# Patient Record
Sex: Female | Born: 1935 | Hispanic: No | Marital: Single | Smoking: Former smoker
Health system: Southern US, Community
[De-identification: ages and names within clinical notes are randomized; demographics above are authoritative.]

## PROBLEM LIST (undated history)

## (undated) DIAGNOSIS — I350 Nonrheumatic aortic (valve) stenosis: Secondary | ICD-10-CM

## (undated) DIAGNOSIS — D649 Anemia, unspecified: Secondary | ICD-10-CM

## (undated) DIAGNOSIS — I5030 Unspecified diastolic (congestive) heart failure: Secondary | ICD-10-CM

## (undated) DIAGNOSIS — E118 Type 2 diabetes mellitus with unspecified complications: Secondary | ICD-10-CM

## (undated) DIAGNOSIS — I10 Essential (primary) hypertension: Secondary | ICD-10-CM

## (undated) DIAGNOSIS — M1990 Unspecified osteoarthritis, unspecified site: Secondary | ICD-10-CM

## (undated) DIAGNOSIS — Z8711 Personal history of peptic ulcer disease: Secondary | ICD-10-CM

## (undated) DIAGNOSIS — Z8719 Personal history of other diseases of the digestive system: Secondary | ICD-10-CM

## (undated) DIAGNOSIS — J45909 Unspecified asthma, uncomplicated: Secondary | ICD-10-CM

## (undated) DIAGNOSIS — C679 Malignant neoplasm of bladder, unspecified: Secondary | ICD-10-CM

## (undated) DIAGNOSIS — I2510 Atherosclerotic heart disease of native coronary artery without angina pectoris: Secondary | ICD-10-CM

## (undated) DIAGNOSIS — J8410 Pulmonary fibrosis, unspecified: Secondary | ICD-10-CM

## (undated) HISTORY — PX: CAROTID ENDARTERECTOMY: SUR193

## (undated) HISTORY — PX: CHOLECYSTECTOMY: SHX55

## (undated) HISTORY — PX: EYE SURGERY: SHX253

## (undated) HISTORY — PX: CARDIAC VALVE REPLACEMENT: SHX585

## (undated) HISTORY — PX: CORONARY ARTERY BYPASS GRAFT: SHX141

## (undated) HISTORY — PX: AORTIC VALVE REPLACEMENT (AVR)/CORONARY ARTERY BYPASS GRAFTING (CABG): SHX5725

---

## 2003-03-08 ENCOUNTER — Other Ambulatory Visit: Payer: Self-pay

## 2004-03-08 ENCOUNTER — Ambulatory Visit: Payer: Self-pay | Admitting: Internal Medicine

## 2004-08-10 ENCOUNTER — Emergency Department: Payer: Self-pay | Admitting: Emergency Medicine

## 2005-02-28 ENCOUNTER — Ambulatory Visit: Payer: Self-pay | Admitting: Internal Medicine

## 2006-06-14 ENCOUNTER — Ambulatory Visit: Payer: Self-pay | Admitting: Internal Medicine

## 2006-09-13 ENCOUNTER — Ambulatory Visit: Payer: Self-pay | Admitting: Internal Medicine

## 2006-11-15 ENCOUNTER — Inpatient Hospital Stay: Payer: Self-pay | Admitting: Vascular Surgery

## 2007-03-18 ENCOUNTER — Other Ambulatory Visit: Payer: Self-pay

## 2007-03-18 ENCOUNTER — Emergency Department: Payer: Self-pay | Admitting: Emergency Medicine

## 2008-01-09 ENCOUNTER — Ambulatory Visit: Payer: Self-pay | Admitting: Ophthalmology

## 2008-01-09 ENCOUNTER — Ambulatory Visit: Payer: Self-pay | Admitting: Cardiology

## 2008-01-21 ENCOUNTER — Ambulatory Visit: Payer: Self-pay | Admitting: Ophthalmology

## 2008-03-01 ENCOUNTER — Emergency Department: Payer: Self-pay | Admitting: Emergency Medicine

## 2008-03-04 ENCOUNTER — Ambulatory Visit: Payer: Self-pay | Admitting: Unknown Physician Specialty

## 2008-03-05 ENCOUNTER — Inpatient Hospital Stay: Payer: Self-pay | Admitting: Unknown Physician Specialty

## 2008-04-06 ENCOUNTER — Emergency Department: Payer: Self-pay | Admitting: Emergency Medicine

## 2008-06-08 ENCOUNTER — Emergency Department: Payer: Self-pay | Admitting: Emergency Medicine

## 2008-09-08 ENCOUNTER — Emergency Department: Payer: Self-pay | Admitting: Emergency Medicine

## 2009-04-06 ENCOUNTER — Emergency Department: Payer: Self-pay | Admitting: Emergency Medicine

## 2009-11-04 ENCOUNTER — Emergency Department: Payer: Self-pay | Admitting: Emergency Medicine

## 2011-06-09 ENCOUNTER — Emergency Department: Payer: Self-pay | Admitting: *Deleted

## 2011-06-09 LAB — URINALYSIS, COMPLETE
Bacteria: NONE SEEN
Bilirubin,UR: NEGATIVE
Ketone: NEGATIVE
Nitrite: NEGATIVE
Ph: 6 (ref 4.5–8.0)
Protein: NEGATIVE
Specific Gravity: 1.004 (ref 1.003–1.030)
Squamous Epithelial: <1
WBC UR: <1 /HPF (ref 0–5)

## 2011-06-09 LAB — COMPREHENSIVE METABOLIC PANEL
Alkaline Phosphatase: 145 U/L — ABNORMAL HIGH (ref 50–136)
Anion Gap: 8 (ref 7–16)
BUN: 16 mg/dL (ref 7–18)
Bilirubin,Total: 0.4 mg/dL (ref 0.2–1.0)
Calcium, Total: 8.8 mg/dL (ref 8.5–10.1)
Chloride: 101 mmol/L (ref 98–107)
Co2: 32 mmol/L (ref 21–32)
Creatinine: 0.87 mg/dL (ref 0.60–1.30)
EGFR (African American): >60
Osmolality: 283 (ref 275–301)
Potassium: 3.7 mmol/L (ref 3.5–5.1)
SGPT (ALT): 17 U/L
Total Protein: 8.3 g/dL — ABNORMAL HIGH (ref 6.4–8.2)

## 2011-06-09 LAB — CBC
HCT: 37.2 % (ref 35.0–47.0)
MCH: 29.6 pg (ref 26.0–34.0)
Platelet: 96 10*3/uL — ABNORMAL LOW (ref 150–440)
RBC: 4.1 10*6/uL (ref 3.80–5.20)
WBC: 8.2 10*3/uL (ref 3.6–11.0)

## 2011-11-14 ENCOUNTER — Ambulatory Visit: Payer: Self-pay | Admitting: Ophthalmology

## 2011-11-28 ENCOUNTER — Ambulatory Visit: Payer: Self-pay | Admitting: Ophthalmology

## 2012-01-05 ENCOUNTER — Emergency Department: Payer: Self-pay | Admitting: Emergency Medicine

## 2012-01-05 LAB — COMPREHENSIVE METABOLIC PANEL
Anion Gap: 10 (ref 7–16)
BUN: 20 mg/dL — ABNORMAL HIGH (ref 7–18)
Calcium, Total: 7.8 mg/dL — ABNORMAL LOW (ref 8.5–10.1)
Creatinine: 1.23 mg/dL (ref 0.60–1.30)
EGFR (African American): 46 — ABNORMAL LOW
Glucose: 181 mg/dL — ABNORMAL HIGH (ref 65–99)
Potassium: 3.8 mmol/L (ref 3.5–5.1)
SGPT (ALT): 16 U/L (ref 12–78)
Sodium: 135 mmol/L — ABNORMAL LOW (ref 136–145)

## 2012-01-05 LAB — CBC
HGB: 10.9 g/dL — ABNORMAL LOW (ref 12.0–16.0)
MCH: 29.3 pg (ref 26.0–34.0)
MCHC: 33.2 g/dL (ref 32.0–36.0)
MCV: 88 fL (ref 80–100)
Platelet: 101 10*3/uL — ABNORMAL LOW (ref 150–440)
RDW: 15.7 % — ABNORMAL HIGH (ref 11.5–14.5)

## 2014-04-22 NOTE — Op Note (Signed)
PATIENT NAME:  Ariana Miller, Ariana Miller MR#:  [id number] DATE OF BIRTH:  1924/01/01  DATE OF PROCEDURE:  11/28/2011  PREOPERATIVE DIAGNOSIS:  Cataract, left eye.    POSTOPERATIVE DIAGNOSIS:  Cataract, left eye.  PROCEDURE PERFORMED:  Extracapsular cataract extraction using phacoemulsification with placement of an Alcon [id number], 23.5-diopter posterior chamber lens, serial #[id number].   SURGEON:  Loura Back. Clelia Trabucco, MD  ASSISTANT:  None.  ANESTHESIA:  4% lidocaine and 0.75% Marcaine in a 50/50 mixture with 10 units/mL of Hylenex  added given as a peribulbar.  ANESTHESIOLOGIST:  Elyse Hsu, MD   COMPLICATIONS:  None.  ESTIMATED BLOOD LOSS:  Less than 1 mL.  DESCRIPTION OF PROCEDURE:  The patient was brought to the operating room and given a peribulbar block.  The patient was then prepped and draped in the usual fashion.  The vertical rectus muscles were imbricated using 5-0 silk sutures.  These sutures were then clamped to the sterile drapes as bridle sutures.  A limbal peritomy was performed extending two clock hours and hemostasis was obtained with cautery.  A partial thickness scleral groove was made at the surgical limbus and dissected anteriorly in a lamellar dissection using an Alcon crescent knife.  The anterior chamber was entered supero-temporally with a Superblade and through the lamellar dissection with a 2.6 mm keratome.  DisCoVisc was used to replace the aqueous and a continuous tear capsulorrhexis was carried out.  Hydrodissection and hydrodelineation were carried out with balanced salt and a 27 gauge canula.  The nucleus was rotated to confirm the effectiveness of the hydrodissection.  Phacoemulsification was carried out using a divide-and-conquer technique.  Total ultrasound time was one minute and 30 seconds with an average power of 22.4 percent. CDE of 38.15.   Irrigation/aspiration was used to remove the residual cortex.  DisCoVisc was used to inflate the capsule and the internal  incision was enlarged to 3 mm with the crescent knife.  The intraocular lens was folded and inserted into the capsular bag using the AcrySert Delivery System.   Irrigation/aspiration was used to remove the residual DisCoVisc.  Miostat was injected into the anterior chamber through the paracentesis track to inflate the anterior chamber and induce miosis.  The wound was checked for leaks and none were found. The conjunctiva was closed with cautery and the bridle sutures were removed.  Two drops of 0.3% Vigamox were placed on the eye.   An eye shield was placed on the eye.  The patient was discharged to the recovery room in good condition.   ____________________________ Loura Back Orla Estrin, MD sad:cbb D: 11/28/2011 14:40:51 ET T: 11/28/2011 16:54:55 ET JOB#: [id number]  cc: Remo Lipps A. Seairra Otani, MD, <Dictator> Martie Lee MD ELECTRONICALLY SIGNED 12/05/2011 13:11

## 2015-02-26 DIAGNOSIS — E782 Mixed hyperlipidemia: Secondary | ICD-10-CM | POA: Insufficient documentation

## 2015-03-02 ENCOUNTER — Emergency Department: Payer: Medicare Other

## 2015-03-02 ENCOUNTER — Observation Stay
Admission: EM | Admit: 2015-03-02 | Discharge: 2015-03-05 | Disposition: A | Discharge status: Home / Self Care | Payer: Medicare Other | Attending: Internal Medicine | Admitting: Internal Medicine

## 2015-03-02 DIAGNOSIS — R778 Other specified abnormalities of plasma proteins: Secondary | ICD-10-CM

## 2015-03-02 DIAGNOSIS — J989 Respiratory disorder, unspecified: Secondary | ICD-10-CM | POA: Diagnosis present

## 2015-03-02 DIAGNOSIS — R918 Other nonspecific abnormal finding of lung field: Secondary | ICD-10-CM | POA: Diagnosis not present

## 2015-03-02 DIAGNOSIS — J40 Bronchitis, not specified as acute or chronic: Secondary | ICD-10-CM

## 2015-03-02 DIAGNOSIS — R0989 Other specified symptoms and signs involving the circulatory and respiratory systems: Secondary | ICD-10-CM | POA: Diagnosis present

## 2015-03-02 DIAGNOSIS — J988 Other specified respiratory disorders: Principal | ICD-10-CM | POA: Insufficient documentation

## 2015-03-02 DIAGNOSIS — R7989 Other specified abnormal findings of blood chemistry: Secondary | ICD-10-CM

## 2015-03-02 DIAGNOSIS — J069 Acute upper respiratory infection, unspecified: Secondary | ICD-10-CM | POA: Diagnosis not present

## 2015-03-02 DIAGNOSIS — R05 Cough: Secondary | ICD-10-CM | POA: Insufficient documentation

## 2015-03-02 DIAGNOSIS — E119 Type 2 diabetes mellitus without complications: Secondary | ICD-10-CM | POA: Diagnosis not present

## 2015-03-02 DIAGNOSIS — I2510 Atherosclerotic heart disease of native coronary artery without angina pectoris: Secondary | ICD-10-CM | POA: Diagnosis not present

## 2015-03-02 DIAGNOSIS — Z7984 Long term (current) use of oral hypoglycemic drugs: Secondary | ICD-10-CM | POA: Diagnosis not present

## 2015-03-02 DIAGNOSIS — Z7982 Long term (current) use of aspirin: Secondary | ICD-10-CM | POA: Diagnosis not present

## 2015-03-02 DIAGNOSIS — Z87891 Personal history of nicotine dependence: Secondary | ICD-10-CM | POA: Insufficient documentation

## 2015-03-02 DIAGNOSIS — Z951 Presence of aortocoronary bypass graft: Secondary | ICD-10-CM | POA: Insufficient documentation

## 2015-03-02 DIAGNOSIS — R748 Abnormal levels of other serum enzymes: Secondary | ICD-10-CM | POA: Insufficient documentation

## 2015-03-02 DIAGNOSIS — E871 Hypo-osmolality and hyponatremia: Secondary | ICD-10-CM | POA: Diagnosis present

## 2015-03-02 DIAGNOSIS — I10 Essential (primary) hypertension: Secondary | ICD-10-CM | POA: Diagnosis not present

## 2015-03-02 DIAGNOSIS — Z952 Presence of prosthetic heart valve: Secondary | ICD-10-CM | POA: Insufficient documentation

## 2015-03-02 DIAGNOSIS — J45901 Unspecified asthma with (acute) exacerbation: Secondary | ICD-10-CM

## 2015-03-02 HISTORY — DX: Unspecified asthma, uncomplicated: J45.909

## 2015-03-02 HISTORY — DX: Essential (primary) hypertension: I10

## 2015-03-02 HISTORY — DX: Atherosclerotic heart disease of native coronary artery without angina pectoris: I25.10

## 2015-03-02 LAB — CBC WITH DIFFERENTIAL/PLATELET
Basophils Absolute: 0.1 10*3/uL (ref 0–0.1)
Basophils Relative: 1 %
Eosinophils Absolute: 0.3 10*3/uL (ref 0–0.7)
Eosinophils Relative: 3 %
HEMATOCRIT: 34.5 % — AB (ref 35.0–47.0)
Hemoglobin: 11.6 g/dL — ABNORMAL LOW (ref 12.0–16.0)
Lymphs Abs: 1.3 10*3/uL (ref 1.0–3.6)
MCH: 29.6 pg (ref 26.0–34.0)
MCHC: 33.8 g/dL (ref 32.0–36.0)
MCV: 87.7 fL (ref 80.0–100.0)
MONO ABS: 1.5 10*3/uL — AB (ref 0.2–0.9)
NEUTROS ABS: 7.4 10*3/uL — AB (ref 1.4–6.5)
Neutrophils Relative %: 69 %
PLATELETS: 97 10*3/uL — AB (ref 150–440)
RBC: 3.93 MIL/uL (ref 3.80–5.20)
RDW: 15.3 % — AB (ref 11.5–14.5)
WBC: 10.6 10*3/uL (ref 3.6–11.0)

## 2015-03-02 LAB — COMPREHENSIVE METABOLIC PANEL
ALT: 19 U/L (ref 14–54)
ANION GAP: 10 (ref 5–15)
AST: 30 U/L (ref 15–41)
Albumin: 3.4 g/dL — ABNORMAL LOW (ref 3.5–5.0)
Alkaline Phosphatase: 105 U/L (ref 38–126)
BILIRUBIN TOTAL: 0.9 mg/dL (ref 0.3–1.2)
BUN: 15 mg/dL (ref 6–20)
CHLORIDE: 91 mmol/L — AB (ref 101–111)
CO2: 26 mmol/L (ref 22–32)
Calcium: 8.1 mg/dL — ABNORMAL LOW (ref 8.9–10.3)
Creatinine, Ser: 0.81 mg/dL (ref 0.44–1.00)
Glucose, Bld: 183 mg/dL — ABNORMAL HIGH (ref 65–99)
POTASSIUM: 3.7 mmol/L (ref 3.5–5.1)
Sodium: 127 mmol/L — ABNORMAL LOW (ref 135–145)
TOTAL PROTEIN: 7.9 g/dL (ref 6.5–8.1)

## 2015-03-02 LAB — TROPONIN I: TROPONIN I: 0.04 ng/mL — AB (ref <0.031)

## 2015-03-02 LAB — GLUCOSE, CAPILLARY: GLUCOSE-CAPILLARY: 175 mg/dL — AB (ref 65–99)

## 2015-03-02 MED ORDER — IPRATROPIUM-ALBUTEROL 0.5-2.5 (3) MG/3ML IN SOLN
3.0000 mL | Freq: Once | RESPIRATORY_TRACT | Status: AC
  Administered 2015-03-02: 3 mL via RESPIRATORY_TRACT
  Filled 2015-03-02: qty 3

## 2015-03-02 MED ORDER — METHYLPREDNISOLONE SODIUM SUCC 125 MG IJ SOLR
125.0000 mg | Freq: Once | INTRAMUSCULAR | Status: AC
  Administered 2015-03-02: 125 mg via INTRAVENOUS
  Filled 2015-03-02: qty 2

## 2015-03-02 NOTE — ED Notes (Signed)
Pt arrived to ED with family with reports of shortness of breath with cough. Pt daughter reports seen at PMD today with diagnosis of Bronchitis. Pt daughter reports fever of 99.5 at home, Motrin given at 6pm. Pt c/o shortness of breath with congested cough.

## 2015-03-02 NOTE — ED Provider Notes (Addendum)
[facility] Emergency Department Provider Note     Time seen: ----------------------------------------- 10:26 PM on 03/02/2015 -----------------------------------------    I have reviewed the triage vital signs and the nursing notes.   HISTORY  Chief Complaint Cough    HPI Ariana Miller is a 82 y.o. female who presents to ER for shortness of breath and cough. Daughter reports she was seen by her primary care doctor twice in the last week and diagnosed with bronchitis. There are reports she's had a fever at home, received some Motrin prior to arrival. Patient claims her shortness of breath associated with cough, has finished a Z-Pak without any improvement. Nothing is made her symptoms better. She does have a remote history of asthma but does not use inhalers any more.   Past Medical History  Diagnosis Date  . Diabetes mellitus without complication ([place])   . Asthma     There are no active problems to display for this patient.   Past Surgical History  Procedure Laterality Date  . Cardiac surgery      Allergies Review of patient's allergies indicates no known allergies.  Social History Social History  Substance Use Topics  . Smoking status: Never Smoker   . Smokeless tobacco: Not on file  . Alcohol Use: No    Review of Systems Constitutional: Positive for fever Eyes: Negative for visual changes. ENT: Negative for sore throat. Cardiovascular: Negative for chest pain. Respiratory: Positive for shortness of breath and cough Gastrointestinal: Negative for abdominal pain, vomiting and diarrhea. Genitourinary: Negative for dysuria. Musculoskeletal: Negative for back pain. Skin: Negative for rash. Neurological: Negative for headaches, positive for weakness  10-point ROS otherwise negative.  ____________________________________________   PHYSICAL EXAM:  VITAL SIGNS: ED Triage Vitals  Enc Vitals Group     BP 03/02/15 2101 195/87  mmHg     Pulse Rate 03/02/15 2101 92     Resp 03/02/15 2101 18     Temp 03/02/15 2101 98.7 F (37.1 C)     Temp Source 03/02/15 2101 Oral     SpO2 03/02/15 2101 99 %     Weight 03/02/15 2101 154 lb (69.854 kg)     Height 03/02/15 2101 5' (1.524 m)     Head Cir --      Peak Flow --      Pain Score --      Pain Loc --      Pain Edu? --      Excl. in [place]? --     Constitutional: Alert and oriented. Mild distress Eyes: Conjunctivae are normal. PERRL. Normal extraocular movements. ENT   Head: Normocephalic and atraumatic.   Nose: No congestion/rhinnorhea.   Mouth/Throat: Mucous membranes are moist.   Neck: No stridor. Cardiovascular: Normal rate, regular rhythm. Normal and symmetric distal pulses are present in all extremities. No murmurs, rubs, or gallops. Respiratory: Normal respiratory effort without tachypnea nor retractions, bilateral wheezing with scattered rhonchi Gastrointestinal: Soft and nontender. No distention. No abdominal bruits.  Musculoskeletal: Nontender with normal range of motion in all extremities.  Neurologic:  Normal speech and language. No gross focal neurologic deficits are appreciated.  Skin:  Skin is warm, dry and intact. No rash noted. ____________________________________________  EKG: Interpreted by me. Normal sinus rhythm with a rate of 91 bpm, normal PR interval, wide QRS, right bundle branch block, long QT interval. Normal axis.  ____________________________________________  ED COURSE:  Pertinent labs & imaging results that were available during my care of the patient were  reviewed by me and considered in my medical decision making (see chart for details). Patient appears short of breath and has failed outpatient treatment for this twice. She is receiving a DuoNeb and Solu-Medrol. I will recheck labs and obtain chest x-ray. ____________________________________________    LABS (pertinent positives/negatives)  Labs Reviewed  GLUCOSE,  CAPILLARY - Abnormal; Notable for the following:    Glucose-Capillary 175 (*)    All other components within normal limits  CBC WITH DIFFERENTIAL/PLATELET - Abnormal; Notable for the following:    Hemoglobin 11.6 (*)    HCT 34.5 (*)    RDW 15.3 (*)    Platelets 97 (*)    Neutro Abs 7.4 (*)    Monocytes Absolute 1.5 (*)    All other components within normal limits  COMPREHENSIVE METABOLIC PANEL - Abnormal; Notable for the following:    Sodium 127 (*)    Chloride 91 (*)    Glucose, Bld 183 (*)    Calcium 8.1 (*)    Albumin 3.4 (*)    All other components within normal limits  TROPONIN I - Abnormal; Notable for the following:    Troponin I 0.04 (*)    All other components within normal limits    RADIOLOGY Images were viewed by me  IMPRESSION: 1. No acute cardiopulmonary disease. 2. 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   ____________________________________________  FINAL ASSESSMENT AND PLAN  Bronchitis, asthma exacerbation, elevated troponin I, hyponatremia  Plan: Patient with labs and imaging as dictated above. Patient was some mild laboratory abnormalities including hyponatremia and mildly elevated troponin. She has failed outpatient treatment for this, would benefit from continued breathing treatments and steroids. She is in no acute distress at this time.   Earleen Newport, MD   Earleen Newport, MD 03/02/15 [id number]  Earleen Newport, MD 03/02/15 2230

## 2015-03-03 DIAGNOSIS — J988 Other specified respiratory disorders: Secondary | ICD-10-CM | POA: Diagnosis not present

## 2015-03-03 LAB — BASIC METABOLIC PANEL
Anion gap: 6 (ref 5–15)
BUN: 12 mg/dL (ref 6–20)
CHLORIDE: 98 mmol/L — AB (ref 101–111)
CO2: 27 mmol/L (ref 22–32)
Calcium: 8 mg/dL — ABNORMAL LOW (ref 8.9–10.3)
Creatinine, Ser: 0.72 mg/dL (ref 0.44–1.00)
Glucose, Bld: 284 mg/dL — ABNORMAL HIGH (ref 65–99)
POTASSIUM: 3.6 mmol/L (ref 3.5–5.1)
SODIUM: 131 mmol/L — AB (ref 135–145)

## 2015-03-03 LAB — GLUCOSE, CAPILLARY
GLUCOSE-CAPILLARY: 245 mg/dL — AB (ref 65–99)
GLUCOSE-CAPILLARY: 261 mg/dL — AB (ref 65–99)
GLUCOSE-CAPILLARY: 264 mg/dL — AB (ref 65–99)
GLUCOSE-CAPILLARY: 210 mg/dL — AB (ref 65–99)
Glucose-Capillary: 237 mg/dL — ABNORMAL HIGH (ref 65–99)
Glucose-Capillary: 183 mg/dL — ABNORMAL HIGH (ref 65–99)

## 2015-03-03 LAB — CBC
HEMATOCRIT: 33.9 % — AB (ref 35.0–47.0)
Hemoglobin: 11.3 g/dL — ABNORMAL LOW (ref 12.0–16.0)
MCH: 29 pg (ref 26.0–34.0)
MCHC: 33.2 g/dL (ref 32.0–36.0)
MCV: 87.4 fL (ref 80.0–100.0)
PLATELETS: 99 10*3/uL — AB (ref 150–440)
RBC: 3.88 MIL/uL (ref 3.80–5.20)
RDW: 15.1 % — AB (ref 11.5–14.5)
WBC: 7.5 10*3/uL (ref 3.6–11.0)

## 2015-03-03 LAB — TROPONIN I
Troponin I: 0.06 ng/mL — ABNORMAL HIGH (ref <0.031)
Troponin I: 0.03 ng/mL (ref <0.031)

## 2015-03-03 MED ORDER — METHYLPREDNISOLONE SODIUM SUCC 125 MG IJ SOLR
60.0000 mg | Freq: Four times a day (QID) | INTRAMUSCULAR | Status: DC
  Administered 2015-03-03 (×2): 60 mg via INTRAVENOUS
  Filled 2015-03-03 (×2): qty 2

## 2015-03-03 MED ORDER — ACETAMINOPHEN 325 MG PO TABS
650.0000 mg | ORAL_TABLET | Freq: Four times a day (QID) | ORAL | Status: DC | PRN
Start: 2015-03-03 — End: 2015-03-05

## 2015-03-03 MED ORDER — LISINOPRIL 10 MG PO TABS
10.0000 mg | ORAL_TABLET | Freq: Every day | ORAL | Status: DC
  Administered 2015-03-03 – 2015-03-05 (×3): 10 mg via ORAL
  Filled 2015-03-03 (×3): qty 1

## 2015-03-03 MED ORDER — DONEPEZIL HCL 5 MG PO TABS
10.0000 mg | ORAL_TABLET | Freq: Every day | ORAL | Status: DC
  Administered 2015-03-03 – 2015-03-04 (×3): 10 mg via ORAL
  Filled 2015-03-03 (×3): qty 2

## 2015-03-03 MED ORDER — ONDANSETRON HCL 4 MG PO TABS: 4.0000 mg | ORAL_TABLET | Freq: Four times a day (QID) | ORAL | Status: DC | PRN

## 2015-03-03 MED ORDER — METHYLPREDNISOLONE SODIUM SUCC 125 MG IJ SOLR
60.0000 mg | INTRAMUSCULAR | Status: DC
  Administered 2015-03-04 – 2015-03-05 (×2): 60 mg via INTRAVENOUS
  Filled 2015-03-03 (×2): qty 2

## 2015-03-03 MED ORDER — IPRATROPIUM-ALBUTEROL 0.5-2.5 (3) MG/3ML IN SOLN
3.0000 mL | RESPIRATORY_TRACT | Status: DC
  Administered 2015-03-03: 03:00:00 3 mL via RESPIRATORY_TRACT
  Filled 2015-03-03 (×3): qty 3

## 2015-03-03 MED ORDER — SODIUM CHLORIDE 0.9 % IV SOLN
INTRAVENOUS | Status: DC
  Administered 2015-03-03: 02:00:00 via INTRAVENOUS

## 2015-03-03 MED ORDER — HYDRALAZINE HCL 20 MG/ML IJ SOLN
10.0000 mg | INTRAMUSCULAR | Status: DC | PRN
  Administered 2015-03-03: 10 mg via INTRAVENOUS
  Filled 2015-03-03: qty 1

## 2015-03-03 MED ORDER — ACETAMINOPHEN 650 MG RE SUPP: 650.0000 mg | Freq: Four times a day (QID) | RECTAL | Status: DC | PRN

## 2015-03-03 MED ORDER — ASPIRIN 81 MG PO CHEW
81.0000 mg | CHEWABLE_TABLET | Freq: Every day | ORAL | Status: DC
  Administered 2015-03-03 – 2015-03-05 (×3): 81 mg via ORAL
  Filled 2015-03-03 (×2): qty 1

## 2015-03-03 MED ORDER — INSULIN ASPART 100 UNIT/ML ~~LOC~~ SOLN
0.0000 [IU] | Freq: Every day | SUBCUTANEOUS | Status: DC
  Administered 2015-03-03: 3 [IU] via SUBCUTANEOUS
  Administered 2015-03-03: 2 [IU] via SUBCUTANEOUS
  Filled 2015-03-03: qty 2

## 2015-03-03 MED ORDER — LABETALOL HCL 5 MG/ML IV SOLN
10.0000 mg | INTRAVENOUS | Status: DC | PRN
  Administered 2015-03-03: 10 mg via INTRAVENOUS
  Filled 2015-03-03 (×2): qty 4

## 2015-03-03 MED ORDER — ONDANSETRON HCL 4 MG/2ML IJ SOLN
4.0000 mg | Freq: Four times a day (QID) | INTRAMUSCULAR | Status: DC | PRN
  Administered 2015-03-03 – 2015-03-05 (×6): 4 mg via INTRAVENOUS
  Filled 2015-03-03 (×6): qty 2

## 2015-03-03 MED ORDER — INSULIN ASPART 100 UNIT/ML ~~LOC~~ SOLN
0.0000 [IU] | Freq: Three times a day (TID) | SUBCUTANEOUS | Status: DC
  Administered 2015-03-03: 08:00:00 5 [IU] via SUBCUTANEOUS
  Administered 2015-03-03: 17:00:00 2 [IU] via SUBCUTANEOUS
  Administered 2015-03-03: 13:00:00 5 [IU] via SUBCUTANEOUS
  Administered 2015-03-04 (×2): 3 [IU] via SUBCUTANEOUS
  Filled 2015-03-03: qty 2
  Filled 2015-03-03: qty 5
  Filled 2015-03-03 (×3): qty 3
  Filled 2015-03-03: qty 5

## 2015-03-03 MED ORDER — HYDROCHLOROTHIAZIDE 12.5 MG PO CAPS
12.5000 mg | ORAL_CAPSULE | Freq: Every day | ORAL | Status: DC
  Administered 2015-03-03 – 2015-03-05 (×3): 12.5 mg via ORAL
  Filled 2015-03-03 (×3): qty 1

## 2015-03-03 MED ORDER — LEVALBUTEROL HCL 0.63 MG/3ML IN NEBU
0.6300 mg | INHALATION_SOLUTION | RESPIRATORY_TRACT | Status: DC | PRN
  Administered 2015-03-04 – 2015-03-05 (×6): 0.63 mg via RESPIRATORY_TRACT
  Filled 2015-03-03 (×6): qty 3

## 2015-03-03 MED ORDER — GUAIFENESIN-CODEINE 100-10 MG/5ML PO SOLN
10.0000 mL | ORAL | Status: DC | PRN
  Administered 2015-03-03 – 2015-03-05 (×5): 10 mL via ORAL
  Filled 2015-03-03 (×5): qty 10

## 2015-03-03 MED ORDER — ENOXAPARIN SODIUM 40 MG/0.4ML ~~LOC~~ SOLN
40.0000 mg | Freq: Every day | SUBCUTANEOUS | Status: DC
  Administered 2015-03-03 – 2015-03-04 (×3): 40 mg via SUBCUTANEOUS
  Filled 2015-03-03 (×3): qty 0.4

## 2015-03-03 MED ORDER — SODIUM CHLORIDE 0.9% FLUSH
3.0000 mL | Freq: Two times a day (BID) | INTRAVENOUS | Status: DC
  Administered 2015-03-03 – 2015-03-05 (×5): 3 mL via INTRAVENOUS

## 2015-03-03 MED ORDER — LISINOPRIL-HYDROCHLOROTHIAZIDE 10-12.5 MG PO TABS: 1.0000 | ORAL_TABLET | Freq: Every day | ORAL | Status: DC

## 2015-03-03 NOTE — Progress Notes (Signed)
BP elevated 178/55 - unable to give PRN Labetalol due to HR at 58. Will continue to monitor BP. Madlyn Frankel, RN

## 2015-03-03 NOTE — H&P (Signed)
[place] [facility] [place] NAME: Ariana Miller    MR#:  [id number]  DATE OF BIRTH:  February 25, 1923  DATE OF ADMISSION:  03/02/2015  PRIMARY CARE PHYSICIAN: Rusty Aus., MD   REQUESTING/REFERRING PHYSICIAN: Jimmye Norman, MD  CHIEF COMPLAINT:   Chief Complaint  Patient presents with  . Cough    HISTORY OF PRESENT ILLNESS:  Ariana Miller  is a 82 y.o. female who presents with asthma exacerbation. Patient has a remote history of asthma, has not been on any home inhalers recently. She developed URI symptoms about a week ago, and then again the have some respiratory wheezing. She went to see her outpatient physician who diagnosed her with bronchitis and gave her a Z-Pak. She feels that she may improve slightly, but then tonight developed a significant bout of shortness of breath and severe wheezing. She came to the ED and was felt to be an asthma exacerbation. She was given IV steroids and nebulizers with improvement in her respiratory symptoms. However, despite treatment she had some persistent wheezing on exam and had not returned closer to baseline to feel comfortable being discharged from the ED. Hospitals were called for admission.  PAST MEDICAL HISTORY:   Past Medical History  Diagnosis Date  . Diabetes mellitus without complication ([place])   . Asthma   . HTN (hypertension)   . CAD (coronary artery disease)     PAST SURGICAL HISTORY:   Past Surgical History  Procedure Laterality Date  . Aortic valve replacement (avr)/coronary artery bypass grafting (cabg)    . Cholecystectomy    . Carotid endarterectomy      SOCIAL HISTORY:   Social History  Substance Use Topics  . Smoking status: Never Smoker   . Smokeless tobacco: Not on file  . Alcohol Use: No    FAMILY HISTORY:   Family History  Problem Relation Age of Onset  . Hepatitis Mother   . Liver disease Father   . Asthma    . Osteoporosis    . Hypertension      DRUG  ALLERGIES:  No Known Allergies  MEDICATIONS AT HOME:   Prior to Admission medications   Medication Sig Start Date End Date Taking? Authorizing Provider  aspirin 81 MG chewable tablet Chew 81 mg by mouth daily.   Yes Historical Provider, MD  donepezil (ARICEPT) 10 MG tablet Take 10 mg by mouth at bedtime.   Yes Historical Provider, MD  glimepiride (AMARYL) 1 MG tablet Take 0.5 mg by mouth daily.   Yes Historical Provider, MD  lisinopril-hydrochlorothiazide (PRINZIDE,ZESTORETIC) 10-12.5 MG tablet Take 1 tablet by mouth daily.   Yes Historical Provider, MD    REVIEW OF SYSTEMS:  Review of Systems  Constitutional: Negative for fever, chills, weight loss and malaise/fatigue.  HENT: Negative for ear pain, hearing loss and tinnitus.   Eyes: Negative for blurred vision, double vision, pain and redness.  Respiratory: Positive for cough, shortness of breath and wheezing. Negative for hemoptysis.   Cardiovascular: Negative for chest pain, palpitations, orthopnea and leg swelling.  Gastrointestinal: Negative for nausea, vomiting, abdominal pain, diarrhea and constipation.  Genitourinary: Negative for dysuria, frequency and hematuria.  Musculoskeletal: Negative for back pain, joint pain and neck pain.  Skin:       No acne, rash, or lesions  Neurological: Negative for dizziness, tremors, focal weakness and weakness.  Endo/Heme/Allergies: Negative for polydipsia. Does not bruise/bleed easily.  Psychiatric/Behavioral: Negative for depression. The patient is not nervous/anxious and does not have  insomnia.      VITAL SIGNS:   Filed Vitals:   03/02/15 2101 03/02/15 2230 03/02/15 2330  BP: 195/87 216/57 199/87  Pulse: 92 92 87  Temp: 98.7 F (37.1 C)    TempSrc: Oral    Resp: 18  17  Height: 5' (1.524 m)    Weight: 69.854 kg (154 lb)    SpO2: 99% 98% 98%   Wt Readings from Last 3 Encounters:  03/02/15 69.854 kg (154 lb)    PHYSICAL EXAMINATION:  Physical Exam  Vitals  reviewed. Constitutional: She is oriented to person, place, and time. She appears well-developed and well-nourished. No distress.  HENT:  Head: Normocephalic and atraumatic.  Mouth/Throat: Oropharynx is clear and moist.  Eyes: Conjunctivae and EOM are normal. Pupils are equal, round, and reactive to light. No scleral icterus.  Neck: Normal range of motion. Neck supple. No JVD present. No thyromegaly present.  Cardiovascular: Normal rate, regular rhythm and intact distal pulses.  Exam reveals no gallop and no friction rub.   No murmur heard. Respiratory: Effort normal. No respiratory distress. She has wheezes. She has no rales.  GI: Soft. Bowel sounds are normal. She exhibits no distension. There is no tenderness.  Musculoskeletal: Normal range of motion. She exhibits no edema.  No arthritis, no gout  Lymphadenopathy:    She has no cervical adenopathy.  Neurological: She is alert and oriented to person, place, and time. No cranial nerve deficit.  No dysarthria, no aphasia  Skin: Skin is warm and dry. No rash noted. No erythema.  Psychiatric: She has a normal mood and affect. Her behavior is normal. Judgment and thought content normal.    LABORATORY PANEL:   CBC  Recent Labs Lab 03/02/15 2116  WBC 10.6  HGB 11.6*  HCT 34.5*  PLT 97*   ------------------------------------------------------------------------------------------------------------------  Chemistries   Recent Labs Lab 03/02/15 2116  NA 127*  K 3.7  CL 91*  CO2 26  GLUCOSE 183*  BUN 15  CREATININE 0.81  CALCIUM 8.1*  AST 30  ALT 19  ALKPHOS 105  BILITOT 0.9   ------------------------------------------------------------------------------------------------------------------  Cardiac Enzymes  Recent Labs Lab 03/02/15 2116  TROPONINI 0.04*   ------------------------------------------------------------------------------------------------------------------  RADIOLOGY:  Dg Chest 2 View  03/02/2015   CLINICAL DATA:  Shortness of breath with cough. Recent diagnosis of bronchitis. EXAM: CHEST  2 VIEW COMPARISON:  01/05/2012 FINDINGS: Stable changes from cardiac surgery and valve replacement. No mediastinal or hilar masses or evidence of adenopathy. Cardiac silhouette is normal in size. Lungs show bilateral irregularly thickened interstitial markings that are without substantial change from the prior study. There is no convincing pneumonia and no evidence of pulmonary edema. No pleural effusion or pneumothorax. 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 Bony thorax is demineralized. Right shoulder prosthesis is well-seated and aligned. IMPRESSION: 1. No acute cardiopulmonary disease. 2. 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 Electronically Signed   By: Lajean Manes M.D.   On: 03/02/2015 21:39    EKG:   Orders placed or performed during the hospital encounter of 03/02/15  . ED EKG  . ED EKG    IMPRESSION AND PLAN:  Principal Problem:   Asthma exacerbation - likely due to her URI which may have progressed into bronchitis. good symptomatic improvement with IV steroids and nebulizers here. We will continue these on admission. Admit for observation, with expected improvement.  Active Problems:   Accelerated hypertension -  continue home meds, additional IV when necessary antihypertensives  to bring her down to goal of less than 160/100.    Hyponatremia -  mild, patient has had poor by mouth intake during this illness, suspect that this is the cause of her hyponatremia. We will start treatment with fluid hydration with normal saline.    Type 2 diabetes mellitus (HCC) - carb modified diet with sliding scale insulin and corresponding glucose checks before meals at bedtime   CAD (coronary artery disease) - continue home meds, troponin was very barely elevated at 0.04, we'll trend serially tonight. Suspect this is likely from her accelerated hypertension.   All the  records are reviewed and case discussed with ED provider. Management plans discussed with the patient and/or family.  DVT PROPHYLAXIS: SubQ lovenox  GI PROPHYLAXIS: None  ADMISSION STATUS: Observation  CODE STATUS: Full Code Status History    This patient does not have a recorded code status. Please follow your organizational policy for patients in this situation.      TOTAL TIME TAKING CARE OF THIS PATIENT: 45 minutes.    Ariana Miller [place] 03/03/2015, 12:04 AM  [facility]  Office  [phone]  CC: Primary care physician; Rusty Aus., MD

## 2015-03-03 NOTE — Care Management Note (Signed)
Case Management Note  Patient Details  Name: Ariana Miller MRN: [id number] Date of Birth: May 17, 1923  Subjective/Objective:                  Met with patient and her daughter. Patient slept the whole time and daughter states she does not speak [place]. Her PCP is Dr. Emily Filbert. Patient usually is able to ambulate with a cane. She does not have a nebulizer at home. She is not on O2 at home. She is currently on room air.She currently does not have a benefit payer for SNF.  Action/Plan: List of home health agencies left with daughter. RNCM will continue to follow.   Expected Discharge Date:                  Expected Discharge Plan:     In-House Referral:     Discharge planning Services  CM Consult  Post Acute Care Choice:    Choice offered to:  Patient  DME Arranged:    DME Agency:     HH Arranged:    [place] Agency:     Status of Service:  In process, will continue to follow  Medicare Important Message Given:    Date Medicare IM Given:    Medicare IM give by:    Date Additional Medicare IM Given:    Additional Medicare Important Message give by:     If discussed at [place] of Stay Meetings, dates discussed:    Additional Comments:  Marshell Garfinkel, RN 03/03/2015, 2:59 PM

## 2015-03-03 NOTE — ED Notes (Signed)
Report called to [facility], RN on 1C. Pt to be transported to room 120 via stretcher by ED tech.

## 2015-03-03 NOTE — Care Management Obs Status (Signed)
[place] NOTIFICATION   Patient Details  Name: Ariana Miller MRN: [id number] Date of Birth: [birth year]   Medicare Observation Status Notification Given:  Yes    Marshell Garfinkel, RN 03/03/2015, 2:00 PM

## 2015-03-03 NOTE — Progress Notes (Signed)
[place] at [place] NAME: Ariana Miller    MR#:  [id number]  DATE OF BIRTH:  [birth year]  SUBJECTIVE:  Daughter at bedside Some cough however breathing has improved States that she still gets short of breath walking and progressively weak  REVIEW OF SYSTEMS:  CONSTITUTIONAL: No fever, positive fatigue or weakness.  EYES: No blurred or double vision.  EARS, NOSE, AND THROAT: No tinnitus or ear pain.  RESPIRATORY: Positive cough, shortness of breath, denies wheezing or hemoptysis.  CARDIOVASCULAR: No chest pain, orthopnea, edema.  GASTROINTESTINAL: No nausea, vomiting, diarrhea or abdominal pain.  GENITOURINARY: No dysuria, hematuria.  ENDOCRINE: No polyuria, nocturia,  HEMATOLOGY: No anemia, easy bruising or bleeding SKIN: No rash or lesion. MUSCULOSKELETAL: No joint pain or arthritis.   NEUROLOGIC: No tingling, numbness, weakness.  PSYCHIATRY: No anxiety or depression.   DRUG ALLERGIES:  No Known Allergies  VITALS:  Blood pressure 167/71, pulse 72, temperature 97.6 F (36.4 C), temperature source Oral, resp. rate 18, height 5' (1.524 m), weight 68.856 kg (151 lb 12.8 oz), SpO2 97 %.  PHYSICAL EXAMINATION:  VITAL SIGNS: Filed Vitals:   03/03/15 0648 03/03/15 0813  BP: 170/56 167/71  Pulse: 70 72  Temp:    Resp:     GENERAL:82 y.o.female currently in no acute distress.  HEAD: Normocephalic, atraumatic.  EYES: Pupils equal, round, reactive to light. Extraocular muscles intact. No scleral icterus.  MOUTH: Moist mucosal membrane. Dentition intact. No abscess noted.  EAR, NOSE, THROAT: Clear without exudates. No external lesions.  NECK: Supple. No thyromegaly. No nodules. No JVD.  PULMONARY: Scant expiratory wheezing with fine crackles No use of accessory muscles, Good respiratory effort. good air entry bilaterally CHEST: Nontender to palpation.  CARDIOVASCULAR: S1 and S2. Regular rate and rhythm. No murmurs, rubs, or  gallops. No edema. Pedal pulses 2+ bilaterally.  GASTROINTESTINAL: Soft, nontender, nondistended. No masses. Positive bowel sounds. No hepatosplenomegaly.  MUSCULOSKELETAL: No swelling, clubbing, or edema. Range of motion full in all extremities.  NEUROLOGIC: Cranial nerves II through XII are intact. No gross focal neurological deficits. Sensation intact. Reflexes intact.  SKIN: No ulceration, lesions, rashes, or cyanosis. Skin warm and dry. Turgor intact.  PSYCHIATRIC: Mood, affect within normal limits. The patient is awake, alert and oriented x 3. Insight, judgment intact.      LABORATORY PANEL:   CBC  Recent Labs Lab 03/03/15 0911  WBC 7.5  HGB 11.3*  HCT 33.9*  PLT 99*   ------------------------------------------------------------------------------------------------------------------  Chemistries   Recent Labs Lab 03/02/15 2116 03/03/15 0911  NA 127* 131*  K 3.7 3.6  CL 91* 98*  CO2 26 27  GLUCOSE 183* 284*  BUN 15 12  CREATININE 0.81 0.72  CALCIUM 8.1* 8.0*  AST 30  --   ALT 19  --   ALKPHOS 105  --   BILITOT 0.9  --    ------------------------------------------------------------------------------------------------------------------  Cardiac Enzymes  Recent Labs Lab 03/03/15 0911  TROPONINI 0.03   ------------------------------------------------------------------------------------------------------------------  RADIOLOGY:  Dg Chest 2 View  03/02/2015  CLINICAL DATA:  Shortness of breath with cough. Recent diagnosis of bronchitis. EXAM: CHEST  2 VIEW COMPARISON:  01/05/2012 FINDINGS: Stable changes from cardiac surgery and valve replacement. No mediastinal or hilar masses or evidence of adenopathy. Cardiac silhouette is normal in size. Lungs show bilateral irregularly thickened interstitial markings that are without substantial change from the prior study. There is no convincing pneumonia and no evidence of pulmonary edema. No pleural effusion or  pneumothorax. 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 Bony thorax is demineralized. Right shoulder prosthesis is well-seated and aligned. IMPRESSION: 1. No acute cardiopulmonary disease. 2. 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 Electronically Signed   By: Lajean Manes M.D.   On: 03/02/2015 21:39    EKG:   Orders placed or performed during the hospital encounter of 03/02/15  . ED EKG  . ED EKG    ASSESSMENT AND PLAN:   82 year old female history of type 2 diabetes, essential hypertension presenting with shortness of breath and cough  1. Reactive airway disease not asthma: On x-ray patient appears to have evidence of pulmonary fibrosis she also has a smoking history per daughter at bedside. Given she has a wheezing will continue with steroids changed to Xopenex as she had adverse reaction described as jitteriness shakiness with albuterol 2. Dyspnea on exertion: Question this is simply age-related versus desaturations on ambulation will get physical therapy evaluation to see if she qualifies for supplemental oxygen 3. Type 2 diabetes non-insulin-requiring: Hold oral agents at insulin sliding scale 4. Essential hypertension: Continue lisinopril 5. Venous thromboembolism prophylactic Lovenox     All the records are reviewed and case discussed with Care Management/Social Workerr. Management plans discussed with the patient, family and they are in agreement.  CODE STATUS: Full  TOTAL TIME TAKING CARE OF THIS PATIENT: 33 minutes.   POSSIBLE D/C IN 1-2 DAYS, DEPENDING ON CLINICAL CONDITION.   Hower,  Karenann Cai.D on 03/03/2015 at 1:22 PM  Between 7am to 6pm - Pager - [phone]  After 6pm: House Pager: - [facility]  Office  [phone]  CC: Primary care physician; Rusty Aus., MD

## 2015-03-04 DIAGNOSIS — J988 Other specified respiratory disorders: Secondary | ICD-10-CM | POA: Diagnosis not present

## 2015-03-04 LAB — GLUCOSE, CAPILLARY
GLUCOSE-CAPILLARY: 212 mg/dL — AB (ref 65–99)
GLUCOSE-CAPILLARY: 221 mg/dL — AB (ref 65–99)
GLUCOSE-CAPILLARY: 264 mg/dL — AB (ref 65–99)
GLUCOSE-CAPILLARY: 152 mg/dL — AB (ref 65–99)

## 2015-03-04 MED ORDER — INSULIN ASPART 100 UNIT/ML ~~LOC~~ SOLN
0.0000 [IU] | Freq: Three times a day (TID) | SUBCUTANEOUS | Status: DC
  Administered 2015-03-04: 8 [IU] via SUBCUTANEOUS
  Administered 2015-03-05: 12:00:00 3 [IU] via SUBCUTANEOUS
  Administered 2015-03-05: 2 [IU] via SUBCUTANEOUS
  Filled 2015-03-04: qty 8
  Filled 2015-03-04: qty 2
  Filled 2015-03-04: qty 3

## 2015-03-04 MED ORDER — GLUCERNA SHAKE PO LIQD
237.0000 mL | Freq: Three times a day (TID) | ORAL | Status: DC
  Administered 2015-03-04 – 2015-03-05 (×4): 237 mL via ORAL

## 2015-03-04 MED ORDER — INSULIN ASPART 100 UNIT/ML ~~LOC~~ SOLN: 0.0000 [IU] | Freq: Every day | SUBCUTANEOUS | Status: DC

## 2015-03-04 MED ORDER — LEVOFLOXACIN 500 MG PO TABS
250.0000 mg | ORAL_TABLET | Freq: Every day | ORAL | Status: DC
  Administered 2015-03-04 – 2015-03-05 (×2): 250 mg via ORAL
  Filled 2015-03-04 (×2): qty 1

## 2015-03-04 NOTE — Progress Notes (Signed)
Inpatient Diabetes Program Recommendations  AACE/ADA: New Consensus Statement on Inpatient Glycemic Control (2015)  Target Ranges:  Prepandial:   less than 140 mg/dL      Peak postprandial:   less than 180 mg/dL (1-2 hours)      Critically ill patients:  140 - 180 mg/dL   Review of Glycemic Control: Results for KALAN, RANG (MRN [id number]) as of 03/04/2015 11:39  Ref. Range 03/03/2015 16:41 03/03/2015 21:31 03/04/2015 07:39 03/04/2015 11:21  Glucose-Capillary Latest Ref Range: 65-99 mg/dL 183 (H) 210 (H) 212 (H) 221 (H)    Diabetes history: Type 2 diabetes Outpatient Diabetes medications: Amaryl 0.5 mg daily Current orders for Inpatient glycemic control:  Novolog sensitive tid with meals and HS  Inpatient Diabetes Program Recommendations:    May consider increasing Novolog correction to moderate while patient is taking IV steroids.  Thanks, Adah Perl, RN, BC-ADM Inpatient Diabetes Coordinator Pager [phone] (8a-5p)

## 2015-03-04 NOTE — Progress Notes (Signed)
Initial Nutrition Assessment   INTERVENTION:   Meals and Snacks: Cater to patient preferences Medical Food Supplement Therapy: recommend Glucerna Shake po TID, each supplement provides 220 kcal and 10 grams of protein   NUTRITION DIAGNOSIS:   Inadequate oral intake related to poor appetite as evidenced by per patient/family report.  GOAL:   Patient will meet greater than or equal to 90% of their needs  MONITOR:    (energy Intake, Electrolyte and Renal Profile, Anthropometrics, Digestive System, Glucose Profile)  REASON FOR ASSESSMENT:   Malnutrition Screening Tool    ASSESSMENT:   Pt admitted with difficulty breathing secondary to reactive airyway disease with pulmonary fibrosis.  Past Medical History  Diagnosis Date  . Diabetes mellitus without complication ([place])   . Asthma   . HTN (hypertension)   . CAD (coronary artery disease)     Diet Order:  Diet heart healthy/carb modified Room service appropriate?: Yes; Fluid consistency:: Thin    Current Nutrition: Pt daughter reports pt has been eating very well since admission, much better than at home PTA.   Food/Nutrition-Related History: Pt daughter reports pt was living alone and she was checking on her after work over a year ago and pt was only eating 2 meals per day as she would forget or sleep through lunch meal most days. Pt daughter reports then she started working from home and 'making' her mother eat at meal times. Pt daughter reports pt would get frustrated with her but she tried very hard to keep her mother eating even though her appetite was poor. Pt daughter very interested in starting Glucerna shakes to help.   Scheduled Medications:  . aspirin  81 mg Oral Daily  . donepezil  10 mg Oral QHS  . enoxaparin (LOVENOX) injection  40 mg Subcutaneous QHS  . feeding supplement (GLUCERNA SHAKE)  237 mL Oral TID WC  . lisinopril  10 mg Oral Daily   And  . hydrochlorothiazide  12.5 mg Oral Daily  . insulin aspart   0-15 Units Subcutaneous TID WC  . insulin aspart  0-5 Units Subcutaneous QHS  . levofloxacin  250 mg Oral Daily  . methylPREDNISolone (SOLU-MEDROL) injection  60 mg Intravenous Q24H  . sodium chloride flush  3 mL Intravenous Q12H    Electrolyte/Renal Profile and Glucose Profile:   Recent Labs Lab 03/02/15 2116 03/03/15 0911  NA 127* 131*  K 3.7 3.6  CL 91* 98*  CO2 26 27  BUN 15 12  CREATININE 0.81 0.72  CALCIUM 8.1* 8.0*  GLUCOSE 183* 284*   Protein Profile:  Recent Labs Lab 03/02/15 2116  ALBUMIN 3.4*    Gastrointestinal Profile: Last BM:  03/03/2015   Nutrition-Focused Physical Exam Findings:  Unable to complete Nutrition-Focused physical exam at this time.    Weight Change: Pt daughter reports pt lost weight over a year ago, but since she has been staying home with her she has remained relatively stable that she knows of.   Height:   Ht Readings from Last 1 Encounters:  03/03/15 5' (1.524 m)    Weight:   Wt Readings from Last 1 Encounters:  03/03/15 151 lb 12.8 oz (68.856 kg)    Ideal Body Weight:   45.5kg  BMI:  Body mass index is 29.65 kg/(m^2).  Estimated Nutritional Needs:   Kcal:  BEE: 802kcals, TEE: (IF 1.1-1.3)(AF 1.3) 1146-1534kcals, using IBW of 45.5kg  Protein:  46-55g protein (1.0-1.2g/kg)  Fluid:  1138-1314mL of fluid (25-6mL/kg)  EDUCATION NEEDS:   No education  needs identified at this time   Major, RD, LDN Pager [phone] Weekend/On-Call Pager [phone]

## 2015-03-04 NOTE — Evaluation (Signed)
Physical Therapy Evaluation Patient Details Name: Ariana Miller MRN: [id number] DOB: July 26, 1923 Today's Date: 03/04/2015   History of Present Illness  Patient is an 82 y/o female that presents with increased dyspnea on exertion and progressive weakness. Daughter is at bedside, she is primary caregiver. 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   Clinical Impression  Patient has been living with her daughter as primary caregiver, per daughter patient has been steadily declining in independence with mobility for some time now. Patient reports to feeling quite sick today, and reports of onset of dizziness over the last few weeks. Patient reports increased dizziness with finger following with questionable nystagmus. Upon standing she reports increased dizziness and demonstrates poor use of SPC (which she typically uses at baseline) and minimal step lengths. Upon attempting ambulation with RW, she again complains of dizziness and asks to sit down after several feet due to fatigue, dizziness. Given the stark change in mobility from her baseline, patient would benefit from STR to increase her strength to reduce risk of increased time in sedentary positions and related complications.    Follow Up Recommendations SNF    Equipment Recommendations  Rolling walker with 5" wheels    Recommendations for Other Services       Precautions / Restrictions Precautions Precautions: Fall Restrictions Weight Bearing Restrictions: No      Mobility  Bed Mobility Overal bed mobility: Needs Assistance Bed Mobility: Supine to Sit     Supine to sit: Min guard;Min assist     General bed mobility comments: Patient is able to bring her legs to the edge of the bed, though she requires min A x1 to elevate her trunk secondary to weakness.   Transfers Overall transfer level: Needs assistance Equipment used: Rolling walker (2 wheeled) Transfers: Sit to/from Stand Sit to Stand: Min guard;Min  assist         General transfer comment: Patient is able to complete sit to stand transfers slowly, no loss of balance noted, though kyphotic posture noted throughout.   Ambulation/Gait Ambulation/Gait assistance: Min guard;Min assist Ambulation Distance (Feet): 5 Feet Assistive device: Rolling walker (2 wheeled) Gait Pattern/deviations: Step-to pattern;Decreased step length - right;Decreased step length - left;Shuffle;Trunk flexed;Narrow base of support   Gait velocity interpretation: <1.8 ft/sec, indicative of risk for recurrent falls General Gait Details: Patient reports feeling very dizzy and unable to ambulate further than a few feet due to weakness. No buckling noted, though minimal step distances noted with minimal floor clearance.   Stairs            Wheelchair Mobility    Modified Rankin (Stroke Patients Only)       Balance Overall balance assessment: Needs assistance Sitting-balance support: Bilateral upper extremity supported Sitting balance-Leahy Scale: Fair     Standing balance support: Bilateral upper extremity supported Standing balance-Leahy Scale: Poor Standing balance comment: Mild flexion in knees, and generally poor stride lengths as well as subjective reports of dizziness indicate poor balance.                              Pertinent Vitals/Pain Pain Assessment: No/denies pain (Patient states she feels sick and weak, not as much report of pain)    Home Living Family/patient expects to be discharged to:: Private residence Living Arrangements: Children (Daughter and adult grandchildren. ) Available Help at Discharge: Family Type of Home: House Home Access:  (1 step in from what PT could  gather)       Home Equipment: Kasandra Knudsen - single point      Prior Function Level of Independence: Independent with assistive device(s)         Comments: Patient has progressively declined with ambulation, she has been using a SPC for very limited  home mobility.      Hand Dominance        Extremity/Trunk Assessment   Upper Extremity Assessment: Generalized weakness           Lower Extremity Assessment: Generalized weakness (No focal deficits in strength, though decreased sensation reported in LLE from thigh, distally)      Cervical / Trunk Assessment: Kyphotic  Communication   Communication: Prefers language other than English  Cognition Arousal/Alertness: Lethargic;Awake/alert   Overall Cognitive Status: Within Functional Limits for tasks assessed (Per daughter, there may be some age related decline)                      General Comments General comments (skin integrity, edema, etc.): Unclear if any nystagmus with finger follow testing, though she reports increased dizziness.     Exercises        Assessment/Plan    PT Assessment Patient needs continued PT services  PT Diagnosis Difficulty walking;Generalized weakness   PT Problem List Decreased strength;Decreased knowledge of use of DME;Decreased activity tolerance;Decreased balance;Decreased mobility  PT Treatment Interventions DME instruction;Gait training;Stair training;Therapeutic activities;Therapeutic exercise;Balance training   PT Goals (Current goals can be found in the Care Plan section) Acute Rehab PT Goals Patient Stated Goal: To increase her strength PT Goal Formulation: With patient/family Time For Goal Achievement: 03/18/15 Potential to Achieve Goals: Good    Frequency Min 2X/week   Barriers to discharge        Co-evaluation               End of Session Equipment Utilized During Treatment: Gait belt Activity Tolerance: Patient limited by fatigue Patient left: in chair;with call bell/phone within reach;with chair alarm set;with family/visitor present Nurse Communication: Mobility status    Functional Assessment Tool Used: Clinical judgement  Functional Limitation: Mobility: Walking and moving around Mobility: Walking  and Moving Around Current Status [phone]): At least 40 percent but less than 60 percent impaired, limited or restricted Mobility: Walking and Moving Around Goal Status [phone]): At least 20 percent but less than 40 percent impaired, limited or restricted    Time: 1102-1129 PT Time Calculation (min) (ACUTE ONLY): 27 min   Charges:   PT Evaluation $PT Eval Moderate Complexity: 1 Procedure     PT G Codes:   PT G-Codes **NOT FOR INPATIENT CLASS** Functional Assessment Tool Used: Clinical judgement  Functional Limitation: Mobility: Walking and moving around Mobility: Walking and Moving Around Current Status [id number]): At least 40 percent but less than 60 percent impaired, limited or restricted Mobility: Walking and Moving Around Goal Status [phone]): At least 20 percent but less than 40 percent impaired, limited or restricted    Kerman Passey, PT, DPT    03/04/2015, 5:38 PM

## 2015-03-04 NOTE — Progress Notes (Signed)
[facility] [place] NAME: Ariana Miller    MR#:  [id number]  DATE OF BIRTH:  1923-06-11  SUBJECTIVE:  Daughter at bedside Some cough however breathing has improved States that she still gets short of breath walking and progressively weak Ambulated with physical therapy-patient very weak  REVIEW OF SYSTEMS:  CONSTITUTIONAL: No fever, positive fatigue or weakness.  EYES: No blurred or double vision.  EARS, NOSE, AND THROAT: No tinnitus or ear pain.  RESPIRATORY: Positive cough, shortness of breath, denies wheezing or hemoptysis.  CARDIOVASCULAR: No chest pain, orthopnea, edema.  GASTROINTESTINAL: No nausea, vomiting, diarrhea or abdominal pain.  GENITOURINARY: No dysuria, hematuria.  ENDOCRINE: No polyuria, nocturia,  HEMATOLOGY: No anemia, easy bruising or bleeding SKIN: No rash or lesion. MUSCULOSKELETAL: No joint pain or arthritis.   NEUROLOGIC: No tingling, numbness, weakness.  PSYCHIATRY: No anxiety or depression.   DRUG ALLERGIES:  No Known Allergies  VITALS:  Blood pressure 138/44, pulse 63, temperature 97.8 F (36.6 C), temperature source Oral, resp. rate 18, height 5' (1.524 m), weight 68.856 kg (151 lb 12.8 oz), SpO2 92 %.  PHYSICAL EXAMINATION:  VITAL SIGNS: Filed Vitals:   03/04/15 0424 03/04/15 0757  BP: 130/45 138/44  Pulse: 69 63  Temp: 97.5 F (36.4 C) 97.8 F (36.6 C)  Resp: 18    GENERAL:82 y.o.female currently in no acute distress.  HEAD: Normocephalic, atraumatic.  EYES: Pupils equal, round, reactive to light. Extraocular muscles intact. No scleral icterus.  MOUTH: Moist mucosal membrane. Dentition intact. No abscess noted.  EAR, NOSE, THROAT: Clear without exudates. No external lesions.  NECK: Supple. No thyromegaly. No nodules. No JVD.  PULMONARY: No more wheezing scant rhonchi No use of accessory muscles, Good respiratory effort. good air entry bilaterally CHEST: Nontender to palpation.   CARDIOVASCULAR: S1 and S2. Regular rate and rhythm. No murmurs, rubs, or gallops. No edema. Pedal pulses 2+ bilaterally.  GASTROINTESTINAL: Soft, nontender, nondistended. No masses. Positive bowel sounds. No hepatosplenomegaly.  MUSCULOSKELETAL: No swelling, clubbing, or edema. Range of motion full in all extremities.  NEUROLOGIC: Cranial nerves II through XII are intact. No gross focal neurological deficits. Sensation intact. Reflexes intact.  SKIN: No ulceration, lesions, rashes, or cyanosis. Skin warm and dry. Turgor intact.  PSYCHIATRIC: Mood, affect within normal limits. The patient is awake, alert and oriented x 3. Insight, judgment intact.      LABORATORY PANEL:   CBC  Recent Labs Lab 03/03/15 0911  WBC 7.5  HGB 11.3*  HCT 33.9*  PLT 99*   ------------------------------------------------------------------------------------------------------------------  Chemistries   Recent Labs Lab 03/02/15 2116 03/03/15 0911  NA 127* 131*  K 3.7 3.6  CL 91* 98*  CO2 26 27  GLUCOSE 183* 284*  BUN 15 12  CREATININE 0.81 0.72  CALCIUM 8.1* 8.0*  AST 30  --   ALT 19  --   ALKPHOS 105  --   BILITOT 0.9  --    ------------------------------------------------------------------------------------------------------------------  Cardiac Enzymes  Recent Labs Lab 03/03/15 1520  TROPONINI <0.03   ------------------------------------------------------------------------------------------------------------------  RADIOLOGY:  Dg Chest 2 View  03/02/2015  CLINICAL DATA:  Shortness of breath with cough. Recent diagnosis of bronchitis. EXAM: CHEST  2 VIEW COMPARISON:  01/05/2012 FINDINGS: Stable changes from cardiac surgery and valve replacement. No mediastinal or hilar masses or evidence of adenopathy. Cardiac silhouette is normal in size. Lungs show bilateral irregularly thickened interstitial markings that are without substantial change from the prior study. There is no convincing  pneumonia and no evidence of pulmonary edema. No pleural effusion or pneumothorax. 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 Bony thorax is demineralized. Right shoulder prosthesis is well-seated and aligned. IMPRESSION: 1. No acute cardiopulmonary disease. 2. 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 Electronically Signed   By: Lajean Manes M.D.   On: 03/02/2015 21:39    EKG:   Orders placed or performed during the hospital encounter of 03/02/15  . ED EKG  . ED EKG    ASSESSMENT AND PLAN:   82 year old female history of type 2 diabetes, essential hypertension presenting with shortness of breath and cough  1. Reactive airway disease not asthma: With pulmonary fibrosis, continue Xopenex, steroids, will add Levaquin for bronchitis coverage 2. Type 2 diabetes non-insulin-requiring: Poorly controlled Hold oral agents increase insulin sliding scale 3. Essential hypertension: Continue lisinopril 4. Venous thromboembolism prophylactic Lovenox  Disposition placement   All the records are reviewed and case discussed with Care Management/Social Workerr. Management plans discussed with the patient, family and they are in agreement.  CODE STATUS: Full  TOTAL TIME TAKING CARE OF THIS PATIENT:33 minutes.   POSSIBLE D/C IN 1-2 DAYS, DEPENDING ON CLINICAL CONDITION.   Hower,  Karenann Cai.D on 03/04/2015 at 12:41 PM  Between 7am to 6pm - Pager - [phone]  After 6pm: House Pager: - [phone]  [facility]  Office  [phone]  CC: Primary care physician; Rusty Aus., MD

## 2015-03-05 DIAGNOSIS — J988 Other specified respiratory disorders: Secondary | ICD-10-CM | POA: Diagnosis not present

## 2015-03-05 LAB — GLUCOSE, CAPILLARY
GLUCOSE-CAPILLARY: 150 mg/dL — AB (ref 65–99)
Glucose-Capillary: 184 mg/dL — ABNORMAL HIGH (ref 65–99)

## 2015-03-05 MED ORDER — GUAIFENESIN-CODEINE 100-10 MG/5ML PO SOLN: 10.0000 mL | ORAL | Status: DC | PRN

## 2015-03-05 MED ORDER — GLUCERNA SHAKE PO LIQD: 237.0000 mL | Freq: Three times a day (TID) | ORAL | Status: DC

## 2015-03-05 MED ORDER — LEVOFLOXACIN 250 MG PO TABS: 250.0000 mg | ORAL_TABLET | Freq: Every day | ORAL | Status: DC

## 2015-03-05 MED ORDER — PREDNISONE 10 MG (21) PO TBPK: ORAL_TABLET | ORAL | Status: DC

## 2015-03-05 MED ORDER — LEVALBUTEROL TARTRATE 45 MCG/ACT IN AERO: 1.0000 | INHALATION_SPRAY | Freq: Four times a day (QID) | RESPIRATORY_TRACT | Status: DC | PRN

## 2015-03-05 MED ORDER — LEVOFLOXACIN 250 MG PO TABS: 500.0000 mg | ORAL_TABLET | Freq: Every day | ORAL | Status: DC

## 2015-03-05 NOTE — NC FL2 (Signed)
[place] LEVEL OF CARE SCREENING TOOL     IDENTIFICATION  Patient Name: Ariana Miller Birthdate: 1923-12-08 Sex: female Admission Date (Current Location): 03/02/2015  [place] and [place] Number:  Engineering geologist and Address:  [facility], [address], [place], [place] [address]      Provider Number: [id number]  Attending Physician Name and Address:  Lytle Butte, MD  Relative Name and Phone Number:       Current Level of Care: Hospital Recommended Level of Care: [place] Prior Approval Number:    Date Approved/Denied:   PASRR Number: [id number]  Discharge Plan: Home    Current Diagnoses: Patient Active Problem List   Diagnosis Date Noted  . Reactive airway disease that is not asthma 03/02/2015  . Hyponatremia 03/02/2015  . Type 2 diabetes mellitus ([place]) 03/02/2015  . Accelerated hypertension 03/02/2015  . CAD (coronary artery disease) 03/02/2015    Orientation RESPIRATION BLADDER Height & Weight     Self, Time, Situation, Place  Normal Continent Weight: 151 lb 12.8 oz (68.856 kg) Height:  5' (152.4 cm)  BEHAVIORAL SYMPTOMS/MOOD NEUROLOGICAL BOWEL NUTRITION STATUS      Continent Diet (Heart Healthy/Carb Modified, Thin Liquids)  AMBULATORY STATUS COMMUNICATION OF NEEDS Skin   Limited Assist Verbally Normal                       Personal Care Assistance Level of Assistance  Bathing, Feeding, Dressing Bathing Assistance: Limited assistance Feeding assistance: Independent Dressing Assistance: Limited assistance     Functional Limitations Info  Sight, Hearing, Speech Sight Info: Adequate Hearing Info: Adequate Speech Info: Adequate (Spanish Speaking ONLY)    SPECIAL CARE FACTORS FREQUENCY  PT (By licensed PT)                    Contractures      Additional Factors Info  Code Status, Allergies, Insulin Sliding Scale Code Status Info: Full Code Allergies Info: No known  allergies   Insulin Sliding Scale Info: 4x/day       Current Medications (03/05/2015):  This is the current hospital active medication list Current Facility-Administered Medications  Medication Dose Route Frequency Provider Last Rate Last Dose  . acetaminophen (TYLENOL) tablet 650 mg  650 mg Oral Q6H PRN Lance Coon, MD       Or  . acetaminophen (TYLENOL) suppository 650 mg  650 mg Rectal Q6H PRN Lance Coon, MD      . aspirin chewable tablet 81 mg  81 mg Oral Daily Lance Coon, MD   81 mg at 03/05/15 0827  . donepezil (ARICEPT) tablet 10 mg  10 mg Oral QHS Lance Coon, MD   10 mg at 03/04/15 2125  . enoxaparin (LOVENOX) injection 40 mg  40 mg Subcutaneous QHS Lance Coon, MD   40 mg at 03/04/15 2125  . feeding supplement (GLUCERNA SHAKE) (GLUCERNA SHAKE) liquid 237 mL  237 mL Oral TID WC Lytle Butte, MD   237 mL at 03/05/15 1146  . guaiFENesin-codeine 100-10 MG/5ML solution 10 mL  10 mL Oral Q4H PRN Lytle Butte, MD   10 mL at 03/05/15 0530  . hydrALAZINE (APRESOLINE) injection 10 mg  10 mg Intravenous Q4H PRN Lytle Butte, MD   10 mg at 03/03/15 2323  . lisinopril (PRINIVIL,ZESTRIL) tablet 10 mg  10 mg Oral Daily Lance Coon, MD   10 mg at 03/05/15 0827   And  .  hydrochlorothiazide (MICROZIDE) capsule 12.5 mg  12.5 mg Oral Daily Lance Coon, MD   12.5 mg at 03/05/15 0827  . insulin aspart (novoLOG) injection 0-15 Units  0-15 Units Subcutaneous TID WC Lytle Butte, MD   3 Units at 03/05/15 1146  . insulin aspart (novoLOG) injection 0-5 Units  0-5 Units Subcutaneous QHS Lytle Butte, MD   0 Units at 03/04/15 2216  . labetalol (NORMODYNE,TRANDATE) injection 10 mg  10 mg Intravenous Q2H PRN Lance Coon, MD   10 mg at 03/03/15 [id number]  . levalbuterol (XOPENEX) nebulizer solution 0.63 mg  0.63 mg Nebulization Q4H PRN Lytle Butte, MD   0.63 mg at 03/05/15 0913  . levofloxacin (LEVAQUIN) tablet 250 mg  250 mg Oral Daily Lytle Butte, MD   250 mg at 03/05/15 0827  . methylPREDNISolone  sodium succinate (SOLU-MEDROL) 125 mg/2 mL injection 60 mg  60 mg Intravenous Q24H Lytle Butte, MD   60 mg at 03/05/15 0826  . ondansetron (ZOFRAN) tablet 4 mg  4 mg Oral Q6H PRN Lance Coon, MD       Or  . ondansetron [facility]) injection 4 mg  4 mg Intravenous Q6H PRN Lance Coon, MD   4 mg at 03/05/15 0837  . sodium chloride flush (NS) 0.9 % injection 3 mL  3 mL Intravenous Q12H Lance Coon, MD   3 mL at 03/05/15 [id number]     Discharge Medications: Please see discharge summary for a list of discharge medications.  Relevant Imaging Results:  Relevant Lab Results:   Additional Information SSN:  [id number]  Darden Dates, LCSW

## 2015-03-05 NOTE — Clinical Social Work Note (Signed)
Clinical Social Work Assessment  Patient Details  Name: Ariana Miller MRN: [id number] Date of Birth: 03/19/1923  Date of referral:  03/05/15               Reason for consult:  Facility Placement                Permission sought to share information with:  Family Supports Permission granted to share information::  Yes, Verbal Permission Granted  Name::     Ermelinda Das, daughter   Housing/Transportation Living arrangements for the past 2 months:  [place] of Information:  Patient, Adult Children Patient Interpreter Needed:  Spanish Criminal Activity/Legal Involvement Pertinent to Current Situation/Hospitalization:  No - Comment as needed Significant Relationships:  Adult Children Lives with:  Adult Children Do you feel safe going back to the place where you live?  Yes Need for family participation in patient care:  Yes (Comment)  Care giving concerns:  No care giving concerns identified.    Social Worker assessment / plan:  CSW met with pt and daughter, along with Spanish Interpreter to address consult. Pt's daughter is Bilingual ([place] and [place]) and pt is Spanish speaking only. CSW introduced herself and explained role of social work. CSW also explained the process of discharging to SNF for STR using her Medicaid benefit. Pt was admitted under Medicare OBS, therefor Medicare will not cover room and board at SNF. Pt's daughter was agreeable to discharge plan and pt reported that she was as well. CSW updated MD as pt has been discharged and was initiatively returning home. CSW intiaited SNF search. However pt changed her mind after assessment per daughter as pt was very upset. CSW updated RN and RNCM of change of discharge plan. CSW also spoke with MD. Pt will discharge home with home health services. CSW is signing off as no further needs identified.   Employment status:  Retired Forensic scientist:  Information systems manager, Medicaid In [place] PT Recommendations:  [place] / Referral to community resources:  [place]  Patient/Family's Response to care:  Pt's daughter was Patent attorney of CSW support.   Patient/Family's Understanding of and Emotional Response to Diagnosis, Current Treatment, and Prognosis:  Pt's daughter feels that pt would be best cared for at home due to language barrier.   Emotional Assessment Appearance:  Appears stated age Attitude/Demeanor/Rapport:  Other (Appropriate) Affect (typically observed):  Pleasant Orientation:  Oriented to Self, Oriented to Place, Oriented to  Time, Oriented to Situation Alcohol / Substance use:  Never Used Psych involvement (Current and /or in the community):  No (Comment)  Discharge Needs  Concerns to be addressed:  Patient refuses services Readmission within the last 30 days:  No Current discharge risk:  None Barriers to Discharge:  No Barriers Identified   Darden Dates, LCSW 03/05/2015, 1:40 PM

## 2015-03-05 NOTE — Care Management (Signed)
Spoke with Ms. Wetherington's daughter at the bedside. Discussed Home Health services/agencies. [facility] Will update Floydene Flock, Advanced home Care representative. Daughter will transport. Shelbie Ammons RN MSN CCM Care Management [phone]

## 2015-03-05 NOTE — Discharge Summary (Signed)
[facility] [place] NAME: Ariana Miller    MR#:  [id number]  DATE OF BIRTH:  [birth year]  DATE OF ADMISSION:  03/02/2015 ADMITTING PHYSICIAN: Lance Coon, MD  DATE OF DISCHARGE: No discharge date for patient encounter.  PRIMARY CARE PHYSICIAN: Rusty Aus, MD    ADMISSION DIAGNOSIS:  Hyponatremia [E87.1] Bronchitis [J40] Elevated troponin I level [R79.89] Asthma, unspecified asthma severity, with acute exacerbation [J45.901]  DISCHARGE DIAGNOSIS:  Principal Problem:   Reactive airway disease that is not asthma Active Problems:   Hyponatremia   Type 2 diabetes mellitus (HCC)   Accelerated hypertension   CAD (coronary artery disease)   SECONDARY DIAGNOSIS:   Past Medical History  Diagnosis Date  . Diabetes mellitus without complication ([place])   . Asthma   . HTN (hypertension)   . CAD (coronary artery disease)     HOSPITAL COURSE:  Ariana Miller  is a 82 y.o. female admitted 03/02/2015 with chief complaint cough and shortness of breath. Please see H&P performed by Dr. Jannifer Franklin for further information. With the above complaints she was started on breathing treatments steroids and antibiotics. Her respiratory status improved to the point where she is able to come off of any oxygen therapy. The family is concerned about generalized mobility issues. We'll arrange for home physical therapy.  DISCHARGE CONDITIONS:   Respiratory status improved  CONSULTS OBTAINED:     DRUG ALLERGIES:  No Known Allergies  DISCHARGE MEDICATIONS:   Current Discharge Medication List    START taking these medications   Details  feeding supplement, GLUCERNA SHAKE, (GLUCERNA SHAKE) LIQD Take 237 mLs by mouth 3 (three) times daily with meals. Qty: 21 Can, Refills: 0    guaiFENesin-codeine 100-10 MG/5ML syrup Take 10 mLs by mouth every 4 (four) hours as needed for cough. Qty: 120 mL, Refills: 0    levofloxacin (LEVAQUIN) 250 MG tablet Take  1 tablet (250 mg total) by mouth daily. Qty: 4 tablet, Refills: 0    predniSONE (STERAPRED UNI-PAK 21 TAB) 10 MG (21) TBPK tablet 40mg  oral 1 day, then 20mg  oral for 2 days, then 10mg  oral 2 days, then stop Qty: 10 tablet, Refills: 0      CONTINUE these medications which have NOT CHANGED   Details  aspirin 81 MG chewable tablet Chew 81 mg by mouth daily.    donepezil (ARICEPT) 10 MG tablet Take 10 mg by mouth at bedtime.    glimepiride (AMARYL) 1 MG tablet Take 0.5 mg by mouth daily.    lisinopril-hydrochlorothiazide (PRINZIDE,ZESTORETIC) 10-12.5 MG tablet Take 1 tablet by mouth daily.         DISCHARGE INSTRUCTIONS:    DIET:  Diabetic diet  DISCHARGE CONDITION:  Stable  ACTIVITY:  Activity as tolerated  OXYGEN:  Home Oxygen: No.   Oxygen Delivery: room air  DISCHARGE LOCATION:  home   If you experience worsening of your admission symptoms, develop shortness of breath, life threatening emergency, suicidal or homicidal thoughts you must seek medical attention immediately by calling 911 or calling your MD immediately  if symptoms less severe.  You Must read complete instructions/literature along with all the possible adverse reactions/side effects for all the Medicines you take and that have been prescribed to you. Take any new Medicines after you have completely understood and accpet all the possible adverse reactions/side effects.   Please note  You were cared for by a hospitalist during your hospital stay. If you have any questions about your  discharge medications or the care you received while you were in the hospital after you are discharged, you can call the unit and asked to speak with the hospitalist on call if the hospitalist that took care of you is not available. Once you are discharged, your primary care physician will handle any further medical issues. Please note that NO REFILLS for any discharge medications will be authorized once you are discharged, as it  is imperative that you return to your primary care physician (or establish a relationship with a primary care physician if you do not have one) for your aftercare needs so that they can reassess your need for medications and monitor your lab values.    On the day of Discharge:   VITAL SIGNS:  Blood pressure 170/58, pulse 67, temperature 97.4 F (36.3 C), temperature source Oral, resp. rate 18, height 5' (1.524 m), weight 151 lb 12.8 oz (68.856 kg), SpO2 97 %.  I/O:   Intake/Output Summary (Last 24 hours) at 03/05/15 1017 Last data filed at 03/05/15 0900  Gross per 24 hour  Intake    240 ml  Output      0 ml  Net    240 ml    PHYSICAL EXAMINATION:  GENERAL:  82 y.o.-year-old patient lying in the bed with no acute distress.  EYES: Pupils equal, round, reactive to light and accommodation. No scleral icterus. Extraocular muscles intact.  HEENT: Head atraumatic, normocephalic. Oropharynx and nasopharynx clear.  NECK:  Supple, no jugular venous distention. No thyroid enlargement, no tenderness.  LUNGS: Normal breath sounds bilaterally, no wheezing, rales,rhonchi or crepitation. No use of accessory muscles of respiration.  CARDIOVASCULAR: S1, S2 normal. No murmurs, rubs, or gallops.  ABDOMEN: Soft, non-tender, non-distended. Bowel sounds present. No organomegaly or mass.  EXTREMITIES: No pedal edema, cyanosis, or clubbing.  NEUROLOGIC: Cranial nerves II through XII are intact. Muscle strength 5/5 in all extremities. Sensation intact. Gait not checked.  PSYCHIATRIC: The patient is alert and oriented x 3.  SKIN: No obvious rash, lesion, or ulcer.   DATA REVIEW:   CBC  Recent Labs Lab 03/03/15 0911  WBC 7.5  HGB 11.3*  HCT 33.9*  PLT 99*    Chemistries   Recent Labs Lab 03/02/15 2116 03/03/15 0911  NA 127* 131*  K 3.7 3.6  CL 91* 98*  CO2 26 27  GLUCOSE 183* 284*  BUN 15 12  CREATININE 0.81 0.72  CALCIUM 8.1* 8.0*  AST 30  --   ALT 19  --   ALKPHOS 105  --    BILITOT 0.9  --     Cardiac Enzymes  Recent Labs Lab 03/03/15 1520  TROPONINI <0.03    Microbiology Results  No results found for this or any previous visit.  RADIOLOGY:  No results found.   Management plans discussed with the patient, family and they are in agreement.  CODE STATUS:     Code Status Orders        Start     Ordered   03/03/15 0128  Full code   Continuous     03/03/15 0127    Code Status History    Date Active Date Inactive Code Status Order ID Comments User Context   This patient has a current code status but no historical code status.      TOTAL TIME TAKING CARE OF THIS PATIENT: 28 minutes.    Hower,  Karenann Cai.D on 03/05/2015 at 10:17 AM  Between 7am to 6pm -  Pager - [phone]  After 6pm go to [email or web address] - password [email or web address] [facility]  Office  [phone]  CC: Primary care physician; Rusty Aus, MD

## 2015-03-05 NOTE — Progress Notes (Signed)
Pt being discharged today, IV removed, pt belongings given back to patient. Discharge instructions reviewed with pt's daughter/caregiver. Prescriptions given to the daughter. Pt rolled out in wheelchair by staff.

## 2015-03-12 DIAGNOSIS — J8410 Pulmonary fibrosis, unspecified: Secondary | ICD-10-CM | POA: Insufficient documentation

## 2015-09-20 ENCOUNTER — Encounter: Payer: Self-pay | Admitting: Emergency Medicine

## 2015-09-20 ENCOUNTER — Emergency Department: Payer: Medicare Other

## 2015-09-20 ENCOUNTER — Emergency Department
Admission: EM | Admit: 2015-09-20 | Discharge: 2015-09-21 | Disposition: A | Discharge status: Home / Self Care | Payer: Medicare Other | Attending: Emergency Medicine | Admitting: Emergency Medicine

## 2015-09-20 DIAGNOSIS — I2510 Atherosclerotic heart disease of native coronary artery without angina pectoris: Secondary | ICD-10-CM | POA: Insufficient documentation

## 2015-09-20 DIAGNOSIS — R05 Cough: Secondary | ICD-10-CM

## 2015-09-20 DIAGNOSIS — Z79899 Other long term (current) drug therapy: Secondary | ICD-10-CM | POA: Diagnosis not present

## 2015-09-20 DIAGNOSIS — E119 Type 2 diabetes mellitus without complications: Secondary | ICD-10-CM | POA: Insufficient documentation

## 2015-09-20 DIAGNOSIS — I10 Essential (primary) hypertension: Secondary | ICD-10-CM | POA: Insufficient documentation

## 2015-09-20 DIAGNOSIS — J40 Bronchitis, not specified as acute or chronic: Secondary | ICD-10-CM | POA: Diagnosis not present

## 2015-09-20 DIAGNOSIS — R0981 Nasal congestion: Secondary | ICD-10-CM | POA: Diagnosis present

## 2015-09-20 DIAGNOSIS — Z7982 Long term (current) use of aspirin: Secondary | ICD-10-CM | POA: Diagnosis not present

## 2015-09-20 DIAGNOSIS — R059 Cough, unspecified: Secondary | ICD-10-CM

## 2015-09-20 LAB — COMPREHENSIVE METABOLIC PANEL
ALBUMIN: 3.6 g/dL (ref 3.5–5.0)
ALK PHOS: 83 U/L (ref 38–126)
ALT: 15 U/L (ref 14–54)
ANION GAP: 9 (ref 5–15)
AST: 37 U/L (ref 15–41)
BILIRUBIN TOTAL: 0.7 mg/dL (ref 0.3–1.2)
BUN: 29 mg/dL — ABNORMAL HIGH (ref 6–20)
CALCIUM: 8.8 mg/dL — AB (ref 8.9–10.3)
CO2: 25 mmol/L (ref 22–32)
Chloride: 99 mmol/L — ABNORMAL LOW (ref 101–111)
Creatinine, Ser: 1.16 mg/dL — ABNORMAL HIGH (ref 0.44–1.00)
GFR calc Af Amer: 47 mL/min — ABNORMAL LOW (ref >60)
GFR, EST NON AFRICAN AMERICAN: 40 mL/min — AB (ref >60)
GLUCOSE: 184 mg/dL — AB (ref 65–99)
POTASSIUM: 4.3 mmol/L (ref 3.5–5.1)
Sodium: 133 mmol/L — ABNORMAL LOW (ref 135–145)
TOTAL PROTEIN: 7.7 g/dL (ref 6.5–8.1)

## 2015-09-20 LAB — CBC WITH DIFFERENTIAL/PLATELET
Basophils Absolute: 0.1 10*3/uL (ref 0–0.1)
Basophils Relative: 1 %
Eosinophils Absolute: 0.2 10*3/uL (ref 0–0.7)
Eosinophils Relative: 2 %
HEMATOCRIT: 36.6 % (ref 35.0–47.0)
HEMOGLOBIN: 12.3 g/dL (ref 12.0–16.0)
LYMPHS ABS: 1.6 10*3/uL (ref 1.0–3.6)
LYMPHS PCT: 14 %
MCH: 30.1 pg (ref 26.0–34.0)
MCHC: 33.6 g/dL (ref 32.0–36.0)
MCV: 89.6 fL (ref 80.0–100.0)
MONO ABS: 1.3 10*3/uL — AB (ref 0.2–0.9)
MONOS PCT: 12 %
NEUTROS ABS: 7.7 10*3/uL — AB (ref 1.4–6.5)
NEUTROS PCT: 71 %
Platelets: 101 10*3/uL — ABNORMAL LOW (ref 150–440)
RBC: 4.08 MIL/uL (ref 3.80–5.20)
RDW: 16.6 % — AB (ref 11.5–14.5)
WBC: 10.8 10*3/uL (ref 3.6–11.0)

## 2015-09-20 LAB — BRAIN NATRIURETIC PEPTIDE: B NATRIURETIC PEPTIDE 5: 327 pg/mL — AB (ref 0.0–100.0)

## 2015-09-20 NOTE — ED Triage Notes (Addendum)
Pt presents to ED with frequent non-productive cough and congestion for two days. Hx of bronchitis with admission to this hosptial. Denies fever. Cough present in triage. No increased work of breathing noted.

## 2015-09-20 NOTE — ED Notes (Signed)
Kolten Ryback RN helped the Pt to the bedside commode. Pt returned to her bed without difficulty.  

## 2015-09-21 MED ORDER — FUROSEMIDE 20 MG PO TABS: 20.0000 mg | ORAL_TABLET | Freq: Every day | ORAL | 0 refills | Status: DC

## 2015-09-21 MED ORDER — ALBUTEROL SULFATE HFA 108 (90 BASE) MCG/ACT IN AERS: 2.0000 | INHALATION_SPRAY | Freq: Four times a day (QID) | RESPIRATORY_TRACT | 2 refills | Status: DC | PRN

## 2015-09-21 NOTE — ED Provider Notes (Signed)
Time Seen: Approximately 1059  I have reviewed the triage notes  Chief Complaint: Cough   History of Present Illness: Ariana Miller is a 82 y.o. female who is had a frequent dry nonproductive cough with some nasal congestion over the last 2 days. Patient's had a previous history of bronchitis. She's also had a previous cardiac history. No obvious fever at home. History and review of systems along with discharge instructions were done through the patient's daughter who is translating for [place] here in emergency department.   Past Medical History:  Diagnosis Date  . Asthma   . CAD (coronary artery disease)   . Diabetes mellitus without complication ([place])   . HTN (hypertension)     Patient Active Problem List   Diagnosis Date Noted  . Reactive airway disease that is not asthma 03/02/2015  . Hyponatremia 03/02/2015  . Type 2 diabetes mellitus ([place]) 03/02/2015  . Accelerated hypertension 03/02/2015  . CAD (coronary artery disease) 03/02/2015    Past Surgical History:  Procedure Laterality Date  . AORTIC VALVE REPLACEMENT (AVR)/CORONARY ARTERY BYPASS GRAFTING (CABG)    . CAROTID ENDARTERECTOMY    . CHOLECYSTECTOMY      Past Surgical History:  Procedure Laterality Date  . AORTIC VALVE REPLACEMENT (AVR)/CORONARY ARTERY BYPASS GRAFTING (CABG)    . CAROTID ENDARTERECTOMY    . CHOLECYSTECTOMY      Current Outpatient Rx  . Order #: [id number] Class: Print  . Order #: [id number] Class: Historical Med  . Order #: [id number] Class: Historical Med  . Order #: [id number] Class: Print  . Order #: [id number] Class: Print  . Order #: [id number] Class: Historical Med  . Order #: [id number] Class: Print  . Order #: [id number] Class: Print  . Order #: [id number] Class: Print  . Order #: [id number] Class: Historical Med  . Order #: [id number] Class: Print    Allergies:  Review of patient's allergies indicates no known allergies.  Family History: Family History  Problem Relation Age of Onset  .  Hepatitis Mother   . Liver disease Father   . Asthma    . Osteoporosis    . Hypertension      Social History: Social History  Substance Use Topics  . Smoking status: Never Smoker  . Smokeless tobacco: Never Used  . Alcohol use No     Review of Systems:   10 point review of systems was performed and was otherwise negative:  Constitutional: No fever Eyes: No visual disturbances ENT: No sore throat, ear pain Cardiac: No chest pain Respiratory: No shortness of breath, wheezing, or stridor Abdomen: No abdominal pain, no vomiting, No diarrhea Endocrine: No weight loss, No night sweats Extremities: No peripheral edema, cyanosis Skin: No rashes, easy bruising Neurologic: No focal weakness, trouble with speech or swollowing Urologic: No dysuria, Hematuria, or urinary frequency   Physical Exam:  ED Triage Vitals [09/20/15 2211]  Enc Vitals Group     BP (!) 138/53     Pulse Rate 88     Resp 20     Temp 98.8 F (37.1 C)     Temp Source Oral     SpO2 96 %     Weight 152 lb 6.4 oz (69.1 kg)     Height 5' (1.524 m)     Head Circumference      Peak Flow      Pain Score 0     Pain Loc      Pain Edu?      Excl. in [place]?     General: Awake ,  Alert , and Oriented times 3; GCS 15 Head: Normal cephalic , atraumatic Eyes: Pupils equal , round, reactive to light Nose/Throat: No nasal drainage, patent upper airway without erythema or exudate.  Neck: Supple, Full range of motion, No anterior adenopathy or palpable thyroid masses Lungs: Clear to ascultation without wheezes , rhonchi, or rales Heart: Regular rate, regular rhythm without murmurs , gallops , or rubs Abdomen: Soft, non tender without rebound, guarding , or rigidity; bowel sounds positive and symmetric in all 4 quadrants. No organomegaly .        Extremities: 2 plus symmetric pulses. No edema, clubbing or cyanosis Neurologic: normal ambulation, Motor symmetric without deficits, sensory intact Skin: warm, dry, no  rashes   Labs:   All laboratory work was reviewed including any pertinent negatives or positives listed below:  Labs Reviewed  CBC WITH DIFFERENTIAL/PLATELET - Abnormal; Notable for the following:       Result Value   RDW 16.6 (*)    Platelets 101 (*)    Neutro Abs 7.7 (*)    Monocytes Absolute 1.3 (*)    All other components within normal limits  COMPREHENSIVE METABOLIC PANEL - Abnormal; Notable for the following:    Sodium 133 (*)    Chloride 99 (*)    Glucose, Bld 184 (*)    BUN 29 (*)    Creatinine, Ser 1.16 (*)    Calcium 8.8 (*)    GFR calc non Af Amer 40 (*)    GFR calc Af Amer 47 (*)    All other components within normal limits  BRAIN NATRIURETIC PEPTIDE - Abnormal; Notable for the following:    B Natriuretic Peptide 327.0 (*)    All other components within normal limits  BNP is slightly elevated  EKG:  ED ECG REPORT I, Daymon Larsen, the attending physician, personally viewed and interpreted this ECG.  Date: 09/21/2015 EKG Time: *2323 Rate: 84 Rhythm: normal sinus rhythm QRS Axis: normal Intervals: Right bundle-branch block ST/T Wave abnormalities: normal Conduction Disturbances: none Narrative Interpretation: unremarkable    Radiology:  "Dg Chest 2 View  Result Date: 09/20/2015 CLINICAL DATA:  Acute onset of nonproductive cough and congestion. Initial encounter. EXAM: CHEST  2 VIEW COMPARISON:  Chest radiograph performed 03/02/2015 FINDINGS: The lungs are hyperexpanded, with flattening of the hemidiaphragms, compatible with COPD. Chronically increased interstitial markings are noted. There is no evidence of pleural effusion or pneumothorax. The heart is borderline enlarged. The patient is status post median sternotomy. An aortic valve replacement is noted. No acute osseous abnormalities are seen. A right shoulder arthroplasty is noted. IMPRESSION: Findings of COPD. No acute cardiopulmonary process seen. Borderline cardiomegaly. Electronically Signed   By:  Garald Balding M.D.   On: 09/20/2015 23:19  "   I personally reviewed the radiologic studies     ED Course:  Patient's stay here was uneventful and it appears that her BNP is elevated though she doesn't have any findings other than some COPD on her chest x-ray. Because of the persistent nature of her cough I felt that she would benefit with oral antibiotics and has been appears to be some findings of urinary tract infection. Patient was also given a prescription for an inhaler which he's used before in the past and 3 days of Lasix. She was on lisinopril/hydrochlorothiazide but is no longer taking that medication and I prescribed her Lasix in case this is a mild pulmonary edema based on the BNP Clinical Course     Assessment:  Acute bronchitis   Final Clinical Impression:   Final diagnoses:  Cough  Bronchitis     Plan Outpatient " Discharge Medication List as of 09/21/2015 12:37 AM    START taking these medications   Details  albuterol (PROVENTIL HFA;VENTOLIN HFA) 108 (90 Base) MCG/ACT inhaler Inhale 2 puffs into the lungs every 6 (six) hours as needed for wheezing or shortness of breath., Starting Mon 09/21/2015, Print    furosemide (LASIX) 20 MG tablet Take 1 tablet (20 mg total) by mouth daily., Starting Mon 09/21/2015, Until Fri 09/23/2016, Print      " Patient was advised to return immediately if condition worsens. Patient was advised to follow up with their primary care physician or other specialized physicians involved in their outpatient care. The patient and/or family member/power of attorney had laboratory results reviewed at the bedside. All questions and concerns were addressed and appropriate discharge instructions were distributed by the nursing staff.             Daymon Larsen, MD 09/21/15 [id number]

## 2015-09-21 NOTE — Discharge Instructions (Signed)
Please return immediately if condition worsens. Please contact her primary physician or the physician you were given for referral. If you have any specialist physicians involved in her treatment and plan please also contact them. Thank you for using [facility]. ° °

## 2015-10-06 ENCOUNTER — Ambulatory Visit: Payer: Self-pay | Admitting: Urology

## 2015-10-15 ENCOUNTER — Ambulatory Visit: Payer: Self-pay | Admitting: Urology

## 2015-10-18 ENCOUNTER — Encounter: Payer: Self-pay | Admitting: Emergency Medicine

## 2015-10-18 ENCOUNTER — Observation Stay
Admission: EM | Admit: 2015-10-18 | Discharge: 2015-10-20 | Disposition: A | Discharge status: Home / Self Care | Payer: Medicare Other | Attending: Internal Medicine | Admitting: Internal Medicine

## 2015-10-18 DIAGNOSIS — Z87891 Personal history of nicotine dependence: Secondary | ICD-10-CM | POA: Insufficient documentation

## 2015-10-18 DIAGNOSIS — Z7982 Long term (current) use of aspirin: Secondary | ICD-10-CM | POA: Diagnosis not present

## 2015-10-18 DIAGNOSIS — I080 Rheumatic disorders of both mitral and aortic valves: Secondary | ICD-10-CM | POA: Diagnosis not present

## 2015-10-18 DIAGNOSIS — Z66 Do not resuscitate: Secondary | ICD-10-CM | POA: Insufficient documentation

## 2015-10-18 DIAGNOSIS — R319 Hematuria, unspecified: Secondary | ICD-10-CM | POA: Diagnosis not present

## 2015-10-18 DIAGNOSIS — I4510 Unspecified right bundle-branch block: Secondary | ICD-10-CM | POA: Insufficient documentation

## 2015-10-18 DIAGNOSIS — I2510 Atherosclerotic heart disease of native coronary artery without angina pectoris: Secondary | ICD-10-CM | POA: Insufficient documentation

## 2015-10-18 DIAGNOSIS — Z951 Presence of aortocoronary bypass graft: Secondary | ICD-10-CM | POA: Insufficient documentation

## 2015-10-18 DIAGNOSIS — J45909 Unspecified asthma, uncomplicated: Secondary | ICD-10-CM | POA: Diagnosis not present

## 2015-10-18 DIAGNOSIS — R2681 Unsteadiness on feet: Secondary | ICD-10-CM

## 2015-10-18 DIAGNOSIS — E119 Type 2 diabetes mellitus without complications: Secondary | ICD-10-CM | POA: Diagnosis not present

## 2015-10-18 DIAGNOSIS — I619 Nontraumatic intracerebral hemorrhage, unspecified: Secondary | ICD-10-CM

## 2015-10-18 DIAGNOSIS — R55 Syncope and collapse: Secondary | ICD-10-CM | POA: Diagnosis not present

## 2015-10-18 DIAGNOSIS — I10 Essential (primary) hypertension: Secondary | ICD-10-CM | POA: Diagnosis not present

## 2015-10-18 DIAGNOSIS — Z9049 Acquired absence of other specified parts of digestive tract: Secondary | ICD-10-CM | POA: Diagnosis not present

## 2015-10-18 DIAGNOSIS — R531 Weakness: Secondary | ICD-10-CM

## 2015-10-18 DIAGNOSIS — G319 Degenerative disease of nervous system, unspecified: Secondary | ICD-10-CM | POA: Diagnosis not present

## 2015-10-18 DIAGNOSIS — D759 Disease of blood and blood-forming organs, unspecified: Secondary | ICD-10-CM | POA: Diagnosis not present

## 2015-10-18 DIAGNOSIS — E871 Hypo-osmolality and hyponatremia: Secondary | ICD-10-CM | POA: Diagnosis not present

## 2015-10-18 DIAGNOSIS — D649 Anemia, unspecified: Secondary | ICD-10-CM | POA: Insufficient documentation

## 2015-10-18 DIAGNOSIS — K5900 Constipation, unspecified: Secondary | ICD-10-CM | POA: Diagnosis not present

## 2015-10-18 DIAGNOSIS — M8580 Other specified disorders of bone density and structure, unspecified site: Secondary | ICD-10-CM | POA: Insufficient documentation

## 2015-10-18 DIAGNOSIS — F0390 Unspecified dementia without behavioral disturbance: Secondary | ICD-10-CM | POA: Insufficient documentation

## 2015-10-18 DIAGNOSIS — I6523 Occlusion and stenosis of bilateral carotid arteries: Secondary | ICD-10-CM | POA: Diagnosis not present

## 2015-10-18 DIAGNOSIS — R262 Difficulty in walking, not elsewhere classified: Secondary | ICD-10-CM

## 2015-10-18 DIAGNOSIS — E876 Hypokalemia: Secondary | ICD-10-CM | POA: Diagnosis not present

## 2015-10-18 DIAGNOSIS — M6281 Muscle weakness (generalized): Secondary | ICD-10-CM

## 2015-10-18 DIAGNOSIS — L8990 Pressure ulcer of unspecified site, unspecified stage: Secondary | ICD-10-CM | POA: Insufficient documentation

## 2015-10-18 DIAGNOSIS — R42 Dizziness and giddiness: Secondary | ICD-10-CM | POA: Diagnosis not present

## 2015-10-18 DIAGNOSIS — Z952 Presence of prosthetic heart valve: Secondary | ICD-10-CM | POA: Diagnosis not present

## 2015-10-18 DIAGNOSIS — I639 Cerebral infarction, unspecified: Secondary | ICD-10-CM

## 2015-10-18 LAB — URINALYSIS COMPLETE WITH MICROSCOPIC (ARMC ONLY)
BILIRUBIN URINE: NEGATIVE
GLUCOSE, UA: NEGATIVE mg/dL
KETONES UR: NEGATIVE mg/dL
LEUKOCYTES UA: NEGATIVE
Nitrite: NEGATIVE
Protein, ur: 30 mg/dL — AB
Specific Gravity, Urine: 1.003 — ABNORMAL LOW (ref 1.005–1.030)
pH: 6 (ref 5.0–8.0)

## 2015-10-18 LAB — CBC
HEMATOCRIT: 34.1 % — AB (ref 35.0–47.0)
Hemoglobin: 11.2 g/dL — ABNORMAL LOW (ref 12.0–16.0)
MCH: 29.8 pg (ref 26.0–34.0)
MCHC: 33 g/dL (ref 32.0–36.0)
MCV: 90.4 fL (ref 80.0–100.0)
PLATELETS: 94 10*3/uL — AB (ref 150–440)
RBC: 3.77 MIL/uL — ABNORMAL LOW (ref 3.80–5.20)
RDW: 16.5 % — AB (ref 11.5–14.5)
WBC: 8.2 10*3/uL (ref 3.6–11.0)

## 2015-10-18 LAB — BASIC METABOLIC PANEL
Anion gap: 5 (ref 5–15)
BUN: 22 mg/dL — AB (ref 6–20)
CHLORIDE: 96 mmol/L — AB (ref 101–111)
CO2: 29 mmol/L (ref 22–32)
CREATININE: 0.89 mg/dL (ref 0.44–1.00)
Calcium: 8.4 mg/dL — ABNORMAL LOW (ref 8.9–10.3)
GFR calc Af Amer: >60 mL/min (ref >60)
GFR calc non Af Amer: 55 mL/min — ABNORMAL LOW (ref >60)
GLUCOSE: 99 mg/dL (ref 65–99)
Potassium: 3.5 mmol/L (ref 3.5–5.1)
SODIUM: 130 mmol/L — AB (ref 135–145)

## 2015-10-18 NOTE — ED Triage Notes (Signed)
Patient with complaint of dizziness and weakness that started today. Patient with some dementia and unsure what time the dizziness started.

## 2015-10-19 ENCOUNTER — Observation Stay (HOSPITAL_BASED_OUTPATIENT_CLINIC_OR_DEPARTMENT_OTHER)
Admit: 2015-10-19 | Discharge: 2015-10-19 | Disposition: A | Discharge status: Home / Self Care | Payer: Medicare Other | Attending: Neurology | Admitting: Neurology

## 2015-10-19 ENCOUNTER — Observation Stay: Payer: Medicare Other

## 2015-10-19 ENCOUNTER — Emergency Department: Payer: Medicare Other

## 2015-10-19 ENCOUNTER — Encounter: Payer: Self-pay | Admitting: *Deleted

## 2015-10-19 DIAGNOSIS — I6350 Cerebral infarction due to unspecified occlusion or stenosis of unspecified cerebral artery: Secondary | ICD-10-CM

## 2015-10-19 DIAGNOSIS — R55 Syncope and collapse: Secondary | ICD-10-CM | POA: Diagnosis not present

## 2015-10-19 DIAGNOSIS — I610 Nontraumatic intracerebral hemorrhage in hemisphere, subcortical: Secondary | ICD-10-CM | POA: Diagnosis not present

## 2015-10-19 DIAGNOSIS — R531 Weakness: Secondary | ICD-10-CM

## 2015-10-19 DIAGNOSIS — L8990 Pressure ulcer of unspecified site, unspecified stage: Secondary | ICD-10-CM | POA: Insufficient documentation

## 2015-10-19 LAB — CBC
HCT: 34.1 % — ABNORMAL LOW (ref 35.0–47.0)
HEMOGLOBIN: 11.6 g/dL — AB (ref 12.0–16.0)
MCH: 29.9 pg (ref 26.0–34.0)
MCHC: 33.9 g/dL (ref 32.0–36.0)
MCV: 88.2 fL (ref 80.0–100.0)
Platelets: 94 10*3/uL — ABNORMAL LOW (ref 150–440)
RBC: 3.87 MIL/uL (ref 3.80–5.20)
RDW: 16.7 % — ABNORMAL HIGH (ref 11.5–14.5)
WBC: 7.5 10*3/uL (ref 3.6–11.0)

## 2015-10-19 LAB — BASIC METABOLIC PANEL
ANION GAP: 6 (ref 5–15)
BUN: 18 mg/dL (ref 6–20)
CALCIUM: 8.7 mg/dL — AB (ref 8.9–10.3)
CO2: 27 mmol/L (ref 22–32)
CREATININE: 0.77 mg/dL (ref 0.44–1.00)
Chloride: 105 mmol/L (ref 101–111)
GFR calc Af Amer: >60 mL/min (ref >60)
GFR calc non Af Amer: >60 mL/min (ref >60)
GLUCOSE: 86 mg/dL (ref 65–99)
Potassium: 3.2 mmol/L — ABNORMAL LOW (ref 3.5–5.1)
Sodium: 138 mmol/L (ref 135–145)

## 2015-10-19 LAB — GLUCOSE, CAPILLARY
GLUCOSE-CAPILLARY: 75 mg/dL (ref 65–99)
GLUCOSE-CAPILLARY: 76 mg/dL (ref 65–99)
GLUCOSE-CAPILLARY: 85 mg/dL (ref 65–99)
Glucose-Capillary: 87 mg/dL (ref 65–99)
Glucose-Capillary: 106 mg/dL — ABNORMAL HIGH (ref 65–99)

## 2015-10-19 LAB — MAGNESIUM: MAGNESIUM: 1.9 mg/dL (ref 1.7–2.4)

## 2015-10-19 LAB — PHOSPHORUS: Phosphorus: 3.3 mg/dL (ref 2.5–4.6)

## 2015-10-19 LAB — LIPID PANEL
CHOLESTEROL: 139 mg/dL (ref 0–200)
HDL: 48 mg/dL (ref >40)
LDL Cholesterol: 79 mg/dL (ref 0–99)
Total CHOL/HDL Ratio: 2.9 RATIO
Triglycerides: 62 mg/dL (ref <150)
VLDL: 12 mg/dL (ref 0–40)

## 2015-10-19 LAB — TROPONIN I: Troponin I: <0.03 ng/mL (ref <0.03)

## 2015-10-19 MED ORDER — ZOLPIDEM TARTRATE 5 MG PO TABS: 5.0000 mg | ORAL_TABLET | Freq: Every evening | ORAL | Status: DC | PRN

## 2015-10-19 MED ORDER — ONDANSETRON HCL 4 MG PO TABS: 4.0000 mg | ORAL_TABLET | Freq: Four times a day (QID) | ORAL | Status: DC | PRN

## 2015-10-19 MED ORDER — ASPIRIN EC 81 MG PO TBEC: 81.0000 mg | DELAYED_RELEASE_TABLET | Freq: Every day | ORAL | Status: DC

## 2015-10-19 MED ORDER — HYDROCHLOROTHIAZIDE 12.5 MG PO CAPS
12.5000 mg | ORAL_CAPSULE | Freq: Every day | ORAL | Status: DC
  Administered 2015-10-19: 11:00:00 12.5 mg via ORAL
  Filled 2015-10-19 (×2): qty 1

## 2015-10-19 MED ORDER — SODIUM CHLORIDE 0.9 % IV SOLN
INTRAVENOUS | Status: DC
  Administered 2015-10-19 (×2): via INTRAVENOUS

## 2015-10-19 MED ORDER — LISINOPRIL 10 MG PO TABS
10.0000 mg | ORAL_TABLET | Freq: Every day | ORAL | Status: DC
  Administered 2015-10-19: 11:00:00 10 mg via ORAL
  Filled 2015-10-19 (×2): qty 1

## 2015-10-19 MED ORDER — DOCUSATE SODIUM 100 MG PO CAPS
100.0000 mg | ORAL_CAPSULE | Freq: Two times a day (BID) | ORAL | Status: DC
  Administered 2015-10-19 – 2015-10-20 (×2): 100 mg via ORAL
  Filled 2015-10-19 (×3): qty 1

## 2015-10-19 MED ORDER — INSULIN ASPART 100 UNIT/ML ~~LOC~~ SOLN: 0.0000 [IU] | Freq: Every day | SUBCUTANEOUS | Status: DC

## 2015-10-19 MED ORDER — ACETAMINOPHEN 650 MG RE SUPP: 650.0000 mg | Freq: Four times a day (QID) | RECTAL | Status: DC | PRN

## 2015-10-19 MED ORDER — SENNOSIDES-DOCUSATE SODIUM 8.6-50 MG PO TABS: 1.0000 | ORAL_TABLET | Freq: Every evening | ORAL | Status: DC | PRN

## 2015-10-19 MED ORDER — SODIUM CHLORIDE 0.9 % IV BOLUS (SEPSIS)
500.0000 mL | Freq: Once | INTRAVENOUS | Status: AC
  Administered 2015-10-19: 500 mL via INTRAVENOUS

## 2015-10-19 MED ORDER — HYDROCODONE-ACETAMINOPHEN 5-325 MG PO TABS: 1.0000 | ORAL_TABLET | ORAL | Status: DC | PRN

## 2015-10-19 MED ORDER — BISACODYL 5 MG PO TBEC: 5.0000 mg | DELAYED_RELEASE_TABLET | Freq: Every day | ORAL | Status: DC | PRN

## 2015-10-19 MED ORDER — LISINOPRIL-HYDROCHLOROTHIAZIDE 10-12.5 MG PO TABS: 1.0000 | ORAL_TABLET | Freq: Every day | ORAL | Status: DC

## 2015-10-19 MED ORDER — MAGNESIUM CITRATE PO SOLN
1.0000 | Freq: Once | ORAL | Status: DC | PRN
  Filled 2015-10-19: qty 296

## 2015-10-19 MED ORDER — ALBUTEROL SULFATE (2.5 MG/3ML) 0.083% IN NEBU: 2.5000 mg | INHALATION_SOLUTION | Freq: Four times a day (QID) | RESPIRATORY_TRACT | Status: DC | PRN

## 2015-10-19 MED ORDER — SODIUM CHLORIDE 0.9% FLUSH
3.0000 mL | Freq: Two times a day (BID) | INTRAVENOUS | Status: DC
  Administered 2015-10-19 (×2): 3 mL via INTRAVENOUS

## 2015-10-19 MED ORDER — DONEPEZIL HCL 5 MG PO TABS
10.0000 mg | ORAL_TABLET | Freq: Every day | ORAL | Status: DC
  Administered 2015-10-19: 21:00:00 10 mg via ORAL
  Filled 2015-10-19: qty 2

## 2015-10-19 MED ORDER — FUROSEMIDE 20 MG PO TABS: 20.0000 mg | ORAL_TABLET | Freq: Every day | ORAL | Status: DC

## 2015-10-19 MED ORDER — ACETAMINOPHEN 325 MG PO TABS: 650.0000 mg | ORAL_TABLET | Freq: Four times a day (QID) | ORAL | Status: DC | PRN

## 2015-10-19 MED ORDER — ONDANSETRON HCL 4 MG/2ML IJ SOLN
4.0000 mg | Freq: Four times a day (QID) | INTRAMUSCULAR | Status: DC | PRN
  Administered 2015-10-19: 4 mg via INTRAVENOUS
  Filled 2015-10-19: qty 2

## 2015-10-19 MED ORDER — LEVALBUTEROL TARTRATE 45 MCG/ACT IN AERO: 1.0000 | INHALATION_SPRAY | Freq: Four times a day (QID) | RESPIRATORY_TRACT | Status: DC | PRN

## 2015-10-19 MED ORDER — ALBUTEROL SULFATE HFA 108 (90 BASE) MCG/ACT IN AERS: 2.0000 | INHALATION_SPRAY | Freq: Four times a day (QID) | RESPIRATORY_TRACT | Status: DC | PRN

## 2015-10-19 MED ORDER — INSULIN ASPART 100 UNIT/ML ~~LOC~~ SOLN: 0.0000 [IU] | Freq: Three times a day (TID) | SUBCUTANEOUS | Status: DC

## 2015-10-19 MED ORDER — GLUCERNA SHAKE PO LIQD
237.0000 mL | Freq: Three times a day (TID) | ORAL | Status: DC
  Administered 2015-10-19 – 2015-10-20 (×4): 237 mL via ORAL

## 2015-10-19 NOTE — Progress Notes (Signed)
Chaplain was making his rounds and visited with pt in room 116.  Provided the ministry of prayer and emotional support.    10/19/15 1045  Clinical Encounter Type  Visited With Patient and family together  Visit Type Initial;Spiritual support  Referral From Nurse  Spiritual Encounters  Spiritual Needs Prayer;Emotional

## 2015-10-19 NOTE — ED Notes (Signed)
Report to Gerald Stabs, to room 116.

## 2015-10-19 NOTE — ED Provider Notes (Signed)
[facility] Emergency Department Provider Note   ____________________________________________   First MD Initiated Contact with Patient 10/19/15 [phone]     (approximate)  I have reviewed the triage vital signs and the nursing notes.   HISTORY  Chief Complaint Dizziness and Weakness    HPI Ariana Miller is a 82 y.o. female who comes into the hospital today with dizziness and weakness. According to the patient's family she had been doing well except for some blood in her urine lately. The patient has an appointment to see urology but her family is concerned that she looks a little pale. Today the patient complained about being dizzy. The patient's family members that she went to church and when she came home she asked if her son had fed the patient. The patient had oatmeal and salad. She decided to check her blood pressure and blood sugar and they were both found to be high. The patient's blood pressure was [id number] systolic and her blood sugars were 220s. She reports that she had been only giving her a half a pill of lisinopril for her blood pressure but she decided to give her a whole pill. The family also thinks the patient may have back pain but the patient is denying that at this time. When the patient arrived to the hospital she reports that her dizziness is improved but the family member think she is lying so that she doesn't have to stay in the hospital. The patient's family states that she feels hot and is weak all over. She is concerned about her urine as it looked very pink today. She hasn't had any distinct chest pain or shortness of breath. She hasn't had any headache or blurry vision. She's had no vomiting. She has had ulcers in the past and has had some blood in her stools intermittently.   Past Medical History:  Diagnosis Date  . Asthma   . CAD (coronary artery disease)   . Diabetes mellitus without complication ([place])   . HTN (hypertension)      Patient Active Problem List   Diagnosis Date Noted  . Weakness 10/19/2015  . Reactive airway disease that is not asthma 03/02/2015  . Hyponatremia 03/02/2015  . Type 2 diabetes mellitus ([place]) 03/02/2015  . Accelerated hypertension 03/02/2015  . CAD (coronary artery disease) 03/02/2015    Past Surgical History:  Procedure Laterality Date  . AORTIC VALVE REPLACEMENT (AVR)/CORONARY ARTERY BYPASS GRAFTING (CABG)    . CAROTID ENDARTERECTOMY    . CHOLECYSTECTOMY      Prior to Admission medications   Medication Sig Start Date End Date Taking? Authorizing Provider  albuterol (PROVENTIL HFA;VENTOLIN HFA) 108 (90 Base) MCG/ACT inhaler Inhale 2 puffs into the lungs every 6 (six) hours as needed for wheezing or shortness of breath. 09/21/15   Daymon Larsen, MD  aspirin 81 MG chewable tablet Chew 81 mg by mouth daily.    Historical Provider, MD  donepezil (ARICEPT) 10 MG tablet Take 10 mg by mouth at bedtime.    Historical Provider, MD  feeding supplement, GLUCERNA SHAKE, (GLUCERNA SHAKE) LIQD Take 237 mLs by mouth 3 (three) times daily with meals. 03/05/15   Lytle Butte, MD  furosemide (LASIX) 20 MG tablet Take 1 tablet (20 mg total) by mouth daily. 09/21/15 09/23/16  Daymon Larsen, MD  glimepiride (AMARYL) 1 MG tablet Take 0.5 mg by mouth daily.    Historical Provider, MD  guaiFENesin-codeine 100-10 MG/5ML syrup Take 10 mLs by mouth every  4 (four) hours as needed for cough. 03/05/15   Lytle Butte, MD  levalbuterol [facility] HFA) 45 MCG/ACT inhaler Inhale 1 puff into the lungs every 6 (six) hours as needed for wheezing or shortness of breath. 03/05/15   Lytle Butte, MD  levofloxacin (LEVAQUIN) 250 MG tablet Take 1 tablet (250 mg total) by mouth daily. 03/05/15   Lytle Butte, MD  lisinopril-hydrochlorothiazide (PRINZIDE,ZESTORETIC) 10-12.5 MG tablet Take 1 tablet by mouth daily.    Historical Provider, MD  predniSONE (STERAPRED UNI-PAK 21 TAB) 10 MG (21) TBPK tablet 40mg  oral 1 day, then 20mg   oral for 2 days, then 10mg  oral 2 days, then stop 03/05/15   Lytle Butte, MD    Allergies Review of patient's allergies indicates no known allergies.  Family History  Problem Relation Age of Onset  . Hepatitis Mother   . Liver disease Father   . Asthma    . Osteoporosis    . Hypertension      Social History Social History  Substance Use Topics  . Smoking status: Former Research scientist (life sciences)  . Smokeless tobacco: Never Used  . Alcohol use No    Review of Systems Constitutional: No fever/chills Eyes: No visual changes. ENT: No sore throat. Cardiovascular: Denies chest pain. Respiratory: Denies shortness of breath. Gastrointestinal: No abdominal pain.  No nausea, no vomiting.  No diarrhea.  No constipation. Genitourinary: Hematuria Musculoskeletal: Negative for back pain. Skin: Negative for rash. Neurological: Dizziness  10-point ROS otherwise negative.  ____________________________________________   PHYSICAL EXAM:  VITAL SIGNS: ED Triage Vitals  Enc Vitals Group     BP 10/18/15 2148 (!) 175/51     Pulse Rate 10/18/15 2146 63     Resp 10/18/15 2146 18     Temp 10/18/15 2146 97.6 F (36.4 C)     Temp Source 10/18/15 2146 Oral     SpO2 10/18/15 2146 99 %     Weight 10/18/15 2147 152 lb (68.9 kg)     Height 10/18/15 2147 5' (1.524 m)     Head Circumference --      Peak Flow --      Pain Score --      Pain Loc --      Pain Edu? --      Excl. in [place]? --     Constitutional: Alert and oriented. Well appearing and in no acute distress. Eyes: Conjunctivae are normal. PERRL. EOMI. Head: Atraumatic. Nose: No congestion/rhinnorhea. Mouth/Throat: Mucous membranes are moist.  Oropharynx non-erythematous. Cardiovascular: Normal rate, regular rhythm. Grossly normal heart sounds.  Good peripheral circulation. Respiratory: Normal respiratory effort.  No retractions. Lungs CTAB. Gastrointestinal: Soft and nontender. No distention. Positive bowel sounds Musculoskeletal: No lower  extremity tenderness nor edema.   Neurologic:  Normal speech and language. Cranial nerves II through XII grossly intact with some left upper extremity weakness. Skin:  Skin is warm, dry and intact.  Psychiatric: Mood and affect are normal.   ____________________________________________   LABS (all labs ordered are listed, but only abnormal results are displayed)  Labs Reviewed  BASIC METABOLIC PANEL - Abnormal; Notable for the following:       Result Value   Sodium 130 (*)    Chloride 96 (*)    BUN 22 (*)    Calcium 8.4 (*)    GFR calc non Af Amer 55 (*)    All other components within normal limits  CBC - Abnormal; Notable for the following:    RBC 3.77 (*)  Hemoglobin 11.2 (*)    HCT 34.1 (*)    RDW 16.5 (*)    Platelets 94 (*)    All other components within normal limits  URINALYSIS COMPLETEWITH MICROSCOPIC ([facility] ONLY) - Abnormal; Notable for the following:    Color, Urine YELLOW (*)    APPearance CLEAR (*)    Specific Gravity, Urine 1.003 (*)    Hgb urine dipstick 3+ (*)    Protein, ur 30 (*)    Bacteria, UA RARE (*)    Squamous Epithelial / LPF 0-5 (*)    All other components within normal limits  TROPONIN I  GLUCOSE, CAPILLARY  CBG MONITORING, ED   ____________________________________________  EKG  ED ECG REPORT I, Loney Hering, the attending physician, personally viewed and interpreted this ECG.   Date: 10/18/2015  EKG Time: 2141  Rate: 70  Rhythm: normal sinus rhythm  Axis: normal  Intervals:right bundle branch block  ST&T Change: none  ____________________________________________  RADIOLOGY  CT head CT renal stone study ____________________________________________   PROCEDURES  Procedure(s) performed: None  Procedures  Critical Care performed: No  ____________________________________________   INITIAL IMPRESSION / ASSESSMENT AND PLAN / ED COURSE  Pertinent labs & imaging results that were available during my care of the  patient were reviewed by me and considered in my medical decision making (see chart for details).  This is a 82 year old female who comes into the hospital today with some dizziness. She does have some lower sodium which could be consistent of some dehydration. I'll send the patient for a CT scan of her head as well as a CT scan of her kidneys looking for possible stone or cause of the bleeding. The patient will be reassessed. I did give the patient a dose of normal saline 500 mls IV 1 dose.  Clinical Course  Value Comment By Time  CT Renal Stone Study Nodular soft tissue lesion arising from the dome of the bladder adjacent to the right UVJ. Further evaluation with cystoscopy recommended. No hydronephrosis or nephrolithiasis.  Constipation. No evidence of bowel obstruction or active inflammation.   Loney Hering, MD 10/16 [phone]  CT Head Wo Contrast Small hyperdense focus in left posterior frontal white matter may represent a acute parenchymal hemorrhage. Follow-up short interval head CT is recommended to evaluate for stability.   Loney Hering, MD 10/16 [phone]    It appears that the patient has a hyperdense focus in her left frontal white matter which is a concern for hemorrhagic stroke. The patient has no symptoms at this time and the left upper extremity weakness the patient's family contributes to the fact that she has a broken shoulder and has needed surgery for some time. The patient also appears to have a lesion near the dome of her bladder close to the right UVJ. Given the hyperdense focus I will admit the patient to the hospitalist. Radiology recommends repeating the study in 6 hours to determine if it is changing in size. As the patient has no acute symptoms I will not transfer her at this time. I will also recommend having the patient evaluated by neurology. She will be admitted to the hospitalist service. ____________________________________________   FINAL CLINICAL  IMPRESSION(S) / ED DIAGNOSES  Final diagnoses:  Dizziness  Hemorrhagic stroke (HCC)  Hematuria, unspecified type      NEW MEDICATIONS STARTED DURING THIS VISIT:  New Prescriptions   No medications on file     Note:  This document was prepared using Dragon  voice recognition software and may include unintentional dictation errors.    Loney Hering, MD 10/19/15 [phone]

## 2015-10-19 NOTE — H&P (Addendum)
[place] @ [facility] Admission History and Physical [employer], D.O.  ---------------------------------------------------------------------------------------------------------------------   PATIENT NAME: Ariana Miller MR#: [id number] DATE OF BIRTH: [birth year] DATE OF ADMISSION: 10/18/2015 PRIMARY CARE PHYSICIAN: Rusty Aus, MD  REQUESTING/REFERRING PHYSICIAN: ED Dr. Dahlia Client  CHIEF COMPLAINT: Chief Complaint  Patient presents with  . Dizziness  . Weakness    HISTORY OF PRESENT ILLNESS: Ariana Miller is a 82 y.o. female with a known history of Hypertension, diabetes, coronary artery disease and asthma, recent hematuria presents to the emergency department complaining of weakness and dizziness.  Patient was in a usual state of health until recently when she developed hematuria for which she has scheduled an appointment with urology. Her daughter brings her to the emergency department today with concerns weakness, dizziness and concern for possible anemia secondary to blood loss in the urine.  Patient was found to have hypertension at home with elevated blood pressure in the [id number] systolic.  On questioning patient denies any complaints at this time. She denies any headache, dizziness, weakness, nausea or vomiting. She does report pink to red urine which has been chronic and has plans to follow-up with urology  Otherwise there has been no change in status. Patient has been taking medication as prescribed and there has been no recent change in medication or diet.  There has been no recent illness, travel or sick contacts.    Patient denies fevers/chills, chest pain, shortness of breath, N/V/C/D, abdominal pain, dysuria/frequency, changes in mental status.   PAST MEDICAL HISTORY: Past Medical History:  Diagnosis Date  . Asthma   . CAD (coronary artery disease)   . Diabetes mellitus without complication ([place])   . HTN (hypertension)       PAST SURGICAL  HISTORY: Past Surgical History:  Procedure Laterality Date  . AORTIC VALVE REPLACEMENT (AVR)/CORONARY ARTERY BYPASS GRAFTING (CABG)    . CAROTID ENDARTERECTOMY    . CHOLECYSTECTOMY        SOCIAL HISTORY: Social History  Substance Use Topics  . Smoking status: Former Research scientist (life sciences)  . Smokeless tobacco: Never Used  . Alcohol use No      FAMILY HISTORY: Family History  Problem Relation Age of Onset  . Hepatitis Mother   . Liver disease Father   . Asthma    . Osteoporosis    . Hypertension       MEDICATIONS AT HOME: Prior to Admission medications   Medication Sig Start Date End Date Taking? Authorizing Provider  albuterol (PROVENTIL HFA;VENTOLIN HFA) 108 (90 Base) MCG/ACT inhaler Inhale 2 puffs into the lungs every 6 (six) hours as needed for wheezing or shortness of breath. 09/21/15   Daymon Larsen, MD  aspirin 81 MG chewable tablet Chew 81 mg by mouth daily.    Historical Provider, MD  donepezil (ARICEPT) 10 MG tablet Take 10 mg by mouth at bedtime.    Historical Provider, MD  feeding supplement, GLUCERNA SHAKE, (GLUCERNA SHAKE) LIQD Take 237 mLs by mouth 3 (three) times daily with meals. 03/05/15   Lytle Butte, MD  furosemide (LASIX) 20 MG tablet Take 1 tablet (20 mg total) by mouth daily. 09/21/15 09/23/16  Daymon Larsen, MD  glimepiride (AMARYL) 1 MG tablet Take 0.5 mg by mouth daily.    Historical Provider, MD  guaiFENesin-codeine 100-10 MG/5ML syrup Take 10 mLs by mouth every 4 (four) hours as needed for cough. 03/05/15   Lytle Butte, MD  levalbuterol [facility] HFA) 45 MCG/ACT inhaler Inhale 1 puff into the  lungs every 6 (six) hours as needed for wheezing or shortness of breath. 03/05/15   Lytle Butte, MD  levofloxacin (LEVAQUIN) 250 MG tablet Take 1 tablet (250 mg total) by mouth daily. 03/05/15   Lytle Butte, MD  lisinopril-hydrochlorothiazide (PRINZIDE,ZESTORETIC) 10-12.5 MG tablet Take 1 tablet by mouth daily.    Historical Provider, MD  predniSONE (STERAPRED UNI-PAK 21 TAB)  10 MG (21) TBPK tablet 40mg  oral 1 day, then 20mg  oral for 2 days, then 10mg  oral 2 days, then stop 03/05/15   Lytle Butte, MD      DRUG ALLERGIES: No Known Allergies   REVIEW OF SYSTEMS: CONSTITUTIONAL: No fatigue, weakness, fever, chills, weight gain/loss, headache EYES: No blurry or double vision. ENT: No tinnitus, postnasal drip, redness or soreness of the oropharynx. RESPIRATORY: No dyspnea, cough, wheeze, hemoptysis. CARDIOVASCULAR: No chest pain, orthopnea, palpitations, syncope. GASTROINTESTINAL: No nausea, vomiting, constipation, diarrhea, abdominal pain. No hematemesis, melena or hematochezia. GENITOURINARY: No dysuria, frequency, Positive hematuria. ENDOCRINE: No polyuria or nocturia. No heat or cold intolerance. HEMATOLOGY: No anemia, bruising, bleeding. INTEGUMENTARY: No rashes, ulcers, lesions. MUSCULOSKELETAL: No pain, arthritis, swelling, gout. NEUROLOGIC: No numbness, tingling, weakness or ataxia. No seizure-type activity. PSYCHIATRIC: No anxiety, depression, insomnia.  PHYSICAL EXAMINATION: VITAL SIGNS: Blood pressure (!) 126/44, pulse (!) 56, temperature 97.6 F (36.4 C), temperature source Oral, resp. rate 17, height 5' (1.524 m), weight 68.9 kg (152 lb), SpO2 98 %.  GENERAL: 82 y.o.-year old Hispanic female patient, well-developed, well-nourished lying in the bed in no acute distress.  Pleasant and cooperative.   HEENT: Head atraumatic, normocephalic. Pupils equal, round, reactive to light and accommodation. No scleral icterus. Extraocular muscles intact. Oropharynx is clear. Mucus membranes moist. NECK: Supple, full range of motion. No JVD, no bruit heard. No cervical lymphadenopathy. CHEST: Normal breath sounds bilaterally. No wheezing, rales, rhonchi or crackles. No use of accessory muscles of respiration.  No reproducible chest wall tenderness.  CARDIOVASCULAR: S1, S2 normal. No murmurs, rubs, or gallops appreciated. Cap refill <2 seconds. ABDOMEN: Soft,  nontender, nondistended. No rebound, guarding, rigidity. Normoactive bowel sounds present in all four quadrants. No organomegaly or mass. EXTREMITIES: Full range of motion. No pedal edema, cyanosis, or clubbing. NEUROLOGIC: Cranial nerves II through XII are grossly intact with no focal sensorimotor deficit. Muscle strength 5/5 in all extremities. Sensation intact. Gait not checked. PSYCHIATRIC: The patient is alert and oriented x 3. Normal affect, mood, thought content. SKIN: Warm, dry, and intact without obvious rash, lesion, or ulcer.  LABORATORY PANEL:  CBC  Recent Labs Lab 10/18/15 2154  WBC 8.2  HGB 11.2*  HCT 34.1*  PLT 94*   ----------------------------------------------------------------------------------------------------------------- Chemistries  Recent Labs Lab 10/18/15 2154  NA 130*  K 3.5  CL 96*  CO2 29  GLUCOSE 99  BUN 22*  CREATININE 0.89  CALCIUM 8.4*   ------------------------------------------------------------------------------------------------------------------ Cardiac Enzymes  Recent Labs Lab 10/18/15 2154  TROPONINI <0.03   ------------------------------------------------------------------------------------------------------------------  RADIOLOGY: Ct Head Wo Contrast  Result Date: 10/19/2015 CLINICAL DATA:  82 y/o F; dizziness, hematuria, and weakness starting today. EXAM: CT HEAD WITHOUT CONTRAST TECHNIQUE: Contiguous axial images were obtained from the base of the skull through the vertex without intravenous contrast. COMPARISON:  06/08/2008 CT head FINDINGS: Brain: 3 mm hyperdense focus in left posterior frontal white matter may present on prior CT may represent a parenchymal hemorrhage. No associated edema. No evidence for large territory infarct. Stable mild chronic microvascular ischemic changes and parenchymal volume loss. Vascular: No hyperdense vessel. Extensive calcific atherosclerosis  of carotid siphons and vertebral arteries. Skull:  Normal. Negative for fracture or focal lesion. Sinuses/Orbits: No acute finding. Other: None. IMPRESSION: Small hyperdense focus in left posterior frontal white matter may represent a acute parenchymal hemorrhage. Follow-up short interval head CT is recommended to evaluate for stability. These results were called by telephone at the time of interpretation on 10/19/2015 at 2:53 am to Dr. Charlesetta Ivory , who verbally acknowledged these results. Electronically Signed   By: Kristine Garbe M.D.   On: 10/19/2015 02:53   Ct Renal Stone Study  Result Date: 10/19/2015 CLINICAL DATA:  82 year old female with hematuria EXAM: CT ABDOMEN AND PELVIS WITHOUT CONTRAST TECHNIQUE: Multidetector CT imaging of the abdomen and pelvis was performed following the standard protocol without IV contrast. COMPARISON:  CT dated 11/04/2009 FINDINGS: Evaluation of this exam is limited in the absence of intravenous contrast. Lower chest: Emphysematous changes of the lung bases with interstitial coarsening. There is heavy calcification of the mitral annulus as well as coronary vascular calcification. No intra-abdominal free air or free fluid. Hepatobiliary: Cholecystectomy. There is irregularity of the hepatic contour concerning for underlying cirrhosis. Clinical correlation is recommended. No intrahepatic biliary ductal dilatation. Pancreas: Unremarkable. No pancreatic ductal dilatation or surrounding inflammatory changes. Spleen: Normal in size without focal abnormality. Adrenals/Urinary Tract: There is no hydronephrosis or nephrolithiasis on either side. The visualized ureters appear grossly unremarkable. There is a 1.2 x 1.5 cm nodular soft tissue lesion arising from the dome of the bladder adjacent to the right UVJ (series 2, image 60) concerning for neoplasm. Further evaluation with cystoscopy recommended. Stomach/Bowel: Constipation. Small scattered colonic diverticula. There is no evidence of bowel obstruction or active  inflammation. The appendix is not visualized with certainty. No inflammatory changes identified in the right lower quadrant. Vascular/Lymphatic: There is advanced aortoiliac atherosclerotic disease. The aorta is ectatic and measures up to 2.2 cm in diameter. No portal venous gas identified. Evaluation of the vasculature is limited in the absence of intravenous contrast. Reproductive: The uterus and the ovaries are grossly unremarkable. Other: None Musculoskeletal: Advanced osteopenia with degenerative changes of the spine and hips. No definite acute fracture. IMPRESSION: Nodular soft tissue lesion arising from the dome of the bladder adjacent to the right UVJ. Further evaluation with cystoscopy recommended. No hydronephrosis or nephrolithiasis. Constipation. No evidence of bowel obstruction or active inflammation. Electronically Signed   By: Anner Crete M.D.   On: 10/19/2015 02:56    EKG: Normal sinus rhythm at70 bpm with normal axis and nonspecific ST-T wave changes.  right bundle branch block  IMPRESSION AND PLAN:  This is a 82 y.o. female with a history of Hypertension, diabetes, coronary artery disease and asthma, recent hematuria now being admitted with: 1.  Head CT concerning for punctate hemorrhage-admit for observation, repeat CT brain per recommendation of radiology. Neuro exam is nonfocal however we'll follow neuro checks and request neurology consultation given symptoms of weakness and dizziness. Hold aspirin for now.  2.  Hyponatremia with weakness-gentle IV fluid hydration and repeat BMP in a.m. 3.  Hematuria with soft tissue mass of the bladder-patient has outpatient urology follow-up established. CBC is stable 4. Anemia with normocytic cytopenia, likely secondary to #3-monitor CBC and transfuse as needed. 5. History of diabetes-Accu-Cheks at before meals and at bedtime with insulin size, coverage 6.History of hypertension-continue Zestoretic 7. History of dementia-continue  Aricept   Diet/Nutrition:  heart healthy, carb controlled Fluids:  IV normal saline DVT Px: SCDs and early ambulation chemoprophylaxis will be contraindicated at this  point secondary to the concern for acute intracranial hemorrhage.  Code Status: Full  All the records are reviewed and case discussed with ED provider. Management plans discussed with the patient and/or family who express understanding and agree with plan of care.   TOTAL TIME TAKING CARE OF THIS PATIENT: 60 minutes.   Raeden Schippers D.O. on 10/19/2015 at 6:09 AM Between 7am to 6pm - Pager - [phone] After 6pm go to [email or web address] - [employer] [facility] Office [phone] CC: Primary care physician; Rusty Aus, MD     Note: This dictation was prepared with Dragon dictation along with smaller phrase technology. Any transcriptional errors that result from this process are unintentional.

## 2015-10-19 NOTE — ED Notes (Signed)
Assisted patient to commode.  

## 2015-10-19 NOTE — ED Notes (Signed)
Admitting MD at bedside.

## 2015-10-19 NOTE — Progress Notes (Signed)
*  PRELIMINARY RESULTS* Echocardiogram 2D Echocardiogram has been performed.  Sherrie Sport 10/19/2015, 2:11 PM

## 2015-10-19 NOTE — Progress Notes (Signed)
[facility] [place] NAME: Ariana Miller    MR#:  [id number]  DATE OF BIRTH:  12-Jan-1923  SUBJECTIVE:  CHIEF COMPLAINT:  Patient is resting comfortably. Denies any chest pain or dizziness while resting. Denies any blurry vision or headache  REVIEW OF SYSTEMS:  CONSTITUTIONAL: No fever, fatigue or weakness.  EYES: No blurred or double vision.  EARS, NOSE, AND THROAT: No tinnitus or ear pain.  RESPIRATORY: No cough, shortness of breath, wheezing or hemoptysis.  CARDIOVASCULAR: No chest pain, orthopnea, edema.  GASTROINTESTINAL: No nausea, vomiting, diarrhea or abdominal pain.  GENITOURINARY: No dysuria, hematuria.  ENDOCRINE: No polyuria, nocturia,  HEMATOLOGY: No anemia, easy bruising or bleeding SKIN: No rash or lesion. MUSCULOSKELETAL: No joint pain or arthritis.   NEUROLOGIC: No tingling, numbness, weakness.  PSYCHIATRY: No anxiety or depression.   DRUG ALLERGIES:  No Known Allergies  VITALS:  Blood pressure (!) 143/50, pulse 60, temperature 98.6 F (37 C), temperature source Oral, resp. rate 16, height 5' (1.524 m), weight 69.9 kg (154 lb 3.2 oz), SpO2 100 %.  PHYSICAL EXAMINATION:  GENERAL:  82 y.o.-year-old patient lying in the bed with no acute distress.  EYES: Pupils equal, round, reactive to light and accommodation. No scleral icterus. Extraocular muscles intact.  HEENT: Head atraumatic, normocephalic. Oropharynx and nasopharynx clear.  NECK:  Supple, no jugular venous distention. No thyroid enlargement, no tenderness.  LUNGS: Normal breath sounds bilaterally, no wheezing, rales,rhonchi or crepitation. No use of accessory muscles of respiration.  CARDIOVASCULAR: S1, S2 normal. No murmurs, rubs, or gallops.  ABDOMEN: Soft, nontender, nondistended. Bowel sounds present. No organomegaly or mass.  EXTREMITIES: No pedal edema, cyanosis, or clubbing.  NEUROLOGIC: Cranial nerves II through XII are intact. Muscle strength At  her baseline in all extremities. Sensation intact. Gait not checked.  PSYCHIATRIC: The patient is alert and oriented x 3.  SKIN: No obvious rash, lesion, or ulcer.    LABORATORY PANEL:   CBC  Recent Labs Lab 10/19/15 1044  WBC 7.5  HGB 11.6*  HCT 34.1*  PLT 94*   ------------------------------------------------------------------------------------------------------------------  Chemistries   Recent Labs Lab 10/19/15 1044  NA 138  K 3.2*  CL 105  CO2 27  GLUCOSE 86  BUN 18  CREATININE 0.77  CALCIUM 8.7*  MG 1.9   ------------------------------------------------------------------------------------------------------------------  Cardiac Enzymes  Recent Labs Lab 10/18/15 2154  TROPONINI <0.03   ------------------------------------------------------------------------------------------------------------------  RADIOLOGY:  Ct Head Wo Contrast  Result Date: 10/19/2015 CLINICAL DATA:  82 y/o F; dizziness, hematuria, and weakness starting today. EXAM: CT HEAD WITHOUT CONTRAST TECHNIQUE: Contiguous axial images were obtained from the base of the skull through the vertex without intravenous contrast. COMPARISON:  06/08/2008 CT head FINDINGS: Brain: 3 mm hyperdense focus in left posterior frontal white matter may present on prior CT may represent a parenchymal hemorrhage. No associated edema. No evidence for large territory infarct. Stable mild chronic microvascular ischemic changes and parenchymal volume loss. Vascular: No hyperdense vessel. Extensive calcific atherosclerosis of carotid siphons and vertebral arteries. Skull: Normal. Negative for fracture or focal lesion. Sinuses/Orbits: No acute finding. Other: None. IMPRESSION: Small hyperdense focus in left posterior frontal white matter may represent a acute parenchymal hemorrhage. Follow-up short interval head CT is recommended to evaluate for stability. These results were called by telephone at the time of interpretation on  10/19/2015 at 2:53 am to Dr. Charlesetta Ivory , who verbally acknowledged these results. Electronically Signed   By: Edgardo Roys.D.  On: 10/19/2015 02:53   US Carotid Bilateral  Result Date: 10/19/2015 CLINICAL DATA:  Stroke. EXAM: BILATERAL CAROTID DUPLEX ULTRASOUND TECHNIQUE: Pearline Cables scale imaging, color Doppler and duplex ultrasound were performed of bilateral carotid and vertebral arteries in the neck. COMPARISON:  CTA neck 09/13/2006 FINDINGS: Criteria: Quantification of carotid stenosis is based on velocity parameters that correlate the residual internal carotid diameter with NASCET-based stenosis levels, using the diameter of the distal internal carotid lumen as the denominator for stenosis measurement. The following velocity measurements were obtained: RIGHT ICA:  88 cm/sec CCA:  92 cm/sec SYSTOLIC ICA/CCA RATIO:  1 DIASTOLIC ICA/CCA RATIO:  1.2 ECA:  80 cm/sec LEFT ICA:  110 cm/sec CCA:  123456 cm/sec SYSTOLIC ICA/CCA RATIO:  0.9 DIASTOLIC ICA/CCA RATIO:  1.0 ECA:  80 cm/sec RIGHT CAROTID ARTERY: Small amount of echogenic plaque at the right carotid bulb. Shadowing at the origin of the internal carotid artery due to calcifications. Limited visualization of the proximal internal carotid artery due to the calcifications. Normal waveforms and velocities in the mid and distal right internal carotid artery. RIGHT VERTEBRAL ARTERY: Antegrade flow and normal waveform in the right vertebral artery. LEFT CAROTID ARTERY: Previously, the patient had a large amount of calcified plaque at the left carotid bulb. The left carotid bulb and proximal internal carotid artery are now widely patent and suspect an endarterectomy. Normal waveforms and velocities throughout the left internal carotid artery. LEFT VERTEBRAL ARTERY: Antegrade flow and normal waveform in the left vertebral artery. IMPRESSION: Mild atherosclerotic disease in the carotid arteries. Estimated degree of stenosis in the internal carotid  arteries is less than 50% bilaterally. Evidence for left carotid endarterectomy. Patent vertebral arteries. Electronically Signed   By: Markus Daft M.D.   On: 10/19/2015 15:12   Ct Renal Stone Study  Result Date: 10/19/2015 CLINICAL DATA:  82 year old female with hematuria EXAM: CT ABDOMEN AND PELVIS WITHOUT CONTRAST TECHNIQUE: Multidetector CT imaging of the abdomen and pelvis was performed following the standard protocol without IV contrast. COMPARISON:  CT dated 11/04/2009 FINDINGS: Evaluation of this exam is limited in the absence of intravenous contrast. Lower chest: Emphysematous changes of the lung bases with interstitial coarsening. There is heavy calcification of the mitral annulus as well as coronary vascular calcification. No intra-abdominal free air or free fluid. Hepatobiliary: Cholecystectomy. There is irregularity of the hepatic contour concerning for underlying cirrhosis. Clinical correlation is recommended. No intrahepatic biliary ductal dilatation. Pancreas: Unremarkable. No pancreatic ductal dilatation or surrounding inflammatory changes. Spleen: Normal in size without focal abnormality. Adrenals/Urinary Tract: There is no hydronephrosis or nephrolithiasis on either side. The visualized ureters appear grossly unremarkable. There is a 1.2 x 1.5 cm nodular soft tissue lesion arising from the dome of the bladder adjacent to the right UVJ (series 2, image 60) concerning for neoplasm. Further evaluation with cystoscopy recommended. Stomach/Bowel: Constipation. Small scattered colonic diverticula. There is no evidence of bowel obstruction or active inflammation. The appendix is not visualized with certainty. No inflammatory changes identified in the right lower quadrant. Vascular/Lymphatic: There is advanced aortoiliac atherosclerotic disease. The aorta is ectatic and measures up to 2.2 cm in diameter. No portal venous gas identified. Evaluation of the vasculature is limited in the absence of  intravenous contrast. Reproductive: The uterus and the ovaries are grossly unremarkable. Other: None Musculoskeletal: Advanced osteopenia with degenerative changes of the spine and hips. No definite acute fracture. IMPRESSION: Nodular soft tissue lesion arising from the dome of the bladder adjacent to the right UVJ. Further evaluation with cystoscopy  recommended. No hydronephrosis or nephrolithiasis. Constipation. No evidence of bowel obstruction or active inflammation. Electronically Signed   By: Anner Crete M.D.   On: 10/19/2015 02:56    EKG:   Orders placed or performed during the hospital encounter of 10/18/15  . EKG 12-Lead  . EKG 12-Lead  . ED EKG  . ED EKG    ASSESSMENT AND PLAN:   This is a 82 y.o. female with a history of Hypertension, diabetes, coronary artery disease and asthma, recent hematuria now being admitted with: 1. Dizziness/ near syncope  Head CT concerning for punctate intracranial hemorrhage-admit for observation,  repeat CT brain per recommendation of radiology.  Neuro exam is nonfocal so far, we'll follow neuro checks  Appreciate neurology recommendations given symptoms of weakness and dizziness.  Start aspirin per neurology recommendations for prophylaxis  PT, OT and speech therapy consult Carotid Dopplers with less than 50% internal carotid stenosis. Status post left carotid endarterectomy Echocardiogram pending  2.  Hyponatremia and hypokalemia with weakness-gentle IV fluid hydration and repeat BMP in a.m.  3.  Hematuria with soft tissue mass of the bladder-patient has outpatient urology follow-up established. Follow-up appointment is on October 30 according to the daughter CBC is stable  4. Anemia with normocytic cytopenia, likely secondary to #3-monitor CBC and transfuse as needed.  5. History of diabetes-Accu-Cheks at before meals and at bedtime with insulin ss coverage  6.History of hypertension-continue Zestoretic  7. History of  dementia-continue Aricept  Patient is DO NOT RESUSCITATE   All the records are reviewed and case discussed with Care Management/Social Workerr. Management plans discussed with the patient With the help of Spanish interpreter mR. Ronnald Collum and daughter  and they are in agreement.  CODE STATUS: DNR  TOTAL TIME TAKING CARE OF THIS PATIENT: 36 minutes.   POSSIBLE D/C IN 1-2 DAYS, DEPENDING ON CLINICAL CONDITION.  Note: This dictation was prepared with Dragon dictation along with smaller phrase technology. Any transcriptional errors that result from this process are unintentional.   Nicholes Mango M.D on 10/19/2015 at 4:47 PM  Between 7am to 6pm - Pager - [phone] After 6pm go to [email or web address] - password [email or web address] [facility]  Office  [phone]  CC: Primary care physician; Rusty Aus, MD

## 2015-10-19 NOTE — Care Management Obs Status (Signed)
[facility] NOTIFICATION   Patient Details  Name: Ariana Miller MRN: [id number] Date of Birth: September 26, 1923   Medicare Observation Status Notification Given:  Yes Signed by daughter    Beau Fanny, RN 10/19/2015, 8:06 AM

## 2015-10-19 NOTE — ED Notes (Signed)
Patient transported to CT 

## 2015-10-19 NOTE — Consult Note (Signed)
Referring Physician: Gouru    Chief Complaint: Dizziness  HPI: Ariana Miller is an 82 y.o. female with a history of dementia.  History provided by daughter.  Patient has had intermittent complaints of dizziness for many months.  On the day of presentation the patient reported feeling poorly and being shaky.  Blood pressure was taken and was elevated.  Blood sugar was elevated as well.  Patient was brought in for evaluation.    Date last known well: Unable to determine Time last known well: Unable to determine tPA Given: No: Unable to determine, ICH  ICH Score: 1       Past Medical History:  Diagnosis Date  . Asthma   . CAD (coronary artery disease)   . Diabetes mellitus without complication ([facility])   . HTN (hypertension)     Past Surgical History:  Procedure Laterality Date  . AORTIC VALVE REPLACEMENT (AVR)/CORONARY ARTERY BYPASS GRAFTING (CABG)    . CAROTID ENDARTERECTOMY    . CHOLECYSTECTOMY      Family History  Problem Relation Age of Onset  . Hepatitis Mother   . Liver disease Father   . Asthma    . Osteoporosis    . Hypertension     Social History:  reports that she has quit smoking. She has never used smokeless tobacco. She reports that she does not drink alcohol or use drugs.  Allergies: No Known Allergies  Medications:  I have reviewed the patient's current medications. Prior to Admission:  Prescriptions Prior to Admission  Medication Sig Dispense Refill Last Dose  . aspirin 81 MG chewable tablet Chew 81 mg by mouth daily.   10/18/2015 at 1900  . donepezil (ARICEPT) 10 MG tablet Take 10 mg by mouth at bedtime.   10/17/2015 at 2000  . glimepiride (AMARYL) 1 MG tablet Take 0.5 mg by mouth daily.   10/18/2015 at 1900  . lisinopril-hydrochlorothiazide (PRINZIDE,ZESTORETIC) 10-12.5 MG tablet Take 0.5 tablets by mouth daily.    10/18/2015 at 1900   Scheduled: . donepezil  10 mg Oral QHS  . feeding supplement (GLUCERNA SHAKE)  237 mL Oral TID WC  . furosemide   20 mg Oral Daily  . lisinopril  10 mg Oral Daily   And  . hydrochlorothiazide  12.5 mg Oral Daily  . insulin aspart  0-15 Units Subcutaneous TID WC  . insulin aspart  0-5 Units Subcutaneous QHS  . sodium chloride flush  3 mL Intravenous Q12H    ROS: History obtained from daughter  General ROS: negative for - chills, fatigue, fever, night sweats, weight gain or weight loss Psychological ROS: negative for - behavioral disorder, hallucinations, memory difficulties, mood swings or suicidal ideation Ophthalmic ROS: negative for - blurry vision, double vision, eye pain or loss of vision ENT ROS: negative for - epistaxis, nasal discharge, oral lesions, sore throat, tinnitus  Allergy and Immunology ROS: negative for - hives or itchy/watery eyes Hematological and Lymphatic ROS: negative for - bleeding problems, bruising or swollen lymph nodes Endocrine ROS: negative for - galactorrhea, hair pattern changes, polydipsia/polyuria or temperature intolerance Respiratory ROS: negative for - cough, hemoptysis, shortness of breath or wheezing Cardiovascular ROS: negative for - chest pain, dyspnea on exertion, edema or irregular heartbeat Gastrointestinal ROS: negative for - abdominal pain, diarrhea, hematemesis, nausea/vomiting or stool incontinence Genito-Urinary ROS: negative for - dysuria, hematuria, incontinence or urinary frequency/urgency Musculoskeletal ROS: negative for - joint swelling or muscular weakness Neurological ROS: as noted in HPI Dermatological ROS: negative for rash and skin  lesion changes  Physical Examination: Blood pressure (!) 163/60, pulse 68, temperature 98.1 F (36.7 C), temperature source Oral, resp. rate 16, height 5' (1.524 m), weight 69.9 kg (154 lb 3.2 oz), SpO2 100 %.  HEENT-  Normocephalic, no lesions, without obvious abnormality.  Normal external eye and conjunctiva.  Normal TM's bilaterally.  Normal auditory canals and external ears. Normal external nose, mucus  membranes and septum.  Normal pharynx. Cardiovascular- S1, S2 normal, pulses palpable throughout   Lungs- chest clear, no wheezing, rales, normal symmetric air entry Abdomen- soft, non-tender; bowel sounds normal; no masses,  no organomegaly Extremities- no edema Lymph-no adenopathy palpable Musculoskeletal-left shoulder pain Skin-warm and dry, no hyperpigmentation, vitiligo, or suspicious lesions  Neurological Examination Mental Status: Alert, responds appropriately to questions in Spanish.  Speech fluent without evidence of aphasia.  Able to follow simple commands without difficulty.  Requires some reinforcement for three step commands. Cranial Nerves: II: Discs flat bilaterally; Visual fields grossly normal, pupils equal, round, reactive to light and accommodation III,IV, VI: ptosis not present, extra-ocular motions intact bilaterally V,VII: smile symmetric, facial light touch sensation normal bilaterally VIII: hearing normal bilaterally IX,X: gag reflex present XI: bilateral shoulder shrug XII: midline tongue extension Motor: Generalized weakness with no focality noted other than as related to decreased ROM in the LUE.   Sensory: Pinprick and light touch intact throughout, bilaterally Deep Tendon Reflexes: 2+ and symmetric with absent AJ's bilaterally Plantars: Right: mute   Left: mute Cerebellar: Normal finger-to-nose and normal heel-to-shin testing bilaterally Gait: not tested due to safety concerns   Laboratory Studies:  Basic Metabolic Panel:  Recent Labs Lab 10/18/15 2154 10/19/15 1044  NA 130* 138  K 3.5 3.2*  CL 96* 105  CO2 29 27  GLUCOSE 99 86  BUN 22* 18  CREATININE 0.89 0.77  CALCIUM 8.4* 8.7*  MG  --  1.9  PHOS  --  3.3    Liver Function Tests: No results for input(s): AST, ALT, ALKPHOS, BILITOT, PROT, ALBUMIN in the last 168 hours. No results for input(s): LIPASE, AMYLASE in the last 168 hours. No results for input(s): AMMONIA in the last 168  hours.  CBC:  Recent Labs Lab 10/18/15 2154 10/19/15 1044  WBC 8.2 7.5  HGB 11.2* 11.6*  HCT 34.1* 34.1*  MCV 90.4 88.2  PLT 94* 94*    Cardiac Enzymes:  Recent Labs Lab 10/18/15 2154  TROPONINI <0.03    BNP: Invalid input(s): POCBNP  CBG:  Recent Labs Lab 10/19/15 0222 10/19/15 1142  GLUCAP 87 85    Microbiology: No results found for this or any previous visit.  Coagulation Studies: No results for input(s): LABPROT, INR in the last 72 hours.  Urinalysis:  Recent Labs Lab 10/18/15 2154  COLORURINE YELLOW*  LABSPEC 1.003*  PHURINE 6.0  GLUCOSEU NEGATIVE  HGBUR 3+*  BILIRUBINUR NEGATIVE  KETONESUR NEGATIVE  PROTEINUR 30*  NITRITE NEGATIVE  LEUKOCYTESUR NEGATIVE    Lipid Panel: No results found for: CHOL, TRIG, HDL, CHOLHDL, VLDL, LDLCALC  HgbA1C: No results found for: HGBA1C  Urine Drug Screen:  No results found for: LABOPIA, COCAINSCRNUR, LABBENZ, AMPHETMU, THCU, LABBARB  Alcohol Level: No results for input(s): ETH in the last 168 hours.  Other results: EKG: normal sinus rhythm at 70 bpm with sinus arhythmia  Imaging: Ct Head Wo Contrast  Result Date: 10/19/2015 CLINICAL DATA:  81 y/o F; dizziness, hematuria, and weakness starting today. EXAM: CT HEAD WITHOUT CONTRAST TECHNIQUE: Contiguous axial images were obtained from the base of  the skull through the vertex without intravenous contrast. COMPARISON:  06/08/2008 CT head FINDINGS: Brain: 3 mm hyperdense focus in left posterior frontal white matter may present on prior CT may represent a parenchymal hemorrhage. No associated edema. No evidence for large territory infarct. Stable mild chronic microvascular ischemic changes and parenchymal volume loss. Vascular: No hyperdense vessel. Extensive calcific atherosclerosis of carotid siphons and vertebral arteries. Skull: Normal. Negative for fracture or focal lesion. Sinuses/Orbits: No acute finding. Other: None. IMPRESSION: Small hyperdense focus in  left posterior frontal white matter may represent a acute parenchymal hemorrhage. Follow-up short interval head CT is recommended to evaluate for stability. These results were called by telephone at the time of interpretation on 10/19/2015 at 2:53 am to Dr. Charlesetta Ivory , who verbally acknowledged these results. Electronically Signed   By: Kristine Garbe M.D.   On: 10/19/2015 02:53   Ct Renal Stone Study  Result Date: 10/19/2015 CLINICAL DATA:  82 year old female with hematuria EXAM: CT ABDOMEN AND PELVIS WITHOUT CONTRAST TECHNIQUE: Multidetector CT imaging of the abdomen and pelvis was performed following the standard protocol without IV contrast. COMPARISON:  CT dated 11/04/2009 FINDINGS: Evaluation of this exam is limited in the absence of intravenous contrast. Lower chest: Emphysematous changes of the lung bases with interstitial coarsening. There is heavy calcification of the mitral annulus as well as coronary vascular calcification. No intra-abdominal free air or free fluid. Hepatobiliary: Cholecystectomy. There is irregularity of the hepatic contour concerning for underlying cirrhosis. Clinical correlation is recommended. No intrahepatic biliary ductal dilatation. Pancreas: Unremarkable. No pancreatic ductal dilatation or surrounding inflammatory changes. Spleen: Normal in size without focal abnormality. Adrenals/Urinary Tract: There is no hydronephrosis or nephrolithiasis on either side. The visualized ureters appear grossly unremarkable. There is a 1.2 x 1.5 cm nodular soft tissue lesion arising from the dome of the bladder adjacent to the right UVJ (series 2, image 60) concerning for neoplasm. Further evaluation with cystoscopy recommended. Stomach/Bowel: Constipation. Small scattered colonic diverticula. There is no evidence of bowel obstruction or active inflammation. The appendix is not visualized with certainty. No inflammatory changes identified in the right lower quadrant.  Vascular/Lymphatic: There is advanced aortoiliac atherosclerotic disease. The aorta is ectatic and measures up to 2.2 cm in diameter. No portal venous gas identified. Evaluation of the vasculature is limited in the absence of intravenous contrast. Reproductive: The uterus and the ovaries are grossly unremarkable. Other: None Musculoskeletal: Advanced osteopenia with degenerative changes of the spine and hips. No definite acute fracture. IMPRESSION: Nodular soft tissue lesion arising from the dome of the bladder adjacent to the right UVJ. Further evaluation with cystoscopy recommended. No hydronephrosis or nephrolithiasis. Constipation. No evidence of bowel obstruction or active inflammation. Electronically Signed   By: Anner Crete M.D.   On: 10/19/2015 02:56    Assessment: 82 y.o. female presenting with dizziness.  Head CT personally reviewed and shows a small left frontal ICH.  ICH score low.  Doubt this is related to presenting symptoms other than elevated blood pressure.  Patient on ASA daily for CAD.    Stroke Risk Factors - diabetes mellitus and hypertension  Plan: 1. HgbA1c, fasting lipid panel 2. Repeat head CT in AM 3. PT consult, OT consult, Speech consult 4. Echocardiogram 5. Carotid dopplers 6. Prophylactic therapy-Continue ASA 7. NPO until RN stroke swallow screen 8. Telemetry monitoring 9. Frequent neuro checks 10. BP control   Alexis Goodell, MD Neurology [phone] 10/19/2015, 12:34 PM

## 2015-10-20 ENCOUNTER — Observation Stay: Payer: Medicare Other

## 2015-10-20 DIAGNOSIS — I610 Nontraumatic intracerebral hemorrhage in hemisphere, subcortical: Secondary | ICD-10-CM | POA: Diagnosis not present

## 2015-10-20 DIAGNOSIS — R55 Syncope and collapse: Secondary | ICD-10-CM | POA: Diagnosis not present

## 2015-10-20 LAB — GLUCOSE, CAPILLARY
GLUCOSE-CAPILLARY: 82 mg/dL (ref 65–99)
GLUCOSE-CAPILLARY: 108 mg/dL — AB (ref 65–99)

## 2015-10-20 LAB — BASIC METABOLIC PANEL
ANION GAP: 5 (ref 5–15)
BUN: 17 mg/dL (ref 6–20)
CALCIUM: 8.1 mg/dL — AB (ref 8.9–10.3)
CHLORIDE: 107 mmol/L (ref 101–111)
CO2: 25 mmol/L (ref 22–32)
CREATININE: 0.83 mg/dL (ref 0.44–1.00)
GFR calc non Af Amer: >60 mL/min (ref >60)
Glucose, Bld: 87 mg/dL (ref 65–99)
Potassium: 3.7 mmol/L (ref 3.5–5.1)
SODIUM: 137 mmol/L (ref 135–145)

## 2015-10-20 LAB — CBC
HCT: 30.3 % — ABNORMAL LOW (ref 35.0–47.0)
HEMOGLOBIN: 10.2 g/dL — AB (ref 12.0–16.0)
MCH: 29.7 pg (ref 26.0–34.0)
MCHC: 33.6 g/dL (ref 32.0–36.0)
MCV: 88.3 fL (ref 80.0–100.0)
PLATELETS: 84 10*3/uL — AB (ref 150–440)
RBC: 3.43 MIL/uL — AB (ref 3.80–5.20)
RDW: 16.6 % — ABNORMAL HIGH (ref 11.5–14.5)
WBC: 7 10*3/uL (ref 3.6–11.0)

## 2015-10-20 LAB — HEMOGLOBIN A1C
HEMOGLOBIN A1C: 5.4 % (ref 4.8–5.6)
Mean Plasma Glucose: 108 mg/dL

## 2015-10-20 MED ORDER — ACETAMINOPHEN 325 MG PO TABS: 650.0000 mg | ORAL_TABLET | Freq: Four times a day (QID) | ORAL | Status: DC | PRN

## 2015-10-20 MED ORDER — FERROUS SULFATE 325 (65 FE) MG PO TABS: 325.0000 mg | ORAL_TABLET | Freq: Two times a day (BID) | ORAL | Status: DC

## 2015-10-20 MED ORDER — LISINOPRIL 5 MG PO TABS: 5.0000 mg | ORAL_TABLET | Freq: Every day | ORAL | 0 refills | Status: DC

## 2015-10-20 MED ORDER — LISINOPRIL 10 MG PO TABS: 10.0000 mg | ORAL_TABLET | Freq: Every day | ORAL | 0 refills | Status: DC

## 2015-10-20 MED ORDER — GLUCERNA SHAKE PO LIQD: 237.0000 mL | Freq: Three times a day (TID) | ORAL | 0 refills | Status: DC

## 2015-10-20 MED ORDER — DOCUSATE SODIUM 100 MG PO CAPS: 100.0000 mg | ORAL_CAPSULE | Freq: Two times a day (BID) | ORAL | 0 refills | Status: DC

## 2015-10-20 MED ORDER — FERROUS SULFATE 325 (65 FE) MG PO TABS: 325.0000 mg | ORAL_TABLET | Freq: Two times a day (BID) | ORAL | 3 refills | Status: DC

## 2015-10-20 NOTE — Discharge Summary (Signed)
[facility] NAME: Ariana Miller    MR#:  [id number]  DATE OF BIRTH:  Apr 27, 1923  DATE OF ADMISSION:  10/18/2015 ADMITTING PHYSICIAN: Harvie Bridge, DO  DATE OF DISCHARGE: 10/20/2015 PRIMARY CARE PHYSICIAN: Rusty Aus, MD    ADMISSION DIAGNOSIS:  Dizziness [R42] Hemorrhagic stroke ([place]) [I61.9] Hematuria, unspecified type [R31.9]  DISCHARGE DIAGNOSIS:  Dizziness secondary to intracranial hemorrhage punctate Chronic hematuria SECONDARY DIAGNOSIS:   Past Medical History:  Diagnosis Date  . Asthma   . CAD (coronary artery disease)   . Diabetes mellitus without complication ([place])   . HTN (hypertension)     HOSPITAL COURSE:   This is a 82 y.o.femalewith a history of Hypertension, diabetes, coronary artery disease and asthma, recent hematuriaAdmitted with dizziness. Please review history and physical for details  1. Dizziness/ near syncope Head CT concerning for punctate intracranial hemorrhage  repeat CT brain per recommendation of radiolog, with no significant changes.  Neuro exam is nonfocal so far Appreciate neurology recommendations given symptoms of weakness and dizziness. Recommended baby aspirin for prophylaxis but not considering as the patient has hematuria and hemoglobin is slowly trending down PT, OT and speech therapy consulted, recommended home health PT Carotid Dopplers with less than 50% internal carotid stenosis. Status post left carotid endarterectomy Echocardiogram left ventricular ejection fraction 55-60% technically difficult study  2. Hyponatremia and hypokalemia with weakness-gentle IV fluid hydration and resolved. Sodium 137 and potassium 3.7 today  3. Hematuria with soft tissue mass of the bladder-patient has outpatient urology follow-up established. Follow-up appointment is on October 30 according to the daughter.  monitor CBC closely. Today's hemoglobin is at 10.2 trended down  from 11.2 Will start the patient on iron tablets. Discussed with the daughter she is aware.  4. Anemia with normocytic cytopenia, likely secondary to #3-monitor CBC and transfuse as needed.  5. History of diabetes-Accu-Cheks at before meals and at bedtime with insulin ss coverage, oral hypoglycemics discontinued as the patients sugars are running low  6.History of hypertension-discontinue Zestoretic as the patient is hypotensive  started the patient on low-dose lisinopril 5  mg once daily  7.History of dementia-continue Aricept  Plan is to arrange home health PT as recommended by physical therapy  Patient is DO NOT RESUSCITATE  DISCHARGE CONDITIONS:  fair  CONSULTS OBTAINED:  Treatment Team:  Catarina Hartshorn, MD Alexis Goodell, MD   PROCEDURES none  DRUG ALLERGIES:  No Known Allergies  DISCHARGE MEDICATIONS:   Current Discharge Medication List    START taking these medications   Details  acetaminophen (TYLENOL) 325 MG tablet Take 2 tablets (650 mg total) by mouth every 6 (six) hours as needed for mild pain (or Fever >/= 101).    docusate sodium (COLACE) 100 MG capsule Take 1 capsule (100 mg total) by mouth 2 (two) times daily. Qty: 10 capsule, Refills: 0    ferrous sulfate 325 (65 FE) MG tablet Take 1 tablet (325 mg total) by mouth 2 (two) times daily with a meal. Refills: 3    lisinopril (PRINIVIL,ZESTRIL) 5 MG tablet Take 1 tablet (5 mg total) by mouth daily. Qty: 30 tablet, Refills: 0      CONTINUE these medications which have CHANGED   Details  feeding supplement, GLUCERNA SHAKE, (GLUCERNA SHAKE) LIQD Take 237 mLs by mouth 3 (three) times daily with meals. Qty: 90 Can, Refills: 0      CONTINUE these medications which have NOT CHANGED   Details  donepezil (ARICEPT)  10 MG tablet Take 10 mg by mouth at bedtime.      STOP taking these medications     aspirin 81 MG chewable tablet      glimepiride (AMARYL) 1 MG tablet       lisinopril-hydrochlorothiazide (PRINZIDE,ZESTORETIC) 10-12.5 MG tablet          DISCHARGE INSTRUCTIONS:   Activity as tolerated per home health PT recommendations Follow-up with primary care physician in 3-5 days Diabetic diet Follow-up with urologist as scheduled on October 30  DIET:  Diabetic diet  DISCHARGE CONDITION:  Fair  ACTIVITY:  Activity as tolerated  OXYGEN:  Home Oxygen: No.   Oxygen Delivery: room air  DISCHARGE LOCATION:  home   If you experience worsening of your admission symptoms, develop shortness of breath, life threatening emergency, suicidal or homicidal thoughts you must seek medical attention immediately by calling 911 or calling your MD immediately  if symptoms less severe.  You Must read complete instructions/literature along with all the possible adverse reactions/side effects for all the Medicines you take and that have been prescribed to you. Take any new Medicines after you have completely understood and accpet all the possible adverse reactions/side effects.   Please note  You were cared for by a hospitalist during your hospital stay. If you have any questions about your discharge medications or the care you received while you were in the hospital after you are discharged, you can call the unit and asked to speak with the hospitalist on call if the hospitalist that took care of you is not available. Once you are discharged, your primary care physician will handle any further medical issues. Please note that NO REFILLS for any discharge medications will be authorized once you are discharged, as it is imperative that you return to your primary care physician (or establish a relationship with a primary care physician if you do not have one) for your aftercare needs so that they can reassess your need for medications and monitor your lab values.     Today  Chief Complaint  Patient presents with  . Dizziness  . Weakness   Patient is resting  comfortably. Denies any dizziness or headache or blurry vision. Wants to go home. Daughter at bedside. Agreeable with the plan. Continues to have chronic hematuria from bladder tumor, patient has been following up with urologist and has an appointment on October 30.  ROS:  CONSTITUTIONAL: Denies fevers, chills. Denies any fatigue, weakness.  EYES: Denies blurry vision, double vision, eye pain. EARS, NOSE, THROAT: Denies tinnitus, ear pain, hearing loss. RESPIRATORY: Denies cough, wheeze, shortness of breath.  CARDIOVASCULAR: Denies chest pain, palpitations, edema.  GASTROINTESTINAL: Denies nausea, vomiting, diarrhea, abdominal pain. Denies bright red blood per rectum. GENITOURINARY: Denies dysuria, hematuria. ENDOCRINE: Denies nocturia or thyroid problems. HEMATOLOGIC AND LYMPHATIC: Denies easy bruising or bleeding. SKIN: Denies rash or lesion. MUSCULOSKELETAL: Denies pain in neck, back, shoulder, knees, hips or arthritic symptoms.  NEUROLOGIC: Denies paralysis, paresthesias.  PSYCHIATRIC: Denies anxiety or depressive symptoms.   VITAL SIGNS:  Blood pressure (!) 138/31, pulse 64, temperature 98 F (36.7 C), temperature source Oral, resp. rate 20, height 5' (1.524 m), weight 69.9 kg (154 lb 3.2 oz), SpO2 97 %.  I/O:    Intake/Output Summary (Last 24 hours) at 10/20/15 1301 Last data filed at 10/20/15 0800  Gross per 24 hour  Intake             1730 ml  Output  550 ml  Net             1180 ml    PHYSICAL EXAMINATION:  GENERAL:  82 y.o.-year-old patient lying in the bed with no acute distress.  EYES: Pupils equal, round, reactive to light and accommodation. No scleral icterus. Extraocular muscles intact.  HEENT: Head atraumatic, normocephalic. Oropharynx and nasopharynx clear.  NECK:  Supple, no jugular venous distention. No thyroid enlargement, no tenderness.  LUNGS: Normal breath sounds bilaterally, no wheezing, rales,rhonchi or crepitation. No use of accessory  muscles of respiration.  CARDIOVASCULAR: S1, S2 normal. No murmurs, rubs, or gallops.  ABDOMEN: Soft, non-tender, non-distended. Bowel sounds present. No organomegaly or mass.  EXTREMITIES: No pedal edema, cyanosis, or clubbing.  NEUROLOGIC: Cranial nerves II through XII are intact. Muscle strength 5/5 in all extremities. Sensation intact. Gait not checked.  PSYCHIATRIC: The patient is alert and oriented x 3.  SKIN: No obvious rash, lesion, or ulcer.   DATA REVIEW:   CBC  Recent Labs Lab 10/20/15 0517  WBC 7.0  HGB 10.2*  HCT 30.3*  PLT 84*    Chemistries   Recent Labs Lab 10/19/15 1044 10/20/15 0517  NA 138 137  K 3.2* 3.7  CL 105 107  CO2 27 25  GLUCOSE 86 87  BUN 18 17  CREATININE 0.77 0.83  CALCIUM 8.7* 8.1*  MG 1.9  --     Cardiac Enzymes  Recent Labs Lab 10/18/15 2154  TROPONINI <0.03    Microbiology Results  No results found for this or any previous visit.  RADIOLOGY:  Ct Head Wo Contrast  Result Date: 10/20/2015 CLINICAL DATA:  Follow-up examination from of on recent CT EXAM: CT HEAD WITHOUT CONTRAST TECHNIQUE: Contiguous axial images were obtained from the base of the skull through the vertex without intravenous contrast. COMPARISON:  10/19/2015 FINDINGS: Brain: Straight able chronic white matter ischemic change in atrophic change is noted. The previously seen hyperdensity within the deep white matter on the left is again identified and stable in appearance. No new focal area of increased attenuation is noted. No new acute hemorrhage is seen. Vascular: No hyperdense vessel or unexpected calcification. Skull: Normal. Negative for fracture or focal lesion. Sinuses/Orbits: No acute finding. Other: None. IMPRESSION: Stable small hyperdensity within the posterior left frontal lobe no new focal abnormality is seen. Stable atrophic and chronic white matter ischemic changes. Electronically Signed   By: Inez Catalina M.D.   On: 10/20/2015 11:31   Ct Head Wo  Contrast  Result Date: 10/19/2015 CLINICAL DATA:  82 y/o F; dizziness, hematuria, and weakness starting today. EXAM: CT HEAD WITHOUT CONTRAST TECHNIQUE: Contiguous axial images were obtained from the base of the skull through the vertex without intravenous contrast. COMPARISON:  06/08/2008 CT head FINDINGS: Brain: 3 mm hyperdense focus in left posterior frontal white matter may present on prior CT may represent a parenchymal hemorrhage. No associated edema. No evidence for large territory infarct. Stable mild chronic microvascular ischemic changes and parenchymal volume loss. Vascular: No hyperdense vessel. Extensive calcific atherosclerosis of carotid siphons and vertebral arteries. Skull: Normal. Negative for fracture or focal lesion. Sinuses/Orbits: No acute finding. Other: None. IMPRESSION: Small hyperdense focus in left posterior frontal white matter may represent a acute parenchymal hemorrhage. Follow-up short interval head CT is recommended to evaluate for stability. These results were called by telephone at the time of interpretation on 10/19/2015 at 2:53 am to Dr. Charlesetta Ivory , who verbally acknowledged these results. Electronically Signed   By: Mia Creek  [place]Stratton M.D.   On: 10/19/2015 02:53   US Carotid Bilateral  Result Date: 10/19/2015 CLINICAL DATA:  Stroke. EXAM: BILATERAL CAROTID DUPLEX ULTRASOUND TECHNIQUE: Pearline Cables scale imaging, color Doppler and duplex ultrasound were performed of bilateral carotid and vertebral arteries in the neck. COMPARISON:  CTA neck 09/13/2006 FINDINGS: Criteria: Quantification of carotid stenosis is based on velocity parameters that correlate the residual internal carotid diameter with NASCET-based stenosis levels, using the diameter of the distal internal carotid lumen as the denominator for stenosis measurement. The following velocity measurements were obtained: RIGHT ICA:  88 cm/sec CCA:  92 cm/sec SYSTOLIC ICA/CCA RATIO:  1 DIASTOLIC ICA/CCA RATIO:  1.2  ECA:  80 cm/sec LEFT ICA:  110 cm/sec CCA:  123456 cm/sec SYSTOLIC ICA/CCA RATIO:  0.9 DIASTOLIC ICA/CCA RATIO:  1.0 ECA:  80 cm/sec RIGHT CAROTID ARTERY: Small amount of echogenic plaque at the right carotid bulb. Shadowing at the origin of the internal carotid artery due to calcifications. Limited visualization of the proximal internal carotid artery due to the calcifications. Normal waveforms and velocities in the mid and distal right internal carotid artery. RIGHT VERTEBRAL ARTERY: Antegrade flow and normal waveform in the right vertebral artery. LEFT CAROTID ARTERY: Previously, the patient had a large amount of calcified plaque at the left carotid bulb. The left carotid bulb and proximal internal carotid artery are now widely patent and suspect an endarterectomy. Normal waveforms and velocities throughout the left internal carotid artery. LEFT VERTEBRAL ARTERY: Antegrade flow and normal waveform in the left vertebral artery. IMPRESSION: Mild atherosclerotic disease in the carotid arteries. Estimated degree of stenosis in the internal carotid arteries is less than 50% bilaterally. Evidence for left carotid endarterectomy. Patent vertebral arteries. Electronically Signed   By: Markus Daft M.D.   On: 10/19/2015 15:12   Ct Renal Stone Study  Result Date: 10/19/2015 CLINICAL DATA:  82 year old female with hematuria EXAM: CT ABDOMEN AND PELVIS WITHOUT CONTRAST TECHNIQUE: Multidetector CT imaging of the abdomen and pelvis was performed following the standard protocol without IV contrast. COMPARISON:  CT dated 11/04/2009 FINDINGS: Evaluation of this exam is limited in the absence of intravenous contrast. Lower chest: Emphysematous changes of the lung bases with interstitial coarsening. There is heavy calcification of the mitral annulus as well as coronary vascular calcification. No intra-abdominal free air or free fluid. Hepatobiliary: Cholecystectomy. There is irregularity of the hepatic contour concerning for  underlying cirrhosis. Clinical correlation is recommended. No intrahepatic biliary ductal dilatation. Pancreas: Unremarkable. No pancreatic ductal dilatation or surrounding inflammatory changes. Spleen: Normal in size without focal abnormality. Adrenals/Urinary Tract: There is no hydronephrosis or nephrolithiasis on either side. The visualized ureters appear grossly unremarkable. There is a 1.2 x 1.5 cm nodular soft tissue lesion arising from the dome of the bladder adjacent to the right UVJ (series 2, image 60) concerning for neoplasm. Further evaluation with cystoscopy recommended. Stomach/Bowel: Constipation. Small scattered colonic diverticula. There is no evidence of bowel obstruction or active inflammation. The appendix is not visualized with certainty. No inflammatory changes identified in the right lower quadrant. Vascular/Lymphatic: There is advanced aortoiliac atherosclerotic disease. The aorta is ectatic and measures up to 2.2 cm in diameter. No portal venous gas identified. Evaluation of the vasculature is limited in the absence of intravenous contrast. Reproductive: The uterus and the ovaries are grossly unremarkable. Other: None Musculoskeletal: Advanced osteopenia with degenerative changes of the spine and hips. No definite acute fracture. IMPRESSION: Nodular soft tissue lesion arising from the dome of the bladder adjacent to the right UVJ.  Further evaluation with cystoscopy recommended. No hydronephrosis or nephrolithiasis. Constipation. No evidence of bowel obstruction or active inflammation. Electronically Signed   By: Anner Crete M.D.   On: 10/19/2015 02:56    EKG:   Orders placed or performed during the hospital encounter of 10/18/15  . EKG 12-Lead  . EKG 12-Lead  . ED EKG  . ED EKG      Management plans discussed with the patient, family and they are in agreement.  CODE STATUS:     Code Status Orders        Start     Ordered   10/19/15 1122  Do not attempt  resuscitation (DNR)  Continuous    Question Answer Comment  In the event of cardiac or respiratory ARREST Do not call a "code blue"   In the event of cardiac or respiratory ARREST Do not perform Intubation, CPR, defibrillation or ACLS   In the event of cardiac or respiratory ARREST Use medication by any route, position, wound care, and other measures to relive pain and suffering. May use oxygen, suction and manual treatment of airway obstruction as needed for comfort.   Comments RN may pronounce      10/19/15 1121    Code Status History    Date Active Date Inactive Code Status Order ID Comments User Context   10/19/2015 10:14 AM 10/19/2015 11:21 AM Full Code [id number]  Harvie Bridge, DO Inpatient   03/03/2015  1:27 AM 03/05/2015  5:02 PM Full Code [id number]  Lance Coon, MD Inpatient      TOTAL TIME TAKING CARE OF THIS PATIENT: 45  minutes.   Note: This dictation was prepared with Dragon dictation along with smaller phrase technology. Any transcriptional errors that result from this process are unintentional.   @[facility] @  on 10/20/2015 at 1:01 PM  Between 7am to 6pm - Pager - [phone]  After 6pm go to [email or web address] - password [email or web address] [facility]  Office  [phone]  CC: Primary care physician; Rusty Aus, MD

## 2015-10-20 NOTE — Evaluation (Signed)
Physical Therapy Evaluation Patient Details Name: Ariana Miller MRN: [id number] DOB: [birth year] Today's Date: 10/20/2015   History of Present Illness  Pt admitted for complaints of dizziness/weakness. Pt with recent hematuria and now with noted mass. Pt with history of HTN, DM, CAD, and asthma. New imagine noted acute parenchymal hemmorrhage. Repeat CT done this AM, no results at this time.   Clinical Impression  Pt is a pleasant 82 year old female who was admitted for weakness. Discussed with neuro MD, prior to entering room and cleared for participation with PT as hemorrhage is very small and will not require intervention. Negative for blurry vision/headache. Pt complains of dizziness with ambulation, BP at 92/54, further ambulation deferred. Normal finger to nose and sensation in tact. Pt performs bed mobility, transfers, and ambulation with cga and RW. Pt primarily household ambulator and uses SPC in home. Secondary to balance deficits and dizziness, recommend use of RW at all times at home. Pt demonstrates deficits with strength/balance/endurance. Spanish interpreter present during evaluation. Would benefit from skilled PT to address above deficits and promote optimal return to PLOF. Recommend transition to [facility] upon discharge from acute hospitalization.       Follow Up Recommendations Home health PT;Supervision/Assistance - 24 hour    Equipment Recommendations  None recommended by PT    Recommendations for Other Services       Precautions / Restrictions Precautions Precautions: Fall Restrictions Weight Bearing Restrictions: No      Mobility  Bed Mobility Overal bed mobility: Needs Assistance Bed Mobility: Supine to Sit     Supine to sit: Min guard     General bed mobility comments: assist for sliding out towards EOB. Pt able to initate transfer and sliding B LEs off bed. Secondary to previous shoulder injury, easier to get out of bed on R side of bed. Once sitting at  EOB, able to sit with supervision  Transfers Overall transfer level: Needs assistance Equipment used: Rolling walker (2 wheeled) Transfers: Sit to/from Stand Sit to Stand: Min guard         General transfer comment: safe technique performed with transfers with cues to push from seated surface. No LOB noted  Ambulation/Gait Ambulation/Gait assistance: Min guard Ambulation Distance (Feet): 50 Feet Assistive device: Rolling walker (2 wheeled) Gait Pattern/deviations: Step-through pattern     General Gait Details: slow gait speed noted, with increased dizziness noted with progressive ambulation. Low BP noted prior to ambulation therefore further distance deferred. No LOB noted.  Stairs            Wheelchair Mobility    Modified Rankin (Stroke Patients Only)       Balance Overall balance assessment: Needs assistance Sitting-balance support: Feet supported Sitting balance-Leahy Scale: Normal     Standing balance support: Bilateral upper extremity supported Standing balance-Leahy Scale: Good                               Pertinent Vitals/Pain Pain Assessment: No/denies pain    Home Living Family/patient expects to be discharged to:: Private residence Living Arrangements: Children Available Help at Discharge: Family;Available 24 hours/day Type of Home: House Home Access: Stairs to enter Entrance Stairs-Rails: None Entrance Stairs-Number of Steps: 1 Home Layout: One level Home Equipment: Cane - single point;Walker - 2 wheels      Prior Function Level of Independence: Independent with assistive device(s) (uses SPC at baseline)         Comments:  Patient has progressively declined with ambulation, she has been using a [facility] for very limited home mobility.      Hand Dominance        Extremity/Trunk Assessment   Upper Extremity Assessment: Generalized weakness (grossly 4/5)           Lower Extremity Assessment: Generalized weakness  (grossly 4/5)         Communication   Communication: Prefers language other than [place];Interpreter utilized  Cognition Arousal/Alertness: Awake/alert Behavior During Therapy: WFL for tasks assessed/performed Overall Cognitive Status: History of cognitive impairments - at baseline                      General Comments      Exercises Other Exercises Other Exercises: supine ther-ex performed including B LE ankle pumps, SAQ, SLRs, hip add squeezes, and resisted heel slides. ALl ther-ex performed x 10 reps with cga and cues for correct technique.   Assessment/Plan    PT Assessment Patient needs continued PT services  PT Problem List Decreased strength;Decreased activity tolerance;Decreased balance;Decreased mobility;Decreased safety awareness          PT Treatment Interventions Gait training;DME instruction;Therapeutic activities;Therapeutic exercise    PT Goals (Current goals can be found in the Care Plan section)  Acute Rehab PT Goals Patient Stated Goal: to get stronger PT Goal Formulation: With patient Time For Goal Achievement: 11/03/15 Potential to Achieve Goals: Good    Frequency Min 2X/week   Barriers to discharge        Co-evaluation               End of Session Equipment Utilized During Treatment: Gait belt Activity Tolerance: Patient tolerated treatment well Patient left: in bed;with bed alarm set;with family/visitor present (refused chair at this time) Nurse Communication: Mobility status    Functional Assessment Tool Used: clinical judgement Functional Limitation: Mobility: Walking and moving around Mobility: Walking and Moving Around Current Status [id number]): At least 1 percent but less than 20 percent impaired, limited or restricted Mobility: Walking and Moving Around Goal Status [phone]): 0 percent impaired, limited or restricted    Time: [id number] PT Time Calculation (min) (ACUTE ONLY): 23 min   Charges:   PT Evaluation $PT Eval  Moderate Complexity: 1 Procedure PT Treatments $Therapeutic Exercise: 8-22 mins   PT G Codes:   PT G-Codes **NOT FOR INPATIENT CLASS** Functional Assessment Tool Used: clinical judgement Functional Limitation: Mobility: Walking and moving around Mobility: Walking and Moving Around Current Status [id number]): At least 1 percent but less than 20 percent impaired, limited or restricted Mobility: Walking and Moving Around Goal Status [phone]): 0 percent impaired, limited or restricted    Neko Mcgeehan 10/20/2015, 11:38 AM Greggory Stallion, PT, DPT [phone]

## 2015-10-20 NOTE — Progress Notes (Signed)
Subjective: Patient awake and alert.  At baseline per daughter.    Objective: Current vital signs: BP (!) 138/31 (BP Location: Right Arm)   Pulse 64   Temp 98 F (36.7 C) (Oral)   Resp 20   Ht 5' (1.524 m)   Wt 69.9 kg (154 lb 3.2 oz)   SpO2 97%   BMI 30.12 kg/m  Vital signs in last 24 hours: Temp:  [97.7 F (36.5 C)-98.6 F (37 C)] 98 F (36.7 C) (10/17 0439) Pulse Rate:  [60-67] 64 (10/17 0915) Resp:  [18-20] 20 (10/17 0439) BP: (92-159)/(30-54) 138/31 (10/17 0915) SpO2:  [97 %-100 %] 97 % (10/17 0439)  Intake/Output from previous day: 10/16 0701 - 10/17 0700 In: 1490 [P.O.:240; I.V.:1250] Out: 950 [Urine:950] Intake/Output this shift: Total I/O In: 240 [P.O.:240] Out: -  Nutritional status: Diet heart healthy/carb modified Room service appropriate? Yes; Fluid consistency: Thin  Neurologic Exam: Mental Status: Alert, responds appropriately to questions in Spanish.  Speech fluent without evidence of aphasia.  Able to follow simple commands. Cranial Nerves: II: Discs flat bilaterally; Visual fields grossly normal, pupils equal, round, reactive to light and accommodation III,IV, VI: ptosis not present, extra-ocular motions intact bilaterally V,VII: smile symmetric, facial light touch sensation normal bilaterally VIII: hearing normal bilaterally IX,X: gag reflex present XI: bilateral shoulder shrug XII: midline tongue extension Motor: Generalized weakness with no focality noted other than as related to decreased ROM in the LUE.     Lab Results: Basic Metabolic Panel:  Recent Labs Lab 10/18/15 2154 10/19/15 1044 10/20/15 0517  NA 130* 138 137  K 3.5 3.2* 3.7  CL 96* 105 107  CO2 29 27 25   GLUCOSE 99 86 87  BUN 22* 18 17  CREATININE 0.89 0.77 0.83  CALCIUM 8.4* 8.7* 8.1*  MG  --  1.9  --   PHOS  --  3.3  --     Liver Function Tests: No results for input(s): AST, ALT, ALKPHOS, BILITOT, PROT, ALBUMIN in the last 168 hours. No results for input(s):  LIPASE, AMYLASE in the last 168 hours. No results for input(s): AMMONIA in the last 168 hours.  CBC:  Recent Labs Lab 10/18/15 2154 10/19/15 1044 10/20/15 0517  WBC 8.2 7.5 7.0  HGB 11.2* 11.6* 10.2*  HCT 34.1* 34.1* 30.3*  MCV 90.4 88.2 88.3  PLT 94* 94* 84*    Cardiac Enzymes:  Recent Labs Lab 10/18/15 2154  TROPONINI <0.03    Lipid Panel:  Recent Labs Lab 10/19/15 1044  CHOL 139  TRIG 62  HDL 48  CHOLHDL 2.9  VLDL 12  LDLCALC 79    CBG:  Recent Labs Lab 10/19/15 1142 10/19/15 1726 10/19/15 2136 10/19/15 2333 10/20/15 0746  GLUCAP 85 106* 75 76 82    Microbiology: No results found for this or any previous visit.  Coagulation Studies: No results for input(s): LABPROT, INR in the last 72 hours.  Imaging: Ct Head Wo Contrast  Result Date: 10/20/2015 CLINICAL DATA:  Follow-up examination from of on recent CT EXAM: CT HEAD WITHOUT CONTRAST TECHNIQUE: Contiguous axial images were obtained from the base of the skull through the vertex without intravenous contrast. COMPARISON:  10/19/2015 FINDINGS: Brain: Straight able chronic white matter ischemic change in atrophic change is noted. The previously seen hyperdensity within the deep white matter on the left is again identified and stable in appearance. No new focal area of increased attenuation is noted. No new acute hemorrhage is seen. Vascular: No hyperdense vessel or unexpected  calcification. Skull: Normal. Negative for fracture or focal lesion. Sinuses/Orbits: No acute finding. Other: None. IMPRESSION: Stable small hyperdensity within the posterior left frontal lobe no new focal abnormality is seen. Stable atrophic and chronic white matter ischemic changes. Electronically Signed   By: Inez Catalina M.D.   On: 10/20/2015 11:31   Ct Head Wo Contrast  Result Date: 10/19/2015 CLINICAL DATA:  82 y/o F; dizziness, hematuria, and weakness starting today. EXAM: CT HEAD WITHOUT CONTRAST TECHNIQUE: Contiguous axial  images were obtained from the base of the skull through the vertex without intravenous contrast. COMPARISON:  06/08/2008 CT head FINDINGS: Brain: 3 mm hyperdense focus in left posterior frontal white matter may present on prior CT may represent a parenchymal hemorrhage. No associated edema. No evidence for large territory infarct. Stable mild chronic microvascular ischemic changes and parenchymal volume loss. Vascular: No hyperdense vessel. Extensive calcific atherosclerosis of carotid siphons and vertebral arteries. Skull: Normal. Negative for fracture or focal lesion. Sinuses/Orbits: No acute finding. Other: None. IMPRESSION: Small hyperdense focus in left posterior frontal white matter may represent a acute parenchymal hemorrhage. Follow-up short interval head CT is recommended to evaluate for stability. These results were called by telephone at the time of interpretation on 10/19/2015 at 2:53 am to Dr. Charlesetta Ivory , who verbally acknowledged these results. Electronically Signed   By: Kristine Garbe M.D.   On: 10/19/2015 02:53   US Carotid Bilateral  Result Date: 10/19/2015 CLINICAL DATA:  Stroke. EXAM: BILATERAL CAROTID DUPLEX ULTRASOUND TECHNIQUE: Pearline Cables scale imaging, color Doppler and duplex ultrasound were performed of bilateral carotid and vertebral arteries in the neck. COMPARISON:  CTA neck 09/13/2006 FINDINGS: Criteria: Quantification of carotid stenosis is based on velocity parameters that correlate the residual internal carotid diameter with NASCET-based stenosis levels, using the diameter of the distal internal carotid lumen as the denominator for stenosis measurement. The following velocity measurements were obtained: RIGHT ICA:  88 cm/sec CCA:  92 cm/sec SYSTOLIC ICA/CCA RATIO:  1 DIASTOLIC ICA/CCA RATIO:  1.2 ECA:  80 cm/sec LEFT ICA:  110 cm/sec CCA:  123456 cm/sec SYSTOLIC ICA/CCA RATIO:  0.9 DIASTOLIC ICA/CCA RATIO:  1.0 ECA:  80 cm/sec RIGHT CAROTID ARTERY: Small amount of  echogenic plaque at the right carotid bulb. Shadowing at the origin of the internal carotid artery due to calcifications. Limited visualization of the proximal internal carotid artery due to the calcifications. Normal waveforms and velocities in the mid and distal right internal carotid artery. RIGHT VERTEBRAL ARTERY: Antegrade flow and normal waveform in the right vertebral artery. LEFT CAROTID ARTERY: Previously, the patient had a large amount of calcified plaque at the left carotid bulb. The left carotid bulb and proximal internal carotid artery are now widely patent and suspect an endarterectomy. Normal waveforms and velocities throughout the left internal carotid artery. LEFT VERTEBRAL ARTERY: Antegrade flow and normal waveform in the left vertebral artery. IMPRESSION: Mild atherosclerotic disease in the carotid arteries. Estimated degree of stenosis in the internal carotid arteries is less than 50% bilaterally. Evidence for left carotid endarterectomy. Patent vertebral arteries. Electronically Signed   By: Markus Daft M.D.   On: 10/19/2015 15:12   Ct Renal Stone Study  Result Date: 10/19/2015 CLINICAL DATA:  82 year old female with hematuria EXAM: CT ABDOMEN AND PELVIS WITHOUT CONTRAST TECHNIQUE: Multidetector CT imaging of the abdomen and pelvis was performed following the standard protocol without IV contrast. COMPARISON:  CT dated 11/04/2009 FINDINGS: Evaluation of this exam is limited in the absence of intravenous contrast. Lower chest: Emphysematous  changes of the lung bases with interstitial coarsening. There is heavy calcification of the mitral annulus as well as coronary vascular calcification. No intra-abdominal free air or free fluid. Hepatobiliary: Cholecystectomy. There is irregularity of the hepatic contour concerning for underlying cirrhosis. Clinical correlation is recommended. No intrahepatic biliary ductal dilatation. Pancreas: Unremarkable. No pancreatic ductal dilatation or surrounding  inflammatory changes. Spleen: Normal in size without focal abnormality. Adrenals/Urinary Tract: There is no hydronephrosis or nephrolithiasis on either side. The visualized ureters appear grossly unremarkable. There is a 1.2 x 1.5 cm nodular soft tissue lesion arising from the dome of the bladder adjacent to the right UVJ (series 2, image 60) concerning for neoplasm. Further evaluation with cystoscopy recommended. Stomach/Bowel: Constipation. Small scattered colonic diverticula. There is no evidence of bowel obstruction or active inflammation. The appendix is not visualized with certainty. No inflammatory changes identified in the right lower quadrant. Vascular/Lymphatic: There is advanced aortoiliac atherosclerotic disease. The aorta is ectatic and measures up to 2.2 cm in diameter. No portal venous gas identified. Evaluation of the vasculature is limited in the absence of intravenous contrast. Reproductive: The uterus and the ovaries are grossly unremarkable. Other: None Musculoskeletal: Advanced osteopenia with degenerative changes of the spine and hips. No definite acute fracture. IMPRESSION: Nodular soft tissue lesion arising from the dome of the bladder adjacent to the right UVJ. Further evaluation with cystoscopy recommended. No hydronephrosis or nephrolithiasis. Constipation. No evidence of bowel obstruction or active inflammation. Electronically Signed   By: Anner Crete M.D.   On: 10/19/2015 02:56    Medications:  I have reviewed the patient's current medications. Scheduled: . docusate sodium  100 mg Oral BID  . donepezil  10 mg Oral QHS  . feeding supplement (GLUCERNA SHAKE)  237 mL Oral TID WC  . lisinopril  10 mg Oral Daily   And  . hydrochlorothiazide  12.5 mg Oral Daily  . insulin aspart  0-15 Units Subcutaneous TID WC  . insulin aspart  0-5 Units Subcutaneous QHS  . sodium chloride flush  3 mL Intravenous Q12H    Assessment/Plan: Patient stable.  Repeat head CT personally  reviewed ans shows no progression of hemorrhage.  LDL 79.  A1c pending.  EF 55-60% on echocardiogram.  Carotid dopplers show no evidence of hemodynamically significant stenosis.  BP controlled.  Recommendations: 1.  Continue ASA 2.  Follow up with neurology on an outpatient basis.      LOS: 0 days   Alexis Goodell, MD Neurology [phone] 10/20/2015  11:37 AM

## 2015-10-20 NOTE — Care Management (Signed)
Admitted to [facility] with the diagnosis of weakness under observation status. Lives with daughter, Ariana Miller [phone]). Seen Dr. Emily Filbert last month. Cane and rolling walker in the home. [facility] last 03/05/15. No skilled facility. No home oxygen. Self feeds, self dresses, but needs help in and out of shower.Prescriptions are filled at [facility] in [place]. No falls. Fair appetite. Lost 10-15 lbs in the last year.  Physical therapy evaluation completed. Recommends home with home health and therapy in the home. Would like [facility] again. Floydene Flock, [facility] representative updated. Shelbie Ammons RN MSN CCM Care Management [phone]

## 2015-10-20 NOTE — Progress Notes (Signed)
Patient discharged home per MD order. [place] education completed, all questions answered. Prescriptions given to patient and daughter. All discharge instructions given and all questions answered.

## 2015-10-20 NOTE — Discharge Instructions (Signed)
Activity as tolerated per home health PT recommendations Follow-up with primary care physician in 3-5 days Diabetic diet Follow-up with urologist as scheduled on October 30

## 2015-10-21 LAB — HEMOGLOBIN A1C
HEMOGLOBIN A1C: 5.3 % (ref 4.8–5.6)
Mean Plasma Glucose: 105 mg/dL

## 2015-10-26 ENCOUNTER — Other Ambulatory Visit: Payer: Self-pay

## 2015-10-26 DIAGNOSIS — R3129 Other microscopic hematuria: Secondary | ICD-10-CM

## 2015-10-27 DIAGNOSIS — I091 Rheumatic diseases of endocardium, valve unspecified: Secondary | ICD-10-CM | POA: Insufficient documentation

## 2015-10-30 ENCOUNTER — Ambulatory Visit (INDEPENDENT_AMBULATORY_CARE_PROVIDER_SITE_OTHER): Payer: Medicare Other | Admitting: Urology

## 2015-10-30 ENCOUNTER — Encounter: Payer: Self-pay | Admitting: Urology

## 2015-10-30 ENCOUNTER — Other Ambulatory Visit
Admission: RE | Admit: 2015-10-30 | Discharge: 2015-10-30 | Disposition: A | Discharge status: Home / Self Care | Payer: Medicare Other | Source: Ambulatory Visit | Attending: Urology | Admitting: Urology

## 2015-10-30 VITALS — BP 141/62 | HR 66 | Ht 60.0 in | Wt 145.0 lb

## 2015-10-30 DIAGNOSIS — R310 Gross hematuria: Secondary | ICD-10-CM | POA: Diagnosis not present

## 2015-10-30 DIAGNOSIS — I10 Essential (primary) hypertension: Secondary | ICD-10-CM | POA: Insufficient documentation

## 2015-10-30 DIAGNOSIS — I639 Cerebral infarction, unspecified: Secondary | ICD-10-CM

## 2015-10-30 DIAGNOSIS — R3129 Other microscopic hematuria: Secondary | ICD-10-CM

## 2015-10-30 DIAGNOSIS — N3289 Other specified disorders of bladder: Secondary | ICD-10-CM | POA: Diagnosis not present

## 2015-10-30 NOTE — Progress Notes (Signed)
10/30/2015 3:49 PM   Ariana Miller 09/27/1923 [id number]  Referring provider: Rusty Aus, MD [facility] [place], [place] [address]  Chief Complaint  Patient presents with  . Hematuria    New Patient    HPI: 82 year old female, a by her daughter today for further workup of a bladder mass and gross hematuria.  She was discharged from the hospital on 10/20/2015 after being admitted for dizziness and hemorrhagic stroke (punctate intracranial hemorrhage on CT scan).  In addition, during that admission she was noted to have gross hematuria.  Her hematuria has been somewhat chronic ranging from pink to red without clots for quite some time, (weeks -months). She denies any dysuria or any other urinary symptoms other than incontinence.  Noncontrast CT scan during that admission showed a 1.2 cm x 1.5 cm nodular soft tissue mass arising from what appeared to be the right dome of the bladder.  She continues to have intermittent gross hematuria without clots.  Former smoker, quit 40 years ago, 1 ppd x 40 years.    She is currently recommended to be on aspirin for history of stroke and coronary artery disease but has been holding in setting of gross hematuria.  She is accompanied by her daughter today.   PMH: Past Medical History:  Diagnosis Date  . Asthma   . CAD (coronary artery disease)   . Diabetes mellitus without complication ([place])   . HTN (hypertension)     Surgical History: Past Surgical History:  Procedure Laterality Date  . AORTIC VALVE REPLACEMENT (AVR)/CORONARY ARTERY BYPASS GRAFTING (CABG)    . CAROTID ENDARTERECTOMY    . CHOLECYSTECTOMY      Home Medications:    Medication List       Accurate as of 10/30/15 11:59 PM. Always use your most recent med list.          acetaminophen 325 MG tablet Commonly known as:  TYLENOL Take 2 tablets (650 mg total) by mouth every 6 (six) hours as needed for mild pain (or  Fever >/= 101).   docusate sodium 100 MG capsule Commonly known as:  COLACE Take 1 capsule (100 mg total) by mouth 2 (two) times daily.   donepezil 10 MG tablet Commonly known as:  ARICEPT Take 10 mg by mouth at bedtime.   feeding supplement (GLUCERNA SHAKE) Liqd Take 237 mLs by mouth 3 (three) times daily with meals.   ferrous sulfate 325 (65 FE) MG tablet Take 1 tablet (325 mg total) by mouth 2 (two) times daily with a meal.   lisinopril 5 MG tablet Commonly known as:  ZESTRIL Take 1 tablet (5 mg total) by mouth daily.       Allergies: No Known Allergies  Family History: Family History  Problem Relation Age of Onset  . Hepatitis Mother   . Liver disease Father   . Asthma    . Osteoporosis    . Hypertension    . Bladder Cancer Neg Hx   . Kidney cancer Neg Hx     Social History:  reports that she has quit smoking. She has never used smokeless tobacco. She reports that she does not drink alcohol or use drugs.  ROS: UROLOGY Frequent Urination?: Yes Hard to postpone urination?: Yes Burning/pain with urination?: No Get up at night to urinate?: Yes Leakage of urine?: Yes Urine stream starts and stops?: No Trouble starting stream?: No Do you have to strain to urinate?: No Blood in urine?: Yes  Urinary tract infection?: No Sexually transmitted disease?: No Injury to kidneys or bladder?: No Painful intercourse?: No Weak stream?: Yes Currently pregnant?: No Vaginal bleeding?: No Last menstrual period?: n  Gastrointestinal Nausea?: No Vomiting?: No Indigestion/heartburn?: No Diarrhea?: No Constipation?: No  Constitutional Fever: No Night sweats?: No Weight loss?: No Fatigue?: No  Skin Skin rash/lesions?: No Itching?: No  Eyes Blurred vision?: No Double vision?: No  Ears/Nose/Throat Sore throat?: No Sinus problems?: No  Hematologic/Lymphatic Swollen glands?: No Easy bruising?: No  Cardiovascular Leg swelling?: No Chest pain?:  No  Respiratory Cough?: No Shortness of breath?: No  Endocrine Excessive thirst?: No  Musculoskeletal Back pain?: No Joint pain?: No  Neurological Headaches?: No Dizziness?: Yes  Psychologic Depression?: No Anxiety?: No  Physical Exam: BP (!) 141/62   Pulse 66   Ht 5' (1.524 m)   Wt 145 lb (65.8 kg)   BMI 28.32 kg/m   Constitutional:  Alert and oriented, No acute distress.  Accompanied by daughter who provides much of the history.   HEENT: [place] AT, moist mucus membranes.  Trachea midline, no masses. Cardiovascular: No clubbing, cyanosis, or edema. Respiratory: Normal respiratory effort, no increased work of breathing. GI: Abdomen is soft, nontender, nondistended, no abdominal masses GU: No CVA tenderness.  Skin: No rashes, bruises or suspicious lesions. Neurologic: Grossly intact, no focal deficits, moving all 4 extremities.  Walks with cane.   Psychiatric: Normal mood and affect.  Laboratory Data: Lab Results  Component Value Date   WBC 7.0 10/20/2015   HGB 10.2 (L) 10/20/2015   HCT 30.3 (L) 10/20/2015   MCV 88.3 10/20/2015   PLT 84 (L) 10/20/2015    Lab Results  Component Value Date   CREATININE 0.83 10/20/2015     Lab Results  Component Value Date   HGBA1C 5.3 10/20/2015    Urinalysis Component     Latest Ref Rng & Units 10/18/2015  pH     5.0 - 8.0 6.0  Protein     NEGATIVE mg/dL 30 (A)  Nitrite     NEGATIVE NEGATIVE  Color, Urine     YELLOW YELLOW (A)  Appearance     CLEAR CLEAR (A)  Glucose     NEGATIVE mg/dL NEGATIVE  Bilirubin Urine     NEGATIVE NEGATIVE  Ketones, ur     NEGATIVE mg/dL NEGATIVE  Specific Gravity, Urine     1.005 - 1.030 1.003 (L)  Hgb urine dipstick     NEGATIVE 3+ (A)  Leukocytes, UA     NEGATIVE NEGATIVE  RBC / HPF     0 - 5 RBC/hpf 6-30  WBC, UA     0 - 5 WBC/hpf 0-5  Bacteria, UA     NONE SEEN RARE (A)  Squamous Epithelial / LPF     NONE SEEN 0-5 (A)    Pertinent Imaging: CLINICAL DATA:   82 year old female with hematuria  EXAM: CT ABDOMEN AND PELVIS WITHOUT CONTRAST  TECHNIQUE: Multidetector CT imaging of the abdomen and pelvis was performed following the standard protocol without IV contrast.  COMPARISON:  CT dated 11/04/2009  FINDINGS: Evaluation of this exam is limited in the absence of intravenous contrast.  Lower chest: Emphysematous changes of the lung bases with interstitial coarsening. There is heavy calcification of the mitral annulus as well as coronary vascular calcification.  No intra-abdominal free air or free fluid.  Hepatobiliary: Cholecystectomy. There is irregularity of the hepatic contour concerning for underlying cirrhosis. Clinical correlation is recommended. No intrahepatic biliary ductal dilatation.  Pancreas: Unremarkable. No pancreatic ductal dilatation or surrounding inflammatory changes.  Spleen: Normal in size without focal abnormality.  Adrenals/Urinary Tract: There is no hydronephrosis or nephrolithiasis on either side. The visualized ureters appear grossly unremarkable. There is a 1.2 x 1.5 cm nodular soft tissue lesion arising from the dome of the bladder adjacent to the right UVJ (series 2, image 60) concerning for neoplasm. Further evaluation with cystoscopy recommended.  Stomach/Bowel: Constipation. Small scattered colonic diverticula. There is no evidence of bowel obstruction or active inflammation. The appendix is not visualized with certainty. No inflammatory changes identified in the right lower quadrant.  Vascular/Lymphatic: There is advanced aortoiliac atherosclerotic disease. The aorta is ectatic and measures up to 2.2 cm in diameter. No portal venous gas identified. Evaluation of the vasculature is limited in the absence of intravenous contrast.  Reproductive: The uterus and the ovaries are grossly unremarkable.  Other: None  Musculoskeletal: Advanced osteopenia with degenerative changes  of the spine and hips. No definite acute fracture.  IMPRESSION: Nodular soft tissue lesion arising from the dome of the bladder adjacent to the right UVJ. Further evaluation with cystoscopy recommended. No hydronephrosis or nephrolithiasis.  Constipation. No evidence of bowel obstruction or active inflammation.   Electronically Signed   By: Anner Crete M.D.   On: 10/19/2015 02:56  CT scan personally reviewed today.  Assessment & Plan:    1. Bladder mass CT scan, ongoing gross hematuria, and history of smoking all highly concerning for primary bladder malignancy. Given her overall poor state of health, I recommend confirmatory office cystoscopy prior to recommending any further surgical intervention to confirm presence of tumor.  She and her daughter agreeable to this plan.  2. Gross hematuria Likely secondary to bladder tumor   Schedule cysto (with spanish translator)  Hollice Espy, MD  [facility] [address] [place], [place] [address] [phone]

## 2015-11-03 ENCOUNTER — Ambulatory Visit: Payer: Medicare Other | Admitting: Urology

## 2015-11-13 ENCOUNTER — Ambulatory Visit (INDEPENDENT_AMBULATORY_CARE_PROVIDER_SITE_OTHER): Payer: Medicare Other | Admitting: Urology

## 2015-11-13 ENCOUNTER — Other Ambulatory Visit: Payer: Self-pay | Admitting: Family Medicine

## 2015-11-13 ENCOUNTER — Other Ambulatory Visit
Admission: RE | Admit: 2015-11-13 | Discharge: 2015-11-13 | Disposition: A | Discharge status: Home / Self Care | Payer: Medicare Other | Source: Ambulatory Visit | Attending: Urology | Admitting: Urology

## 2015-11-13 ENCOUNTER — Encounter: Payer: Self-pay | Admitting: Urology

## 2015-11-13 VITALS — BP 152/62 | HR 72

## 2015-11-13 DIAGNOSIS — N3289 Other specified disorders of bladder: Secondary | ICD-10-CM

## 2015-11-13 DIAGNOSIS — R3129 Other microscopic hematuria: Secondary | ICD-10-CM

## 2015-11-13 DIAGNOSIS — R310 Gross hematuria: Secondary | ICD-10-CM

## 2015-11-13 MED ORDER — LIDOCAINE HCL 2 % EX GEL
1.0000 "application " | Freq: Once | CUTANEOUS | Status: AC
  Administered 2015-11-13: 1 via URETHRAL

## 2015-11-13 MED ORDER — CIPROFLOXACIN HCL 500 MG PO TABS
500.0000 mg | ORAL_TABLET | Freq: Once | ORAL | Status: AC
  Administered 2015-11-13: 500 mg via ORAL

## 2015-11-16 NOTE — Progress Notes (Signed)
   11/16/15  CC:  Chief Complaint  Patient presents with  . Cysto    HPI: 82 year old female who returns to the office for cystoscopy in setting of gross hematuria and 1.2 x 1.5 cm nodular soft tissue lesion arising from the right dome of bladder on noncontrast CT abdomen pelvis.  She does have a history of smoking.  UA deferred today as patient was unable to void, had recent negative UA other than for blood.  Blood pressure (!) 152/62, pulse 72. NED. A&Ox3.   No respiratory distress   Abd soft, NT, ND Normal external genitalia with patent urethral meatus  Cystoscopy Procedure Note  Patient identification was confirmed, informed consent was obtained, and patient was prepped using Betadine solution.  Lidocaine jelly was administered per urethral meatus.    Preoperative abx where received prior to procedure.    Procedure: - Flexible cystoscope introduced, without any difficulty.   - Thorough search of the bladder revealed:  Visualization quite poor today in the presence of gross hematuria. The bladder was aspirated and irrigated several times in order to facilitate better visualization. Ultimately, there was a papillary hypervascular tumor noted on the right dome of the bladder concerning for primary transitional cell carcinoma. Otherwise, visualization was somewhat limited.  Post-Procedure: - Patient tolerated the procedure well  Assessment/ Plan:  1. Bladder mass Presence of tumor confirmed today on cystoscopy.  No obvious lymphadenopathy or hydronephrosis concerning for advanced disease appreciated on noncontrast CT scan although this is somewhat limited given the lack of contrast.  Given her age and medical comorbidities, options were discussed in detail. The natural history of bladder cancer was discussed in detail.  Without intervention, the disease will likely progress and she'll continue to have gross hematuria.  Options were discussed today in detail. These include  no intervention given her age and comorbidities versus proceeding to the operating room room for TURBT. She is somewhat high risk for anesthesia given her history of CAD and CVA.  She and her daughter both understand and would like to proceed.  Risks including bleeding, perforation of the bladder, damage to surrounding structures, infection, amongst others were discussed.  We'll plan for TURBT, insulation mitomycin, and bilateral retrograde pyelogram.  - ciprofloxacin (CIPRO) tablet 500 mg; Take 1 tablet (500 mg total) by mouth once. - lidocaine (XYLOCAINE) 2 % jelly 1 application; Place 1 application into the urethra once.  2. Gross hematuria As above   Hollice Espy, MD

## 2015-12-01 ENCOUNTER — Other Ambulatory Visit: Payer: Self-pay | Admitting: Radiology

## 2015-12-01 ENCOUNTER — Telehealth: Payer: Self-pay | Admitting: Radiology

## 2015-12-01 DIAGNOSIS — N3289 Other specified disorders of bladder: Secondary | ICD-10-CM

## 2015-12-01 NOTE — Telephone Encounter (Signed)
Notified pt's daughter, Jana Half, of surgery scheduled with Dr Erlene Quan on 12/21/15, pre-admit testing appt on 12/08/15 @7 :30 per Martha's request & to call Friday prior to surgery for arrival time to SDS. Jana Half voices understanding.

## 2015-12-01 NOTE — Telephone Encounter (Signed)
-----   Message from Hollice Espy, MD sent at 11/16/2015  4:00 PM EST ----- Regarding: schedule small TURBT, bilateral RTG, mitomycin I saw this patient in [place] on Friday.  She needs to be booked for small TURBT, bilateral RTG, mitomycin.  Ancef 1 g, CBC, UCx preop  Will need cardiac clearance in general for anesthesia...not currently taking ASA due to hematuria  Hollice Espy, MD

## 2015-12-08 ENCOUNTER — Inpatient Hospital Stay: Admission: RE | Admit: 2015-12-08 | Payer: Medicare Other | Source: Ambulatory Visit

## 2015-12-09 ENCOUNTER — Other Ambulatory Visit: Payer: Medicare Other

## 2015-12-11 DIAGNOSIS — Z01818 Encounter for other preprocedural examination: Secondary | ICD-10-CM | POA: Insufficient documentation

## 2015-12-11 LAB — CBC
HCT: 35.4 % (ref 35.0–47.0)
HEMOGLOBIN: 11.9 g/dL — AB (ref 12.0–16.0)
MCH: 30 pg (ref 26.0–34.0)
MCHC: 33.5 g/dL (ref 32.0–36.0)
MCV: 89.6 fL (ref 80.0–100.0)
Platelets: 164 10*3/uL (ref 150–440)
RBC: 3.96 MIL/uL (ref 3.80–5.20)
RDW: 16.2 % — ABNORMAL HIGH (ref 11.5–14.5)
WBC: 9.1 10*3/uL (ref 3.6–11.0)

## 2015-12-11 LAB — BASIC METABOLIC PANEL
Anion gap: 7 (ref 5–15)
BUN: 18 mg/dL (ref 6–20)
CHLORIDE: 101 mmol/L (ref 101–111)
CO2: 30 mmol/L (ref 22–32)
Calcium: 9.6 mg/dL (ref 8.9–10.3)
Creatinine, Ser: 0.94 mg/dL (ref 0.44–1.00)
GFR calc non Af Amer: 52 mL/min — ABNORMAL LOW (ref >60)
Glucose, Bld: 137 mg/dL — ABNORMAL HIGH (ref 65–99)
POTASSIUM: 4.8 mmol/L (ref 3.5–5.1)
SODIUM: 138 mmol/L (ref 135–145)

## 2015-12-11 NOTE — Pre-Procedure Instructions (Signed)
Cardiac clearance received from Dr. Emily Filbert, and placed on chart.

## 2015-12-11 NOTE — Patient Instructions (Signed)
Your procedure is scheduled on: December 21, 2015 (Monday) Su procedimiento est programado para: Report to Same Day Surgery.Second Floor , [facility] a: To find out your arrival time please call [phone] between 1PM - 3PM on December 18, 2015 (Friday). Para saber su hora de llegada por favor llame al [phone] entre la 1PM - 3PM el da:  Remember: Instructions that are not followed completely may result in serious medical risk, up to and including death, or upon the discretion of your surgeon and anesthesiologist your surgery may need to be rescheduled.  Recuerde: Las instrucciones que no se siguen completamente Heritage manager en un riesgo de salud grave, incluyendo hasta la [place] o a discrecin de su cirujano y Environmental health practitioner, su ciruga se puede posponer.   __x__ 1. Do not eat food or drink liquids after midnight. No gum chewing or hard candies.  No coma alimentos ni tome lquidos despus de la medianoche.  No mastique chicle ni caramelos  duros.     __x__ 2. No alcohol for 24 hours before or after surgery.    No tome alcohol durante las 24 horas antes ni despus de la [place].   ____ 3. Bring all medications with you on the day of surgery if instructed.    Lleve todos los medicamentos con usted el da de su ciruga si se le ha indicado as.   __x__ 4. Notify your doctor if there is any change in your medical condition (cold, fever,                             infections).    Informe a su mdico si hay algn cambio en su condicin mdica (resfriado, fiebre, infecciones).   Do not wear jewelry, make-up, hairpins, clips or nail polish.  No use joyas, maquillajes, pinzas/ganchos para el cabello ni esmalte de uas.  Do not wear lotions, powders, or perfumes. You may wear deodorant.  No use lociones, polvos o perfumes.  Puede usar desodorante.    Do not shave 48 hours prior to surgery. Men may shave face and neck.  No se afeite 48 horas antes de la [place].  Los  hombres pueden [employer] cara y el cuello.   Do not bring valuables to the hospital.   No lleve objetos [facility] is not responsible for any belongings or valuables.  [facility] no se hace responsable de ningn tipo de pertenencias u objetos de Geographical information systems officer.               Contacts, dentures or bridgework may not be worn into surgery.  Los lentes de [place], las dentaduras postizas o puentes no se pueden usar en la [place].  Leave your suitcase in the car. After surgery it may be brought to your room.  Deje su maleta en el auto.  Despus de la ciruga podr traerla a su habitacin.  For patients admitted to the hospital, discharge time is determined by your treatment team.  Para los pacientes que sean ingresados al hospital, el tiempo en el cual se le dar de alta es determinado por su                equipo de [place].   Patients discharged the day of surgery will not be allowed to drive home. A los pacientes que se les da de alta el mismo da de la ciruga no se les permitir conducir a Holiday representative.  Please read over the following fact sheets that you were given: Por favor [place] informacin que le dieron:   MRSA Information   __x_ Take these medicines the morning of surgery with A SIP OF WATER:          [employer] medicinas la maana de la ciruga con UN SORBO DE AGUA:  1. Lisinopril  2.   3.   4.       5.  6.  ____ Fleet Enema (as directed)          Enema de Fleet (segn lo indicado)    ____ Use CHG Soap as directed          Utilice el jabn de CHG segn lo indicado  ____ Use inhalers on the day of surgery          Use los inhaladores el da de la ciruga  ____ Stop metformin 2 days prior to surgery          Deje de tomar el metformin 2 das antes de la ciruga    ____ Take 1/2 of usual insulin dose the night before surgery and none on the morning of surgery           Tome la mitad de la dosis habitual de insulina la noche antes  de la [place] y no tome nada en la maana de la             ciruga  _x__ Stop Coumadin/Plavix/aspirin on (NO ASPIRIN)          Deje de tomar el Coumadin/Plavix/aspirina el da:  _x___ Stop Anti-inflammatories on (NO NSAIDS, or  Aspirin Products)          Deje de tomar Regulatory affairs officer:   __x__ Stop supplements until after surgery  (Stop Vitamin B-12 until after surgery)          Deje de tomar suplementos hasta despus de la ciruga  ____ Bring C-Pap to the hospital          [facility]

## 2015-12-14 ENCOUNTER — Encounter
Admission: RE | Admit: 2015-12-14 | Discharge: 2015-12-14 | Disposition: A | Discharge status: Home / Self Care | Payer: Medicare Other | Source: Ambulatory Visit | Attending: Urology | Admitting: Urology

## 2015-12-14 DIAGNOSIS — Z01818 Encounter for other preprocedural examination: Secondary | ICD-10-CM | POA: Insufficient documentation

## 2015-12-14 HISTORY — DX: Unspecified osteoarthritis, unspecified site: M19.90

## 2015-12-14 HISTORY — DX: Anemia, unspecified: D64.9

## 2015-12-14 HISTORY — DX: Personal history of peptic ulcer disease: Z87.11

## 2015-12-14 HISTORY — DX: Personal history of other diseases of the digestive system: Z87.19

## 2015-12-15 LAB — URINE CULTURE: Culture: >=100000 — AB

## 2015-12-16 ENCOUNTER — Telehealth: Payer: Self-pay

## 2015-12-16 DIAGNOSIS — N390 Urinary tract infection, site not specified: Secondary | ICD-10-CM

## 2015-12-16 MED ORDER — CEPHALEXIN 500 MG PO CAPS: 500.0000 mg | ORAL_CAPSULE | Freq: Three times a day (TID) | ORAL | 0 refills | Status: DC

## 2015-12-16 NOTE — Telephone Encounter (Signed)
Spoke with pt daughter and made aware pt had a +ucx. Made aware abx were sent to pharmacy. Daughter voiced understanding.

## 2015-12-16 NOTE — Pre-Procedure Instructions (Signed)
Urine culture result sent to Dr. Erlene Quan fore review.

## 2015-12-16 NOTE — Telephone Encounter (Signed)
-----   Message from Hollice Espy, MD sent at 12/15/2015  7:13 PM EST ----- Please treat with keflex 500 mg tid for 7 days

## 2015-12-21 ENCOUNTER — Ambulatory Visit: Payer: Medicare Other | Admitting: Anesthesiology

## 2015-12-21 ENCOUNTER — Encounter
Admission: RE | Disposition: A | Discharge status: Home / Self Care | Payer: Self-pay | Source: Ambulatory Visit | Attending: Urology

## 2015-12-21 ENCOUNTER — Telehealth: Payer: Self-pay | Admitting: Urology

## 2015-12-21 ENCOUNTER — Ambulatory Visit
Admission: RE | Admit: 2015-12-21 | Discharge: 2015-12-21 | Disposition: A | Discharge status: Home / Self Care | Payer: Medicare Other | Source: Ambulatory Visit | Attending: Urology | Admitting: Urology

## 2015-12-21 ENCOUNTER — Encounter: Payer: Self-pay | Admitting: Anesthesiology

## 2015-12-21 DIAGNOSIS — I2510 Atherosclerotic heart disease of native coronary artery without angina pectoris: Secondary | ICD-10-CM | POA: Diagnosis not present

## 2015-12-21 DIAGNOSIS — Z87891 Personal history of nicotine dependence: Secondary | ICD-10-CM | POA: Diagnosis not present

## 2015-12-21 DIAGNOSIS — M1990 Unspecified osteoarthritis, unspecified site: Secondary | ICD-10-CM | POA: Diagnosis not present

## 2015-12-21 DIAGNOSIS — D649 Anemia, unspecified: Secondary | ICD-10-CM | POA: Diagnosis not present

## 2015-12-21 DIAGNOSIS — I350 Nonrheumatic aortic (valve) stenosis: Secondary | ICD-10-CM | POA: Diagnosis not present

## 2015-12-21 DIAGNOSIS — Z7984 Long term (current) use of oral hypoglycemic drugs: Secondary | ICD-10-CM | POA: Insufficient documentation

## 2015-12-21 DIAGNOSIS — I10 Essential (primary) hypertension: Secondary | ICD-10-CM | POA: Diagnosis not present

## 2015-12-21 DIAGNOSIS — Z951 Presence of aortocoronary bypass graft: Secondary | ICD-10-CM | POA: Diagnosis not present

## 2015-12-21 DIAGNOSIS — J8410 Pulmonary fibrosis, unspecified: Secondary | ICD-10-CM | POA: Insufficient documentation

## 2015-12-21 DIAGNOSIS — J45909 Unspecified asthma, uncomplicated: Secondary | ICD-10-CM | POA: Insufficient documentation

## 2015-12-21 DIAGNOSIS — E119 Type 2 diabetes mellitus without complications: Secondary | ICD-10-CM | POA: Insufficient documentation

## 2015-12-21 DIAGNOSIS — C674 Malignant neoplasm of posterior wall of bladder: Secondary | ICD-10-CM | POA: Diagnosis not present

## 2015-12-21 DIAGNOSIS — N3289 Other specified disorders of bladder: Secondary | ICD-10-CM

## 2015-12-21 DIAGNOSIS — R310 Gross hematuria: Secondary | ICD-10-CM | POA: Diagnosis not present

## 2015-12-21 DIAGNOSIS — D494 Neoplasm of unspecified behavior of bladder: Secondary | ICD-10-CM | POA: Diagnosis not present

## 2015-12-21 HISTORY — PX: CYSTOSCOPY W/ RETROGRADES: SHX1426

## 2015-12-21 HISTORY — PX: TRANSURETHRAL RESECTION OF BLADDER TUMOR WITH MITOMYCIN-C: SHX6459

## 2015-12-21 LAB — GLUCOSE, CAPILLARY
GLUCOSE-CAPILLARY: 110 mg/dL — AB (ref 65–99)
GLUCOSE-CAPILLARY: 126 mg/dL — AB (ref 65–99)

## 2015-12-21 SURGERY — TRANSURETHRAL RESECTION OF BLADDER TUMOR WITH MITOMYCIN-C
Anesthesia: General | Site: Ureter | Wound class: Clean Contaminated

## 2015-12-21 MED ORDER — DOCUSATE SODIUM 100 MG PO CAPS: 100.0000 mg | ORAL_CAPSULE | Freq: Two times a day (BID) | ORAL | 0 refills | Status: DC

## 2015-12-21 MED ORDER — FENTANYL CITRATE (PF) 100 MCG/2ML IJ SOLN: 25.0000 ug | INTRAMUSCULAR | 0 refills | Status: DC | PRN

## 2015-12-21 MED ORDER — FAMOTIDINE 20 MG PO TABS: 20.0000 mg | ORAL_TABLET | Freq: Once | ORAL | Status: DC

## 2015-12-21 MED ORDER — FAMOTIDINE 20 MG PO TABS: 20.0000 mg | ORAL_TABLET | Freq: Once | ORAL | 0 refills | Status: DC

## 2015-12-21 MED ORDER — MITOMYCIN CHEMO FOR BLADDER INSTILLATION 40 MG
40.0000 mg | Freq: Once | INTRAVENOUS | Status: DC
  Filled 2015-12-21: qty 40

## 2015-12-21 MED ORDER — LACTATED RINGERS IV SOLN
INTRAVENOUS | Status: DC | PRN
  Administered 2015-12-21: 09:00:00 via INTRAVENOUS

## 2015-12-21 MED ORDER — SODIUM CHLORIDE 0.9 % IV SOLN
INTRAVENOUS | Status: DC
  Administered 2015-12-21: 1000 mL via INTRAVENOUS

## 2015-12-21 MED ORDER — IOTHALAMATE MEGLUMINE 43 % IV SOLN
INTRAVENOUS | Status: DC | PRN
  Administered 2015-12-21: 15 mL

## 2015-12-21 MED ORDER — CEFAZOLIN IN D5W 1 GM/50ML IV SOLN
1.0000 g | INTRAVENOUS | Status: AC
  Administered 2015-12-21: 1 g via INTRAVENOUS

## 2015-12-21 MED ORDER — HYDROCODONE-ACETAMINOPHEN 5-325 MG PO TABS: 1.0000 | ORAL_TABLET | Freq: Four times a day (QID) | ORAL | 0 refills | Status: DC | PRN

## 2015-12-21 MED ORDER — FAMOTIDINE 20 MG PO TABS
ORAL_TABLET | ORAL | Status: AC
  Filled 2015-12-21: qty 1

## 2015-12-21 MED ORDER — EPHEDRINE SULFATE 50 MG/ML IJ SOLN
INTRAMUSCULAR | Status: DC | PRN
  Administered 2015-12-21: 5 mg via INTRAVENOUS
  Administered 2015-12-21: 10 mg via INTRAVENOUS

## 2015-12-21 MED ORDER — FENTANYL CITRATE (PF) 100 MCG/2ML IJ SOLN: 25.0000 ug | INTRAMUSCULAR | Status: DC | PRN

## 2015-12-21 MED ORDER — MITOMYCIN CHEMO FOR BLADDER INSTILLATION 40 MG: 40.0000 mg | Freq: Once | INTRAVENOUS | 0 refills | Status: AC

## 2015-12-21 MED ORDER — MITOMYCIN CHEMO FOR BLADDER INSTILLATION 40 MG
INTRAVENOUS | Status: DC | PRN
  Administered 2015-12-21: 40 mg via INTRAVESICAL

## 2015-12-21 MED ORDER — LIDOCAINE HCL (CARDIAC) 20 MG/ML IV SOLN
INTRAVENOUS | Status: DC | PRN
  Administered 2015-12-21: 80 mg via INTRAVENOUS

## 2015-12-21 MED ORDER — CEFAZOLIN IN D5W 1 GM/50ML IV SOLN
INTRAVENOUS | Status: AC
  Filled 2015-12-21: qty 50

## 2015-12-21 MED ORDER — FENTANYL CITRATE (PF) 100 MCG/2ML IJ SOLN
INTRAMUSCULAR | Status: DC | PRN
  Administered 2015-12-21: 50 ug via INTRAVENOUS

## 2015-12-21 MED ORDER — PHENYLEPHRINE HCL 10 MG/ML IJ SOLN
INTRAMUSCULAR | Status: DC | PRN
  Administered 2015-12-21: 200 ug via INTRAVENOUS
  Administered 2015-12-21 (×3): 100 ug via INTRAVENOUS

## 2015-12-21 MED ORDER — PROPOFOL 10 MG/ML IV BOLUS
INTRAVENOUS | Status: DC | PRN
  Administered 2015-12-21: 100 mg via INTRAVENOUS
  Administered 2015-12-21: 50 mg via INTRAVENOUS

## 2015-12-21 MED ORDER — SUCCINYLCHOLINE CHLORIDE 20 MG/ML IJ SOLN
INTRAMUSCULAR | Status: DC | PRN
  Administered 2015-12-21: 50 mg via INTRAVENOUS

## 2015-12-21 MED ORDER — ONDANSETRON HCL 4 MG/2ML IJ SOLN: 4.0000 mg | Freq: Once | INTRAMUSCULAR | Status: DC | PRN

## 2015-12-21 SURGICAL SUPPLY — 33 items
BAG DRAIN CYSTO-URO LG1000N (MISCELLANEOUS) ×4 IMPLANT
BAG URO DRAIN 2000ML W/SPOUT (MISCELLANEOUS) ×4 IMPLANT
CATH FOL 2WAY LX 16X5 (CATHETERS) ×4 IMPLANT
CATH FOLEY 2WAY  5CC 16FR (CATHETERS)
CATH URETL 5X70 OPEN END (CATHETERS) ×4 IMPLANT
CATH URTH 16FR FL 2W BLN LF (CATHETERS) IMPLANT
CONRAY 43 FOR UROLOGY 50M (MISCELLANEOUS) ×4 IMPLANT
DRAPE UTILITY 15X26 TOWEL STRL (DRAPES) ×4 IMPLANT
DRESSING TELFA 4X3 1S ST N-ADH (GAUZE/BANDAGES/DRESSINGS) ×4 IMPLANT
ELECT LOOP 22F BIPOLAR SML (ELECTROSURGICAL) ×4
ELECT REM PT RETURN 9FT ADLT (ELECTROSURGICAL)
ELECTRODE LOOP 22F BIPOLAR SML (ELECTROSURGICAL) ×2 IMPLANT
ELECTRODE REM PT RTRN 9FT ADLT (ELECTROSURGICAL) IMPLANT
GLOVE BIO SURGEON STRL SZ 6.5 (GLOVE) ×3 IMPLANT
GLOVE BIO SURGEONS STRL SZ 6.5 (GLOVE) ×1
GOWN STRL REUS W/ TWL LRG LVL3 (GOWN DISPOSABLE) ×4 IMPLANT
GOWN STRL REUS W/TWL LRG LVL3 (GOWN DISPOSABLE) ×4
KIT RM TURNOVER CYSTO AR (KITS) ×4 IMPLANT
LOOP CUT BIPOLAR 24F LRG (ELECTROSURGICAL) IMPLANT
NDL SAFETY ECLIPSE 18X1.5 (NEEDLE) ×2 IMPLANT
NEEDLE HYPO 18GX1.5 SHARP (NEEDLE) ×2
PACK CYSTO AR (MISCELLANEOUS) ×4 IMPLANT
SCRUB POVIDONE IODINE 4 OZ (MISCELLANEOUS) ×4 IMPLANT
SENSORWIRE 0.038 NOT ANGLED (WIRE) ×4
SET CYSTO W/LG BORE CLAMP LF (SET/KITS/TRAYS/PACK) IMPLANT
SET IRRIG Y TYPE TUR BLADDER L (SET/KITS/TRAYS/PACK) ×4 IMPLANT
SET IRRIGATING DISP (SET/KITS/TRAYS/PACK) ×4 IMPLANT
SOL .9 NS 3000ML IRR  AL (IV SOLUTION) ×2
SOL .9 NS 3000ML IRR UROMATIC (IV SOLUTION) ×2 IMPLANT
SURGILUBE 2OZ TUBE FLIPTOP (MISCELLANEOUS) ×4 IMPLANT
SYRINGE IRR TOOMEY STRL 70CC (SYRINGE) ×4 IMPLANT
WATER STERILE IRR 1000ML POUR (IV SOLUTION) ×4 IMPLANT
WIRE SENSOR 0.038 NOT ANGLED (WIRE) ×2 IMPLANT

## 2015-12-21 NOTE — Anesthesia Preprocedure Evaluation (Addendum)
Anesthesia Evaluation  Patient identified by MRN, date of birth, ID band Patient awake    Reviewed: Allergy & Precautions, NPO status , Patient's Chart, lab work & pertinent test results, reviewed documented beta blocker date and time   Airway Mallampati: II  TM Distance: >3 FB     Dental  (+) Upper Dentures, Lower Dentures   Pulmonary asthma , former smoker,           Cardiovascular hypertension, Pt. on medications + CAD and + CABG       Neuro/Psych    GI/Hepatic   Endo/Other  diabetes, Type 2  Renal/GU      Musculoskeletal  (+) Arthritis ,   Abdominal   Peds  Hematology  (+) anemia ,   Anesthesia Other Findings Pulmonary fibrosis. Echo done in October showed good function ef 55-60. Mild aortic stenosis.  Reproductive/Obstetrics                           Anesthesia Physical Anesthesia Plan  ASA: III  Anesthesia Plan: General   Post-op Pain Management:    Induction: Intravenous  Airway Management Planned: Oral ETT  Additional Equipment:   Intra-op Plan:   Post-operative Plan:   Informed Consent: I have reviewed the patients History and Physical, chart, labs and discussed the procedure including the risks, benefits and alternatives for the proposed anesthesia with the patient or authorized representative who has indicated his/her understanding and acceptance.     Plan Discussed with: CRNA  Anesthesia Plan Comments:         Anesthesia Quick Evaluation

## 2015-12-21 NOTE — Op Note (Signed)
Date of procedure: 12/21/15  Preoperative diagnosis:  1. Bladder mass 2. Gross hematuria   Postoperative diagnosis:  1. Same as above   Procedure: 1. TURBT, medium 2. Bilateral retrograde pyelogram 3. Instillation of intravesical mitomycin  Surgeon: Hollice Espy, MD  Anesthesia: General  Complications: None  Intraoperative findings: Nodular 3 cm tumor with hypervascularity on right posterior wall bladder. No other intravesical findings. Bilateral retrograde pyelogram unremarkable.  EBL: Minimal  Specimens: Bladder tumor, deep base of bladder tumor  Drains: 16 Fr Foley  Indication: Ariana Miller is a 82 y.o. patient with gross hematuria found to have a nodular bladder tumor on her right posterior wall.  After reviewing the management options for treatment, she/ her daughter elected to proceed with the above surgical procedure(s). We have discussed the potential benefits and risks of the procedure, side effects of the proposed treatment, the likelihood of the patient achieving the goals of the procedure, and any potential problems that might occur during the procedure or recuperation. Informed consent has been obtained.  Description of procedure:  The patient was taken to the operating room and general anesthesia was induced.  The patient was placed in the dorsal lithotomy position, prepped and draped in the usual sterile fashion, and preoperative antibiotics were administered. A preoperative time-out was performed.   A bimanual pelvic exam revealed no obvious palpable tumor and a non-fixed bladder.  A 21 French scope was advanced per urethra into the bladder. Careful inspection of the bladder revealed approximately 3 cm nodular, well-circumscribed tumor on the right posterior wall of the bladder on a broad base. The remainder of the bladder was unremarkable. The trigone was normal anatomic position with UOs patent.  Attention was then turned to the right ureteral orifice  which was cannulated using a 5 [place] open-ended ureteral catheter. A gentle retrograde pyelogram was performed on this side which revealed a slightly full right renal pelvis but otherwise no hydroureteronephrosis or filling defects. The same procedure was performed on the left side, left retrograde revealing a decompressed collecting system less full than the right without any filling defects or hydronephrosis.  Using image 26 French resectoscope, the tumor was taken down to the base using hot loop and saline as the medium. Call cup biopsy forceps were used take additional biopsies of the base of the tumor and along the edge which was passed off as deep base which was sent separate from the looped resection. The resection was taken down to the muscularis propria which was clearly visible. No perforations were appreciated. Careful hemostasis was then achieved. All bladder tumor chips were evacuated from the bladder.  Once hemostasis was achieved, the scope was removed. A 16 French Foley catheter was placed and 10 cc was used to inflate the balloon. The patient was cleaned and dried, repositioned the supine position, reversed by anesthesia, taken the PACU in stable condition. 40 mg of intravesical mitomycin was administered and remained in place for 1 hour and the PACU. She tolerated this well. The bladder was then drained and the Foley was removed.  Plan: She follow-up in 2 weeks for pathology review.  Hollice Espy, M.D.

## 2015-12-21 NOTE — Anesthesia Postprocedure Evaluation (Signed)
Anesthesia Post Note  Patient: Ariana Miller  Procedure(s) Performed: Procedure(s) (LRB): TRANSURETHRAL RESECTION OF BLADDER TUMOR WITH MITOMYCIN-C (N/A) CYSTOSCOPY WITH RETROGRADE PYELOGRAM (Bilateral)  Patient location during evaluation: PACU Anesthesia Type: General Level of consciousness: awake and alert Pain management: pain level controlled Vital Signs Assessment: post-procedure vital signs reviewed and stable Respiratory status: spontaneous breathing, nonlabored ventilation, respiratory function stable and patient connected to nasal cannula oxygen Cardiovascular status: blood pressure returned to baseline and stable Postop Assessment: no signs of nausea or vomiting Anesthetic complications: no     Last Vitals:  Vitals:   12/21/15 1125 12/21/15 1200  BP: (!) 133/41 (!) 129/46  Pulse: 76 65  Resp: 16 16  Temp: 36.5 C 36.4 C    Last Pain:  Vitals:   12/21/15 1200  TempSrc: Temporal  PainSc: 0-No pain                 Vidal Lampkins S

## 2015-12-21 NOTE — H&P (Signed)
   11/16/15 --> updated 12/21/15 without change  CC:     Chief Complaint  Patient presents with  . Cysto    HPI: 82 year old female who returns to the office for cystoscopy in setting of gross hematuria and 1.2 x 1.5 cm nodular soft tissue lesion arising from the right dome of bladder on noncontrast CT abdomen pelvis.  She does have a history of smoking.  UA deferred today as patient was unable to void, had recent negative UA other than for blood.  Blood pressure (!) 152/62, pulse 72. NED. A&Ox3.   No respiratory distress . CTAB. RRR. Abd soft, NT, ND Normal external genitalia with patent urethral meatus  Cystoscopy Procedure Note  Patient identification was confirmed, informed consent was obtained, and patient was prepped using Betadine solution.  Lidocaine jelly was administered per urethral meatus.    Preoperative abx where received prior to procedure.    Procedure: - Flexible cystoscope introduced, without any difficulty.   - Thorough search of the bladder revealed:  Visualization quite poor today in the presence of gross hematuria. The bladder was aspirated and irrigated several times in order to facilitate better visualization. Ultimately, there was a papillary hypervascular tumor noted on the right dome of the bladder concerning for primary transitional cell carcinoma. Otherwise, visualization was somewhat limited.  Post-Procedure: - Patient tolerated the procedure well  Assessment/ Plan:  1. Bladder mass Presence of tumor confirmed today on cystoscopy.  No obvious lymphadenopathy or hydronephrosis concerning for advanced disease appreciated on noncontrast CT scan although this is somewhat limited given the lack of contrast.  Given her age and medical comorbidities, options were discussed in detail. The natural history of bladder cancer was discussed in detail.  Without intervention, the disease will likely progress and she'll continue to have gross  hematuria.  Options were discussed today in detail. These include no intervention given her age and comorbidities versus proceeding to the operating room room for TURBT. She is somewhat high risk for anesthesia given her history of CAD and CVA.  She and her daughter both understand and would like to proceed.  Risks including bleeding, perforation of the bladder, damage to surrounding structures, infection, amongst others were discussed.  We'll plan for TURBT, insulation mitomycin, and bilateral retrograde pyelogram.  - ciprofloxacin (CIPRO) tablet 500 mg; Take 1 tablet (500 mg total) by mouth once. - lidocaine (XYLOCAINE) 2 % jelly 1 application; Place 1 application into the urethra once.  2. Gross hematuria As above   Hollice Espy, MD

## 2015-12-21 NOTE — Anesthesia Procedure Notes (Signed)
Procedure Name: Intubation Date/Time: 12/21/2015 9:06 AM Performed by: Naomie Dean Pre-anesthesia Checklist: Patient identified, Emergency Drugs available, Suction available, Patient being monitored and Timeout performed Patient Re-evaluated:Patient Re-evaluated prior to inductionOxygen Delivery Method: Circle system utilized Preoxygenation: Pre-oxygenation with 100% oxygen Intubation Type: IV induction Ventilation: Mask ventilation without difficulty Laryngoscope Size: Mac and 3 Grade View: Grade I Tube type: Oral Number of attempts: 1 Secured at: 21 cm Tube secured with: Tape Dental Injury: Teeth and Oropharynx as per pre-operative assessment  Comments: Attempted LMA x 2 , leak, changed to ETT

## 2015-12-21 NOTE — Transfer of Care (Signed)
Immediate Anesthesia Transfer of Care Note  Patient: Ariana Miller  Procedure(s) Performed: Procedure(s): TRANSURETHRAL RESECTION OF BLADDER TUMOR WITH MITOMYCIN-C (N/A) CYSTOSCOPY WITH RETROGRADE PYELOGRAM (Bilateral)  Patient Location: PACU  Anesthesia Type:General  Level of Consciousness: sedated  Airway & Oxygen Therapy: Patient connected to face mask oxygen  Post-op Assessment: Report given to RN  Post vital signs: stable  Last Vitals:  Vitals:   12/21/15 0835  BP: 134/63  Pulse: 71  Resp: 16  Temp: 36.5 C    Last Pain:  Vitals:   12/21/15 0835  TempSrc: Oral         Complications: No apparent anesthesia complications

## 2015-12-21 NOTE — Discharge Instructions (Signed)
Transurethral Resection of Bladder Tumor (TURBT) or Bladder Biopsy ° ° °Definition: ° Transurethral Resection of the Bladder Tumor is a surgical procedure used to diagnose and remove tumors within the bladder. TURBT is the most common treatment for early stage bladder cancer. ° °General instructions: °   ° Your recent bladder surgery requires very little post hospital care but some definite precautions. ° °Despite the fact that no skin incisions were used, the area around the bladder incisions are raw and covered with scabs to promote healing and prevent bleeding. Certain precautions are needed to insure that the scabs are not disturbed over the next 2-4 weeks while the healing proceeds. ° °Because the raw surface inside your bladder and the irritating effects of urine you may expect frequency of urination and/or urgency (a stronger desire to urinate) and perhaps even getting up at night more often. This will usually resolve or improve slowly over the healing period. You may see some blood in your urine over the first 6 weeks. Do not be alarmed, even if the urine was clear for a while. Get off your feet and drink lots of fluids until clearing occurs. If you start to pass clots or don't improve call us. ° °Diet: ° °You may return to your normal diet immediately. Because of the raw surface of your bladder, alcohol, spicy foods, foods high in acid and drinks with caffeine may cause irritation or frequency and should be used in moderation. To keep your urine flowing freely and avoid constipation, drink plenty of fluids during the day (8-10 glasses). Tip: Avoid cranberry juice because it is very acidic. ° °Activity: ° °Your physical activity doesn't need to be restricted. However, if you are very active, you may see some blood in the urine. We suggest that you reduce your activity under the circumstances until the bleeding has stopped. ° °Bowels: ° °It is important to keep your bowels regular during the postoperative  period. Straining with bowel movements can cause bleeding. A bowel movement every other day is reasonable. Use a mild laxative if needed, such as milk of magnesia 2-3 tablespoons, or 2 Dulcolax tablets. Call if you continue to have problems. If you had been taking narcotics for pain, before, during or after your surgery, you may be constipated. Take a laxative if necessary. ° ° ° °Medication: ° °You should resume your pre-surgery medications unless told not to. In addition you may be given an antibiotic to prevent or treat infection. Antibiotics are not always necessary. All medication should be taken as prescribed until the bottles are finished unless you are having an unusual reaction to one of the drugs. ° ° °[facility] °[address], [address] °[place], [place] [address] °[phone] ° ° ° ° °

## 2015-12-22 LAB — SURGICAL PATHOLOGY

## 2016-01-07 ENCOUNTER — Ambulatory Visit: Payer: Medicare Other | Admitting: Urology

## 2016-01-26 ENCOUNTER — Telehealth: Payer: Self-pay | Admitting: Urology

## 2016-01-26 NOTE — Telephone Encounter (Signed)
Called to discuss surgical pathology with patient's health care proxy, her daughter, Ermelinda Das.  Tumor c/w HgT1 TCC.  There was muscle in the specimen and uninvolved.  We had a lengthy discussion by telephone today. Given her mother's age and comorbidities, would recommend deferring re-TUR which is generally recommended per AUA guidelines.  We discussed that with repeat surgery, tumor could be upgraded up to 10% of the time to muscle invasive tumor. If she were to be diagnosed with muscle invasion, she is not a candidate for cystectomy and not a good candidate for daily radiation/chemotherapy. As such, we'll defer risk of additional anesthetic.  We also discussed BCG today at length. We discussed the risk and benefits. Given her overall frailty, I have concern that she is high risk for complication from this.  As such, we agreed for continued surveillance with cystoscopy but we'll defer further surgical intervention and immunotherapy at this time.  All questions were answered.   Hollice Espy, MD

## 2016-01-28 NOTE — Telephone Encounter (Signed)
done

## 2016-03-25 ENCOUNTER — Encounter: Payer: Self-pay | Admitting: Urology

## 2016-03-25 ENCOUNTER — Ambulatory Visit (INDEPENDENT_AMBULATORY_CARE_PROVIDER_SITE_OTHER): Payer: Medicare Other | Admitting: Urology

## 2016-03-25 VITALS — BP 127/70 | HR 75 | Ht 60.0 in | Wt 142.5 lb

## 2016-03-25 DIAGNOSIS — Z08 Encounter for follow-up examination after completed treatment for malignant neoplasm: Secondary | ICD-10-CM

## 2016-03-25 DIAGNOSIS — Z8551 Personal history of malignant neoplasm of bladder: Secondary | ICD-10-CM | POA: Diagnosis not present

## 2016-03-25 DIAGNOSIS — C679 Malignant neoplasm of bladder, unspecified: Secondary | ICD-10-CM

## 2016-03-25 LAB — URINALYSIS, COMPLETE
BILIRUBIN UA: NEGATIVE
Glucose, UA: NEGATIVE
KETONES UA: NEGATIVE
Leukocytes, UA: NEGATIVE
Nitrite, UA: NEGATIVE
PH UA: 5.5 (ref 5.0–7.5)
PROTEIN UA: NEGATIVE
SPEC GRAV UA: 1.015 (ref 1.005–1.030)
UUROB: 0.2 mg/dL (ref 0.2–1.0)

## 2016-03-25 LAB — MICROSCOPIC EXAMINATION
BACTERIA UA: NEGATIVE
WBC UA: NEGATIVE /HPF (ref 0–?)

## 2016-03-25 MED ORDER — CIPROFLOXACIN HCL 500 MG PO TABS
500.0000 mg | ORAL_TABLET | Freq: Once | ORAL | Status: AC
  Administered 2016-03-25: 500 mg via ORAL

## 2016-03-25 MED ORDER — LIDOCAINE HCL 2 % EX GEL
1.0000 "application " | Freq: Once | CUTANEOUS | Status: AC
  Administered 2016-03-25: 1 via URETHRAL

## 2016-03-25 NOTE — Progress Notes (Signed)
In and Out Catheterization  Patient is present today for a I & O catheterization due to Cysto procedure.  Patient was cleaned and prepped in a sterile fashion with betadine and Lidocaine 2% jelly was instilled into the urethra.  A 14FR cath was inserted no complications were noted , 60 ml of urine return was noted, urine was light yellow in color. A clean urine sample was collected for urinalysis. Bladder was drained  And catheter was removed with out difficulty.    Preformed by: K.Russell,CMA

## 2016-03-25 NOTE — Progress Notes (Signed)
   03/25/16  CC:  Chief Complaint  Patient presents with  . Cysto    HPI: 83 year old female with history of bladder cancer who presents today for office cystoscopy for surveillance.  She was initially found to have a large bladder tumor after developing recurrent gross hematuria. She was taken to the operating room on 12/21/2015 for TURBT, bilateral retrograde pyelogram, instillation of mitomycin. Intraoperatively, the tumor was noted to be a 3 cm nodular tumor with hypervascularity on the right posterior wall of the bladder.  Surgical pathology was consistent with high-grade T1 TCC. There was muscle in the specimen and uninvolved. After discussion with her healthcare proxy daughter, she elected not to return to the operating room for re-TUR given her age and comorbidities. She also declined BCG.  She returns today for surveillance cystoscopy. She's had no further gross hematuria since surgery. Overall she is doing quite well. She does have her rash and external genitalia which her daughter thinks is related the patient using BenGay ointment by accident instead of her barrier cream.  Blood pressure 127/70, pulse 75, height 5' (1.524 m), weight 142 lb 8 oz (64.6 kg). NED. A&Ox3.   No respiratory distress   Abd soft, NT, ND Normal external genitalia with patent urethral meatus  Cystoscopy Procedure Note  Patient identification was confirmed, informed consent was obtained, and patient was prepped using Betadine solution.  Lidocaine jelly was administered per urethral meatus.    Preoperative abx where received prior to procedure.    Procedure: - Flexible cystoscope introduced, without any difficulty.   - Thorough search of the bladder revealed:    normal urethral meatus    normal urothelium    no stones    no ulcers     no tumors    no urethral polyps    no trabeculation   Stellate scar seen on the right posterior wall bladder but no evidence of tumor recurrence at this  location.  There is some mild hypervascularity in the central aspect of the scar.  - Ureteral orifices were normal in position and appearance.  Post-Procedure: - Patient tolerated the procedure well  Assessment/ Plan:  1. Malignant neoplasm of urinary bladder, unspecified site [facility]) h/o HgT1 TCC s/p TURBT 12/17 Currently NED Declined re-TUR/ BCG given age and frailty Will follow with q3 month cysto - Urinalysis, Complete - ciprofloxacin (CIPRO) tablet 500 mg; Take 1 tablet (500 mg total) by mouth once. - lidocaine (XYLOCAINE) 2 % jelly 1 application; Place 1 application into the urethra once.  Return for cysto.  Hollice Espy, MD

## 2016-06-24 ENCOUNTER — Other Ambulatory Visit: Payer: Medicare Other | Admitting: Urology

## 2016-07-01 ENCOUNTER — Telehealth: Payer: Self-pay | Admitting: Urology

## 2016-07-01 ENCOUNTER — Encounter: Payer: Self-pay | Admitting: Urology

## 2016-07-01 ENCOUNTER — Other Ambulatory Visit: Payer: Medicare Other | Admitting: Urology

## 2016-07-01 NOTE — Telephone Encounter (Signed)
This patient missed her surveillance cystoscopy for bladder cancer. Please contact her/her daughter to reschedule ASAP.  Be sure to convey importance of routine follow-up for cancer surveillance.  Hollice Espy, MD

## 2016-07-13 NOTE — Telephone Encounter (Signed)
Spoke with Jana Half and tried to get her to bring her tomorrow but she was not able to, so she scheduled for 08-03-16.  Thanks,  Sharyn Lull

## 2016-08-03 ENCOUNTER — Other Ambulatory Visit: Payer: Medicare Other | Admitting: Urology

## 2017-03-10 IMAGING — CR DG CHEST 2V
1 series · 2 of 2 positions shown · non-contrast
Comparison: 01/05/2012

CLINICAL DATA: Shortness of breath with cough. Recent diagnosis of
bronchitis.

EXAM:
CHEST  2 VIEW

[Series 1: w chest pa · 0.14mm/px · 2 of 2 slices shown]
[im 1/2]
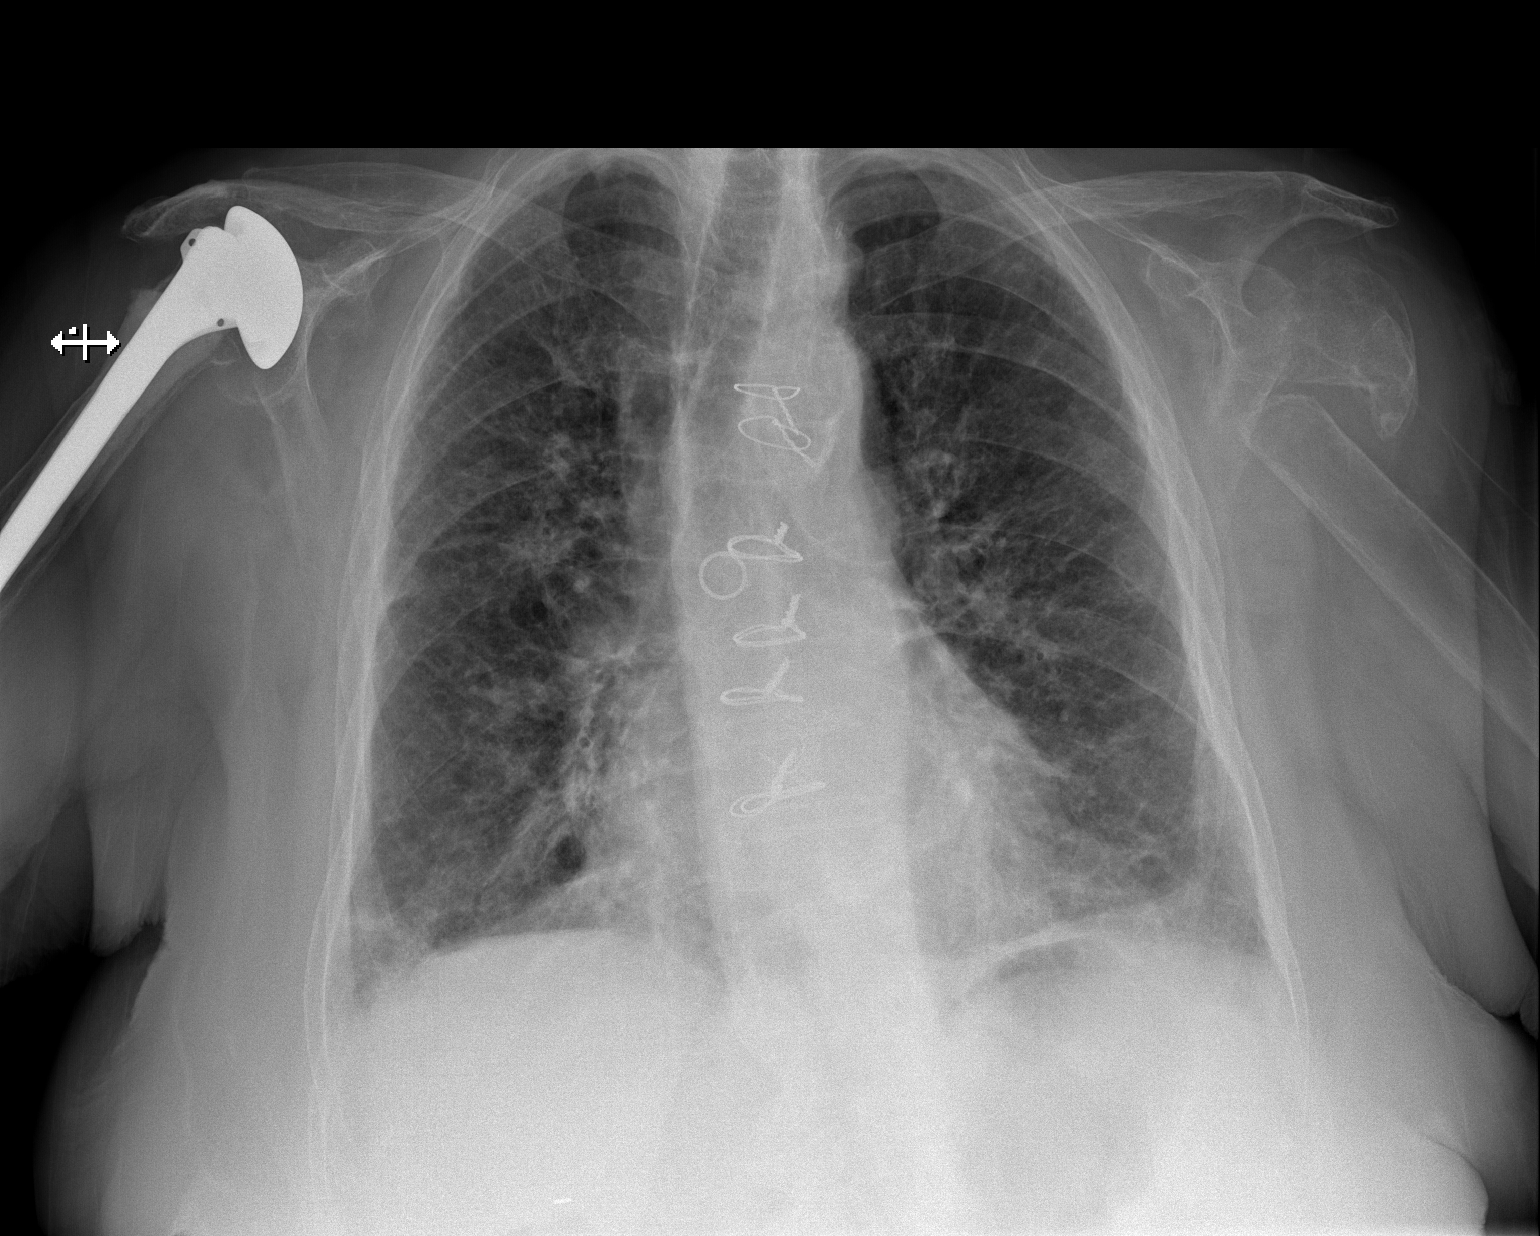
[im 2/2]
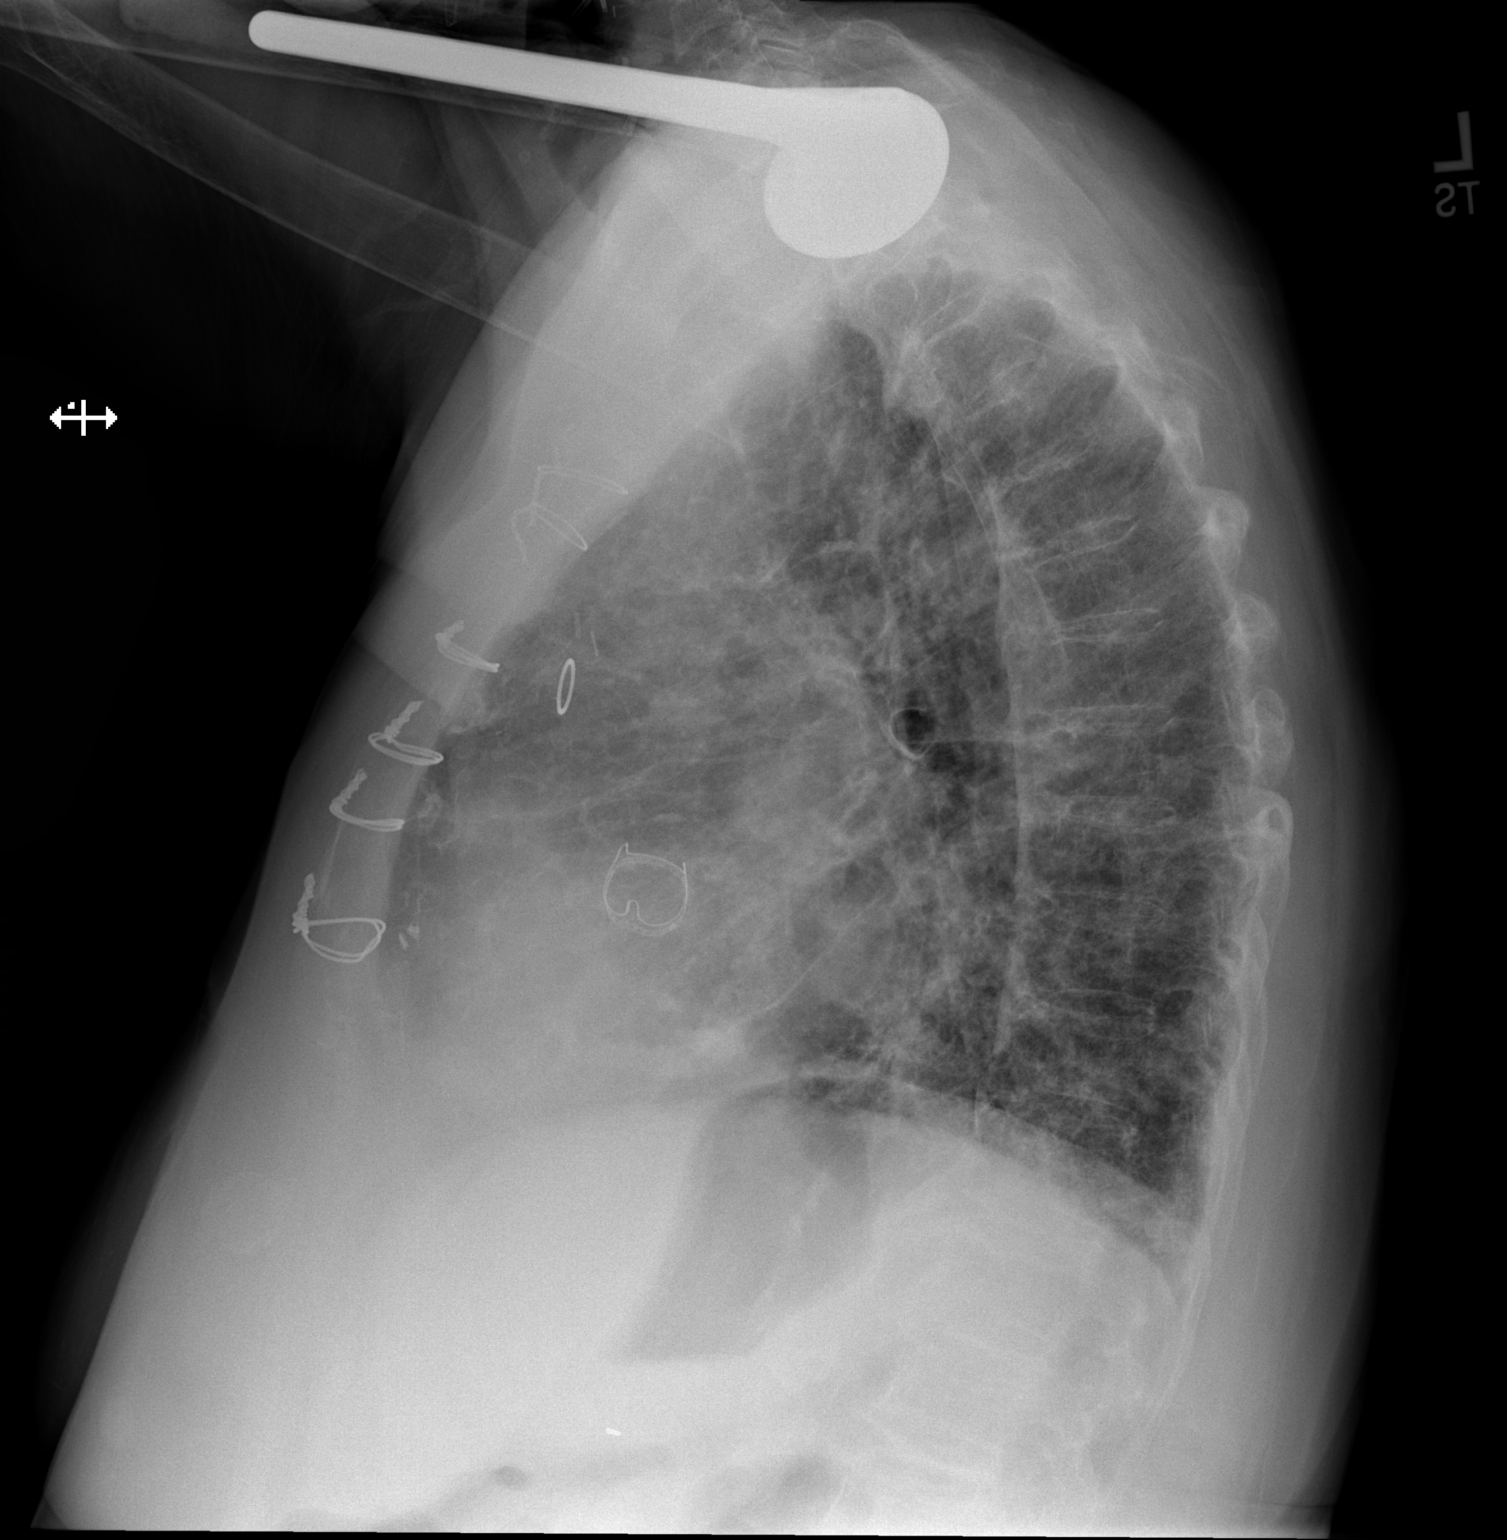

[2 of 2 positions shown; findings below may reference images not displayed]

FINDINGS: Stable changes from cardiac surgery and valve replacement. No
mediastinal or hilar masses or evidence of adenopathy. Cardiac
silhouette is normal in size.

Lungs show bilateral irregularly thickened interstitial markings
that are without substantial change from the prior study. There is
no convincing pneumonia and no evidence of pulmonary edema. No
pleural effusion or pneumothorax.

Chronic ununited fracture of the proximal left humerus is stable.
Bony thorax is demineralized. Right shoulder prosthesis is
well-seated and aligned.
IMPRESSION: 1. No acute cardiopulmonary disease.
2. Chronic findings in the lungs with parenchymal fibrosis.

## 2017-03-12 ENCOUNTER — Emergency Department
Admission: EM | Admit: 2017-03-12 | Discharge: 2017-03-13 | Disposition: A | Discharge status: Home / Self Care | Payer: Medicare Other | Attending: Emergency Medicine | Admitting: Emergency Medicine

## 2017-03-12 ENCOUNTER — Other Ambulatory Visit: Payer: Self-pay

## 2017-03-12 ENCOUNTER — Emergency Department: Payer: Medicare Other

## 2017-03-12 DIAGNOSIS — R05 Cough: Secondary | ICD-10-CM | POA: Diagnosis present

## 2017-03-12 DIAGNOSIS — I10 Essential (primary) hypertension: Secondary | ICD-10-CM | POA: Diagnosis not present

## 2017-03-12 DIAGNOSIS — J40 Bronchitis, not specified as acute or chronic: Secondary | ICD-10-CM | POA: Diagnosis not present

## 2017-03-12 DIAGNOSIS — E119 Type 2 diabetes mellitus without complications: Secondary | ICD-10-CM | POA: Insufficient documentation

## 2017-03-12 DIAGNOSIS — R0602 Shortness of breath: Secondary | ICD-10-CM

## 2017-03-12 DIAGNOSIS — Z79899 Other long term (current) drug therapy: Secondary | ICD-10-CM | POA: Insufficient documentation

## 2017-03-12 DIAGNOSIS — Z951 Presence of aortocoronary bypass graft: Secondary | ICD-10-CM | POA: Insufficient documentation

## 2017-03-12 DIAGNOSIS — I2510 Atherosclerotic heart disease of native coronary artery without angina pectoris: Secondary | ICD-10-CM | POA: Insufficient documentation

## 2017-03-12 LAB — URINALYSIS, COMPLETE (UACMP) WITH MICROSCOPIC
BACTERIA UA: NONE SEEN
Bilirubin Urine: NEGATIVE
Glucose, UA: NEGATIVE mg/dL
Ketones, ur: NEGATIVE mg/dL
Leukocytes, UA: NEGATIVE
NITRITE: NEGATIVE
PH: 6 (ref 5.0–8.0)
Protein, ur: 30 mg/dL — AB
SPECIFIC GRAVITY, URINE: 1.01 (ref 1.005–1.030)

## 2017-03-12 LAB — PROTIME-INR
INR: 1.05
Prothrombin Time: 13.6 seconds (ref 11.4–15.2)

## 2017-03-12 LAB — COMPREHENSIVE METABOLIC PANEL
ALK PHOS: 158 U/L — AB (ref 38–126)
ALT: 20 U/L (ref 14–54)
AST: 39 U/L (ref 15–41)
Albumin: 3.9 g/dL (ref 3.5–5.0)
Anion gap: 10 (ref 5–15)
BILIRUBIN TOTAL: 1 mg/dL (ref 0.3–1.2)
BUN: 24 mg/dL — AB (ref 6–20)
CO2: 22 mmol/L (ref 22–32)
Calcium: 8.5 mg/dL — ABNORMAL LOW (ref 8.9–10.3)
Chloride: 102 mmol/L (ref 101–111)
Creatinine, Ser: 0.78 mg/dL (ref 0.44–1.00)
GFR calc Af Amer: >60 mL/min (ref >60)
GFR calc non Af Amer: >60 mL/min (ref >60)
GLUCOSE: 153 mg/dL — AB (ref 65–99)
POTASSIUM: 4 mmol/L (ref 3.5–5.1)
Sodium: 134 mmol/L — ABNORMAL LOW (ref 135–145)
TOTAL PROTEIN: 8.7 g/dL — AB (ref 6.5–8.1)

## 2017-03-12 LAB — CBC WITH DIFFERENTIAL/PLATELET
BASOS PCT: 1 %
Basophils Absolute: 0.1 10*3/uL (ref 0–0.1)
Eosinophils Absolute: 0.1 10*3/uL (ref 0–0.7)
Eosinophils Relative: 1 %
HEMATOCRIT: 38.5 % (ref 35.0–47.0)
Hemoglobin: 12.6 g/dL (ref 12.0–16.0)
LYMPHS ABS: 1.2 10*3/uL (ref 1.0–3.6)
Lymphocytes Relative: 8 %
MCH: 29.8 pg (ref 26.0–34.0)
MCHC: 32.8 g/dL (ref 32.0–36.0)
MCV: 90.9 fL (ref 80.0–100.0)
MONO ABS: 1.6 10*3/uL — AB (ref 0.2–0.9)
MONOS PCT: 11 %
NEUTROS ABS: 12.3 10*3/uL — AB (ref 1.4–6.5)
Neutrophils Relative %: 79 %
Platelets: 99 10*3/uL — ABNORMAL LOW (ref 150–440)
RBC: 4.24 MIL/uL (ref 3.80–5.20)
RDW: 17.1 % — AB (ref 11.5–14.5)
WBC: 15.3 10*3/uL — ABNORMAL HIGH (ref 3.6–11.0)

## 2017-03-12 LAB — LACTIC ACID, PLASMA: Lactic Acid, Venous: 1.8 mmol/L (ref 0.5–1.9)

## 2017-03-12 LAB — INFLUENZA PANEL BY PCR (TYPE A & B)
INFLAPCR: NEGATIVE
INFLBPCR: NEGATIVE

## 2017-03-12 MED ORDER — ACETAMINOPHEN 325 MG PO TABS
650.0000 mg | ORAL_TABLET | Freq: Once | ORAL | Status: AC
  Administered 2017-03-12: 650 mg via ORAL

## 2017-03-12 MED ORDER — SODIUM CHLORIDE 0.9 % IV BOLUS (SEPSIS)
500.0000 mL | Freq: Once | INTRAVENOUS | Status: AC
  Administered 2017-03-12: 500 mL via INTRAVENOUS

## 2017-03-12 MED ORDER — AZITHROMYCIN 250 MG PO TABS: 250.0000 mg | ORAL_TABLET | Freq: Every day | ORAL | 0 refills | Status: DC

## 2017-03-12 MED ORDER — ACETAMINOPHEN 325 MG PO TABS
ORAL_TABLET | ORAL | Status: AC
  Filled 2017-03-12: qty 2

## 2017-03-12 MED ORDER — AZITHROMYCIN 500 MG PO TABS
500.0000 mg | ORAL_TABLET | Freq: Once | ORAL | Status: AC
  Administered 2017-03-13: 500 mg via ORAL
  Filled 2017-03-12: qty 1

## 2017-03-12 NOTE — ED Triage Notes (Addendum)
Pt here with daughter; daughter says pt was in [place] with her while she attended a class the last 3 days; daughter says on the way home she noticed pt was hot to touch; reports cough, nonproductive; denies urinary s/s; pt reports back pain, worse with cough;

## 2017-03-12 NOTE — Discharge Instructions (Addendum)
Take the antibiotic as prescribed starting tomorrow and finish the full 4-day course.  Return to the emergency department for new, worsening, or persistent fevers, weakness, difficulty breathing, chest pain, vomiting, inability to tolerate the medication, wheezing, or any other new or worsening symptoms that concern you.

## 2017-03-12 NOTE — ED Notes (Signed)
Po fluids and food provided to pt's daughter per daughter request.

## 2017-03-13 DIAGNOSIS — J40 Bronchitis, not specified as acute or chronic: Secondary | ICD-10-CM | POA: Diagnosis not present

## 2017-03-13 NOTE — ED Provider Notes (Signed)
[facility] Emergency Department Provider Note ____________________________________________   First MD Initiated Contact with Patient 03/12/17 2154     (approximate)  I have reviewed the triage vital signs and the nursing notes.   HISTORY  Chief Complaint Cough and Fever    HPI Ariana Miller is a 84 y.o. female with past medical history as noted below who presents with cough for the last 2 days, persistent coarse, nonproductive, and associated with low-grade fever today.  Per the daughter, the patient had some coarse breath sounds, but no wheezing, and did not appear significantly short of breath.  Per the daughter, some other family members had viral type symptoms recently and were exposed the patient, but she has had no other recent illness or sick contacts.   Past Medical History:  Diagnosis Date  . Anemia   . Arthritis   . Asthma   . CAD (coronary artery disease)   . Diabetes mellitus without complication ([place])   . H/O aortic valve disorder   . History of gastric ulcer   . HTN (hypertension)     Patient Active Problem List   Diagnosis Date Noted  . Benign essential hypertension 10/30/2015  . Rheumatic disease of heart valve 10/27/2015  . Weakness 10/19/2015  . Pressure injury of skin 10/19/2015  . Interstitial pulmonary fibrosis ([place]) 03/12/2015  . Reactive airway disease that is not asthma 03/02/2015  . Hyponatremia 03/02/2015  . Diabetes mellitus type 2, controlled, without complications ([place]) 10/19/5100  . Accelerated hypertension 03/02/2015  . CAD (coronary artery disease) 03/02/2015  . Hyperlipidemia, mixed 02/26/2015    Past Surgical History:  Procedure Laterality Date  . AORTIC VALVE REPLACEMENT (AVR)/CORONARY ARTERY BYPASS GRAFTING (CABG)    . CARDIAC VALVE REPLACEMENT    . CAROTID ENDARTERECTOMY    . CHOLECYSTECTOMY    . CORONARY ARTERY BYPASS GRAFT    . CYSTOSCOPY W/ RETROGRADES Bilateral 12/21/2015   Procedure:  CYSTOSCOPY WITH RETROGRADE PYELOGRAM;  Surgeon: Hollice Espy, MD;  Location: [facility] ORS;  Service: Urology;  Laterality: Bilateral;  . EYE SURGERY Right    Catract Extraction with IOL  . TRANSURETHRAL RESECTION OF BLADDER TUMOR WITH MITOMYCIN-C N/A 12/21/2015   Procedure: TRANSURETHRAL RESECTION OF BLADDER TUMOR WITH MITOMYCIN-C;  Surgeon: Hollice Espy, MD;  Location: [facility] ORS;  Service: Urology;  Laterality: N/A;    Prior to Admission medications   Medication Sig Start Date End Date Taking? Authorizing Provider  azithromycin (ZITHROMAX) 250 MG tablet Take 1 tablet (250 mg total) by mouth daily for 4 days. 03/13/17 03/17/17  Arta Silence, MD  Cyanocobalamin (VITAMIN B-12 PO) Take 1 tablet by mouth daily.    [provider]  famotidine (PEPCID) 20 MG tablet Take 1 tablet (20 mg total) by mouth once. 12/21/15 12/21/15  Hollice Espy, MD  feeding supplement (BOOST HIGH PROTEIN) LIQD Take 1 Container by mouth as directed. Once to twice daily    [provider]  folic acid (FOLVITE) 1 MG tablet Take 1 mg by mouth daily.    [provider]  glimepiride (AMARYL) 1 MG tablet Take 0.5 mg by mouth daily with breakfast.    [provider]  lisinopril (PRINIVIL,ZESTRIL) 5 MG tablet Take 1 tablet (5 mg total) by mouth daily. Patient taking differently: Take 2.5 mg by mouth daily.  10/20/15   Nicholes Mango, MD  Multiple Vitamin (MULTIVITAMIN WITH MINERALS) TABS tablet Take 1 tablet by mouth daily.    [provider]    Allergies Patient has  no known allergies.  Family History  Problem Relation Age of Onset  . Hepatitis Mother   . Liver disease Father   . Asthma Unknown   . Osteoporosis Unknown   . Hypertension Unknown   . Bladder Cancer Neg Hx   . Kidney cancer Neg Hx     Social History Social History   Tobacco Use  . Smoking status: Former Smoker    Types: Cigarettes  . Smokeless tobacco: Never Used  Substance Use Topics  . Alcohol  use: No  . Drug use: No    Review of Systems  Constitutional: Positive for fever. Eyes: No redness. ENT: No sore throat. Cardiovascular: Denies chest pain. Respiratory: Positive for cough. Gastrointestinal: No vomiting. Genitourinary: Negative for dysuria.  Musculoskeletal: Negative for back pain. Skin: Negative for rash. Neurological: Negative for headache.   ____________________________________________   PHYSICAL EXAM:  VITAL SIGNS: ED Triage Vitals  Enc Vitals Group     BP 03/12/17 2117 (!) 135/100     Pulse Rate 03/12/17 2117 (!) 120     Resp 03/12/17 2117 (!) 22     Temp 03/12/17 2117 (!) 100.4 F (38 C)     Temp Source 03/12/17 2117 Oral     SpO2 03/12/17 2117 98 %     Weight 03/12/17 2119 153 lb (69.4 kg)     Height --      Head Circumference --      Peak Flow --      Pain Score 03/12/17 2118 8     Pain Loc --      Pain Edu? --      Excl. in [place]? --     Constitutional: Alert and oriented. Well appearing and in no acute distress. Eyes: Conjunctivae are normal.  Head: Atraumatic. Nose: No congestion/rhinnorhea. Mouth/Throat: Mucous membranes are moist.   Neck: Normal range of motion.  Cardiovascular: Normal rate, regular rhythm. Grossly normal heart sounds.  Good peripheral circulation. Respiratory: Normal respiratory effort.  No retractions.  Slightly coarse breath sounds bilaterally but no wheezing,  rales, or wheeze decreased air entry. Gastrointestinal: Soft and nontender. No distention.  Genitourinary: No flank tenderness. Musculoskeletal: Trace bilateral lower extremity edema.  Extremities warm and well perfused.  Neurologic:  Normal speech and language. No gross focal neurologic deficits are appreciated.  Skin:  Skin is warm and dry. No rash noted. Psychiatric: Mood and affect are normal. Speech and behavior are normal.  ____________________________________________   LABS (all labs ordered are listed, but only abnormal results are  displayed)  Labs Reviewed  COMPREHENSIVE METABOLIC PANEL - Abnormal; Notable for the following components:      Result Value   Sodium 134 (*)    Glucose, Bld 153 (*)    BUN 24 (*)    Calcium 8.5 (*)    Total Protein 8.7 (*)    Alkaline Phosphatase 158 (*)    All other components within normal limits  CBC WITH DIFFERENTIAL/PLATELET - Abnormal; Notable for the following components:   WBC 15.3 (*)    RDW 17.1 (*)    Platelets 99 (*)    Neutro Abs 12.3 (*)    Monocytes Absolute 1.6 (*)    All other components within normal limits  URINALYSIS, COMPLETE (UACMP) WITH MICROSCOPIC - Abnormal; Notable for the following components:   Color, Urine STRAW (*)    APPearance CLEAR (*)    Hgb urine dipstick MODERATE (*)    Protein, ur 30 (*)    Squamous Epithelial / LPF  0-5 (*)    All other components within normal limits  CULTURE, BLOOD (ROUTINE X 2)  CULTURE, BLOOD (ROUTINE X 2)  LACTIC ACID, PLASMA  INFLUENZA PANEL BY PCR (TYPE A & B)  PROTIME-INR   ____________________________________________  EKG   ____________________________________________  RADIOLOGY  CXR: No focal infiltrate or other acute findings  ____________________________________________   PROCEDURES  Procedure(s) performed: No  Procedures  Critical Care performed: No ____________________________________________   INITIAL IMPRESSION / ASSESSMENT AND PLAN / ED COURSE  Pertinent labs & imaging results that were available during my care of the patient were reviewed by me and considered in my medical decision making (see chart for details).  84 year old female with past medical history as noted above presents with cough for the last 2 days with associated low-grade fever, but no significant shortness of breath.  Past medical records reviewed in Epic and are noncontributory; the patient was last admitted in 2017 for a hemorrhagic stroke.  On exam, the patient is remarkably well-appearing for age, her vital  signs are normal except for low-grade fever and borderline tachycardia, her lungs are slightly coarse but otherwise clear and there is no respiratory distress.  Neuro exam is nonfocal.  She appears relatively well hydrated.  Differential includes pneumonia, acute bronchitis either viral or bacterial, influenza, versus less likely UTI.  Given the presence of fever there is no evidence of cardiac etiology.  Plan: Infection/sepsis workup, chest x-ray, small IV fluid bolus and antipyretics for symptomatic treatment, and reassess.  ----------------------------------------- 12:06 AM on 03/13/2017 -----------------------------------------  Chest x-ray shows no focal infiltrate, and patient's influenza is negative.  Her UA shows some RBCs but no findings consistent with UTI.  Lactate is not elevated.  Her vital signs have improved, with resolved fever and heart rate during my most recent reassessment in the high 90s to 100.  Based on this workup, the presentation is most consistent with viral versus arterial bronchitis.  Given the patient's history of diabetes and her age, she may be somewhat immunocompromise so I will give an empiric antibiotic course.  Based on patient's reassuring vital signs, lab workup, and her overall clinical appearance, she does not require admission at this time.  She is appropriate for outpatient treatment.  The daughter and the patient agree with going home.  I explained the results of the workup and the plan of care, as well as discharge instructions and return precautions, and the daughter expressed understanding.     ____________________________________________   FINAL CLINICAL IMPRESSION(S) / ED DIAGNOSES  Final diagnoses:  Bronchitis      NEW MEDICATIONS STARTED DURING THIS VISIT:  Discharge Medication List as of 03/12/2017 11:41 PM    START taking these medications   Details  azithromycin (ZITHROMAX) 250 MG tablet Take 1 tablet (250 mg total) by mouth daily  for 4 days., Starting Mon 03/13/2017, Until Fri 03/17/2017, Print         Note:  This document was prepared using Dragon voice recognition software and may include unintentional dictation errors.    Arta Silence, MD 03/13/17 [id number]

## 2017-03-14 ENCOUNTER — Emergency Department: Payer: Medicare Other

## 2017-03-14 ENCOUNTER — Encounter: Payer: Self-pay | Admitting: Emergency Medicine

## 2017-03-14 ENCOUNTER — Encounter: Payer: Self-pay | Admitting: Urology

## 2017-03-14 ENCOUNTER — Observation Stay
Admission: EM | Admit: 2017-03-14 | Discharge: 2017-03-16 | Disposition: A | Discharge status: Home / Self Care | Payer: Medicare Other | Attending: Internal Medicine | Admitting: Internal Medicine

## 2017-03-14 ENCOUNTER — Other Ambulatory Visit: Payer: Self-pay

## 2017-03-14 ENCOUNTER — Ambulatory Visit (INDEPENDENT_AMBULATORY_CARE_PROVIDER_SITE_OTHER): Payer: Medicare Other | Admitting: Urology

## 2017-03-14 VITALS — BP 169/85 | HR 107 | Temp 99.4°F

## 2017-03-14 DIAGNOSIS — R509 Fever, unspecified: Secondary | ICD-10-CM | POA: Insufficient documentation

## 2017-03-14 DIAGNOSIS — E119 Type 2 diabetes mellitus without complications: Secondary | ICD-10-CM

## 2017-03-14 DIAGNOSIS — Z7984 Long term (current) use of oral hypoglycemic drugs: Secondary | ICD-10-CM | POA: Insufficient documentation

## 2017-03-14 DIAGNOSIS — Z87891 Personal history of nicotine dependence: Secondary | ICD-10-CM | POA: Diagnosis not present

## 2017-03-14 DIAGNOSIS — E785 Hyperlipidemia, unspecified: Secondary | ICD-10-CM | POA: Diagnosis not present

## 2017-03-14 DIAGNOSIS — I350 Nonrheumatic aortic (valve) stenosis: Secondary | ICD-10-CM | POA: Diagnosis not present

## 2017-03-14 DIAGNOSIS — E871 Hypo-osmolality and hyponatremia: Secondary | ICD-10-CM | POA: Diagnosis not present

## 2017-03-14 DIAGNOSIS — E876 Hypokalemia: Secondary | ICD-10-CM | POA: Diagnosis present

## 2017-03-14 DIAGNOSIS — I2510 Atherosclerotic heart disease of native coronary artery without angina pectoris: Secondary | ICD-10-CM | POA: Diagnosis not present

## 2017-03-14 DIAGNOSIS — Z952 Presence of prosthetic heart valve: Secondary | ICD-10-CM | POA: Diagnosis not present

## 2017-03-14 DIAGNOSIS — N3289 Other specified disorders of bladder: Secondary | ICD-10-CM | POA: Diagnosis not present

## 2017-03-14 DIAGNOSIS — Z66 Do not resuscitate: Secondary | ICD-10-CM | POA: Diagnosis not present

## 2017-03-14 DIAGNOSIS — I10 Essential (primary) hypertension: Secondary | ICD-10-CM | POA: Diagnosis not present

## 2017-03-14 DIAGNOSIS — Z79899 Other long term (current) drug therapy: Secondary | ICD-10-CM | POA: Diagnosis not present

## 2017-03-14 DIAGNOSIS — R0600 Dyspnea, unspecified: Secondary | ICD-10-CM | POA: Diagnosis present

## 2017-03-14 DIAGNOSIS — Z951 Presence of aortocoronary bypass graft: Secondary | ICD-10-CM | POA: Insufficient documentation

## 2017-03-14 DIAGNOSIS — C679 Malignant neoplasm of bladder, unspecified: Secondary | ICD-10-CM | POA: Diagnosis not present

## 2017-03-14 DIAGNOSIS — J8410 Pulmonary fibrosis, unspecified: Secondary | ICD-10-CM | POA: Insufficient documentation

## 2017-03-14 DIAGNOSIS — R0602 Shortness of breath: Secondary | ICD-10-CM

## 2017-03-14 DIAGNOSIS — D509 Iron deficiency anemia, unspecified: Secondary | ICD-10-CM | POA: Diagnosis not present

## 2017-03-14 DIAGNOSIS — D696 Thrombocytopenia, unspecified: Secondary | ICD-10-CM | POA: Insufficient documentation

## 2017-03-14 DIAGNOSIS — F0390 Unspecified dementia without behavioral disturbance: Secondary | ICD-10-CM | POA: Insufficient documentation

## 2017-03-14 DIAGNOSIS — J205 Acute bronchitis due to respiratory syncytial virus: Secondary | ICD-10-CM | POA: Diagnosis not present

## 2017-03-14 DIAGNOSIS — R0989 Other specified symptoms and signs involving the circulatory and respiratory systems: Secondary | ICD-10-CM | POA: Diagnosis present

## 2017-03-14 DIAGNOSIS — J810 Acute pulmonary edema: Secondary | ICD-10-CM

## 2017-03-14 DIAGNOSIS — E782 Mixed hyperlipidemia: Secondary | ICD-10-CM | POA: Diagnosis present

## 2017-03-14 DIAGNOSIS — J989 Respiratory disorder, unspecified: Secondary | ICD-10-CM | POA: Diagnosis present

## 2017-03-14 HISTORY — DX: Malignant neoplasm of bladder, unspecified: C67.9

## 2017-03-14 HISTORY — DX: Nonrheumatic aortic (valve) stenosis: I35.0

## 2017-03-14 HISTORY — DX: Type 2 diabetes mellitus with unspecified complications: E11.8

## 2017-03-14 HISTORY — DX: Pulmonary fibrosis, unspecified: J84.10

## 2017-03-14 LAB — COMPREHENSIVE METABOLIC PANEL
ALBUMIN: 3.2 g/dL — AB (ref 3.5–5.0)
ALK PHOS: 88 U/L (ref 38–126)
ALT: 19 U/L (ref 14–54)
ANION GAP: 11 (ref 5–15)
AST: 39 U/L (ref 15–41)
BUN: 21 mg/dL — ABNORMAL HIGH (ref 6–20)
CALCIUM: 7.8 mg/dL — AB (ref 8.9–10.3)
CO2: 21 mmol/L — AB (ref 22–32)
Chloride: 95 mmol/L — ABNORMAL LOW (ref 101–111)
Creatinine, Ser: 1.05 mg/dL — ABNORMAL HIGH (ref 0.44–1.00)
GFR calc Af Amer: 52 mL/min — ABNORMAL LOW (ref >60)
GFR calc non Af Amer: 45 mL/min — ABNORMAL LOW (ref >60)
GLUCOSE: 223 mg/dL — AB (ref 65–99)
Potassium: 2.6 mmol/L — CL (ref 3.5–5.1)
SODIUM: 127 mmol/L — AB (ref 135–145)
Total Bilirubin: 1.1 mg/dL (ref 0.3–1.2)
Total Protein: 7 g/dL (ref 6.5–8.1)

## 2017-03-14 LAB — URINALYSIS, COMPLETE
Bilirubin, UA: NEGATIVE
Glucose, UA: NEGATIVE
Ketones, UA: NEGATIVE
LEUKOCYTES UA: NEGATIVE
Nitrite, UA: NEGATIVE
PH UA: 6 (ref 5.0–7.5)
Specific Gravity, UA: 1.015 (ref 1.005–1.030)
Urobilinogen, Ur: 0.2 mg/dL (ref 0.2–1.0)

## 2017-03-14 LAB — TROPONIN I: Troponin I: 0.06 ng/mL (ref <0.03)

## 2017-03-14 LAB — CBC
HCT: 31.5 % — ABNORMAL LOW (ref 35.0–47.0)
HEMOGLOBIN: 10.4 g/dL — AB (ref 12.0–16.0)
MCH: 30.1 pg (ref 26.0–34.0)
MCHC: 33 g/dL (ref 32.0–36.0)
MCV: 91.1 fL (ref 80.0–100.0)
PLATELETS: 74 10*3/uL — AB (ref 150–440)
RBC: 3.46 MIL/uL — AB (ref 3.80–5.20)
RDW: 17.2 % — ABNORMAL HIGH (ref 11.5–14.5)
WBC: 7.2 10*3/uL (ref 3.6–11.0)

## 2017-03-14 LAB — MICROSCOPIC EXAMINATION: WBC, UA: NONE SEEN /hpf (ref 0–?)

## 2017-03-14 LAB — BRAIN NATRIURETIC PEPTIDE: B Natriuretic Peptide: 608 pg/mL — ABNORMAL HIGH (ref 0.0–100.0)

## 2017-03-14 MED ORDER — FUROSEMIDE 10 MG/ML IJ SOLN
40.0000 mg | Freq: Once | INTRAMUSCULAR | Status: AC
  Administered 2017-03-15: 40 mg via INTRAVENOUS
  Filled 2017-03-14: qty 4

## 2017-03-14 MED ORDER — CIPROFLOXACIN HCL 500 MG PO TABS
500.0000 mg | ORAL_TABLET | Freq: Once | ORAL | Status: AC
  Administered 2017-03-14: 500 mg via ORAL

## 2017-03-14 MED ORDER — LIDOCAINE HCL 2 % EX GEL
1.0000 "application " | Freq: Once | CUTANEOUS | Status: AC
  Administered 2017-03-14: 1 via URETHRAL

## 2017-03-14 MED ORDER — POTASSIUM CHLORIDE 10 MEQ/100ML IV SOLN
10.0000 meq | Freq: Once | INTRAVENOUS | Status: DC
  Administered 2017-03-15: 10 meq via INTRAVENOUS
  Filled 2017-03-14: qty 100

## 2017-03-14 NOTE — ED Provider Notes (Signed)
[facility] Emergency Department Provider Note  Time seen: 10:18 PM  I have reviewed the triage vital signs and the nursing notes.   HISTORY  Chief Complaint Shortness of Breath    HPI Ariana Miller is a 84 y.o. female with a past medical history of CAD, anemia, arthritis, hypertension, presents to the emergency department for continued cough and shortness of breath.  According to the patient and daughter patient has been coughing for the past several days with significant shortness of breath with any ambulation or exertion.  Patient was seen in the emergency department 2 days ago diagnosed with likely bronchitis and discharged on antibiotics.  Daughter states the patient has been taking antibiotics but she continues to feel very short of breath with cough.  Daughter states now the cough is productive of sputum.  Patient had a fever to 100.5 during her last visit, daughter states intermittent fevers at home but she has been using Tylenol including several hours ago.  Currently patient appears well, no acute distress lying in bed comfortably but does have a frequent cough during examination.   Past Medical History:  Diagnosis Date  . Anemia   . Arthritis   . Asthma   . CAD (coronary artery disease)   . Diabetes mellitus without complication ([place])   . H/O aortic valve disorder   . History of gastric ulcer   . HTN (hypertension)     Patient Active Problem List   Diagnosis Date Noted  . Benign essential hypertension 10/30/2015  . Rheumatic disease of heart valve 10/27/2015  . Weakness 10/19/2015  . Pressure injury of skin 10/19/2015  . Interstitial pulmonary fibrosis ([place]) 03/12/2015  . Reactive airway disease that is not asthma 03/02/2015  . Hyponatremia 03/02/2015  . Diabetes mellitus type 2, controlled, without complications ([place]) 72/09/4707  . Accelerated hypertension 03/02/2015  . CAD (coronary artery disease) 03/02/2015  . Hyperlipidemia, mixed  02/26/2015    Past Surgical History:  Procedure Laterality Date  . AORTIC VALVE REPLACEMENT (AVR)/CORONARY ARTERY BYPASS GRAFTING (CABG)    . CARDIAC VALVE REPLACEMENT    . CAROTID ENDARTERECTOMY    . CHOLECYSTECTOMY    . CORONARY ARTERY BYPASS GRAFT    . CYSTOSCOPY W/ RETROGRADES Bilateral 12/21/2015   Procedure: CYSTOSCOPY WITH RETROGRADE PYELOGRAM;  Surgeon: Hollice Espy, MD;  Location: [facility] ORS;  Service: Urology;  Laterality: Bilateral;  . EYE SURGERY Right    Catract Extraction with IOL  . TRANSURETHRAL RESECTION OF BLADDER TUMOR WITH MITOMYCIN-C N/A 12/21/2015   Procedure: TRANSURETHRAL RESECTION OF BLADDER TUMOR WITH MITOMYCIN-C;  Surgeon: Hollice Espy, MD;  Location: [facility] ORS;  Service: Urology;  Laterality: N/A;    Prior to Admission medications   Medication Sig Start Date End Date Taking? Authorizing Provider  azithromycin (ZITHROMAX) 250 MG tablet Take 1 tablet (250 mg total) by mouth daily for 4 days. 03/13/17 03/17/17  Arta Silence, MD  Cyanocobalamin (VITAMIN B-12 PO) Take 1 tablet by mouth daily.    [provider]  famotidine (PEPCID) 20 MG tablet Take 1 tablet (20 mg total) by mouth once. 12/21/15 12/21/15  Hollice Espy, MD  feeding supplement (BOOST HIGH PROTEIN) LIQD Take 1 Container by mouth as directed. Once to twice daily    [provider]  folic acid (FOLVITE) 1 MG tablet Take 1 mg by mouth daily.    [provider]  glimepiride (AMARYL) 1 MG tablet Take 0.5 mg by mouth daily with breakfast.    [provider]  lisinopril (PRINIVIL,ZESTRIL) 5 MG tablet Take 1 tablet (5 mg total) by mouth daily. Patient taking differently: Take 2.5 mg by mouth daily.  10/20/15   Nicholes Mango, MD  Multiple Vitamin (MULTIVITAMIN WITH MINERALS) TABS tablet Take 1 tablet by mouth daily.    [provider]    No Known Allergies  Family History  Problem Relation Age of Onset  . Hepatitis Mother   . Liver disease Father   .  Asthma Unknown   . Osteoporosis Unknown   . Hypertension Unknown   . Bladder Cancer Neg Hx   . Kidney cancer Neg Hx     Social History Social History   Tobacco Use  . Smoking status: Former Smoker    Types: Cigarettes  . Smokeless tobacco: Never Used  Substance Use Topics  . Alcohol use: No  . Drug use: No    Review of Systems Constitutional: Intermittent low-grade fevers at home.  Daughter is using Tylenol for fever including several hours ago. Eyes: Negative for visual complaints ENT: Positive for congestion Cardiovascular: Negative for chest pain. Respiratory: Positive for shortness of breath.  Frequent cough. Gastrointestinal: Negative for abdominal pain, vomiting had one episode of diarrhea this morning per daughter. Genitourinary: Negative for urinary compaints Musculoskeletal: No leg pain or swelling Skin: Negative for skin complaints  Neurological: Negative for headache All other ROS negative  ____________________________________________   PHYSICAL EXAM:  VITAL SIGNS: ED Triage Vitals  Enc Vitals Group     BP 03/14/17 2045 (!) 132/40     Pulse Rate 03/14/17 2045 88     Resp 03/14/17 2045 20     Temp 03/14/17 2045 98.3 F (36.8 C)     Temp Source 03/14/17 2045 Oral     SpO2 03/14/17 2045 97 %     Weight 03/14/17 2046 153 lb (69.4 kg)     Height --      Head Circumference --      Peak Flow --      Pain Score 03/14/17 2046 0     Pain Loc --      Pain Edu? --      Excl. in [place]? --    Constitutional: Alert, lying in bed, no distress.  Frequent cough. Eyes: Normal exam ENT   Head: Normocephalic and atraumatic.   Nose: Mild rhinorrhea/congestion   Mouth/Throat: Mucous membranes are moist. Cardiovascular: Normal rate, regular rhythm.  Respiratory: No tachypnea, normal respiratory effort.  Mild rhonchi auscultated bilaterally.  Frequent cough during exam. Gastrointestinal: Soft and nontender. No distention.  Musculoskeletal: Nontender with  normal range of motion in all extremities. No lower extremity tenderness or edema. Neurologic:  Normal speech and language. No gross focal neurologic deficits Skin:  Skin is warm, dry and intact.  Psychiatric: Mood and affect are normal.   ____________________________________________    EKG  EKG reviewed and interpreted by myself shows sinus rhythm at 87 bpm with a widened QRS, normal axis, slight QTC prolongation, nonspecific ST changes.  No ST elevation.  ____________________________________________    RADIOLOGY  X-ray shows vascular congestion with interstitial edema  ____________________________________________   INITIAL IMPRESSION / ASSESSMENT AND PLAN / ED COURSE  Pertinent labs & imaging results that were available during my care of the patient were reviewed by me and considered in my medical decision making (see chart for details).  Patient presents to the emergency department for worsening cough with occasional sputum production, shortness of breath worse with exertion.  Differential would include pneumonia, bronchitis, pulmonary edema,  pneumothorax, ACS.  We will check labs, chest x-ray, EKG and continue to closely monitor.  Currently the patient is satting 97-100% on room air but she does have mild rhonchi on exam with a frequent cough.  We will assess pulse ox during ambulation, will recheck labs and continue to closely monitor. X-ray does show vascular congestion with now interstitial edema new from 2 days ago.  Patient's labs have resulted with a low sodium of 127, low potassium 2.6 elevated troponin 0.06 and an elevated BNP of 608.  Interstitial edema on chest x-ray.  We will dose IV Lasix.  We will replete potassium and admit to the hospitalist service for further treatment.  Patient and daughter agreeable to this plan of care. ____________________________________________   FINAL CLINICAL IMPRESSION(S) / ED DIAGNOSES  Congestive heart failure Shortness of  breath Cough Hyponatremia    Harvest Dark, MD 03/14/17 2340

## 2017-03-14 NOTE — ED Notes (Signed)
Date and time results received: 03/14/17   Test:  Potassium, troponin  Critical Value: 2.6, 0.06  Name of Provider Notified: Beather Arbour  Orders Received? Or Actions Taken?: MD notified

## 2017-03-14 NOTE — ED Notes (Signed)
Pt able to ambulate around the room for 5 minutes without difficulty. Pt does report feeling tired and weak after the five minutes and requests to sit down. Pt has noted increased WOB and SOB but oxygen saturation only dropped from 100% to 97%. Pt needing to sit on side of bed for a little to cath her breath.

## 2017-03-14 NOTE — ED Triage Notes (Signed)
Pt with co shob and fever states was seen here Sunday and dx with bronchitis. Pt here for persistent fever and shob.

## 2017-03-14 NOTE — Progress Notes (Signed)
In and Out Catheterization  Patient is present today for a I & O catheterization due to not being able to urinate prior to cysto. Patient was cleaned and prepped in a sterile fashion with betadine and Lidocaine 2% jelly was instilled into the urethra.  A 14FR cath was inserted no complications were noted , 130ml of urine return was noted, urine was yellow in color. A clean urine sample was collected for UA. Bladder was drained  And catheter was removed with out difficulty.    Preformed by: Alva Garnet

## 2017-03-14 NOTE — Progress Notes (Signed)
   03/14/17  CC:  Chief Complaint  Patient presents with  . Cysto    HPI: 84 year old female with history of bladder cancer who presents today for office cystoscopy for surveillance.  She is extremely overdue for her surveillance cystoscopy.  Her last cystoscopy was 03/2016 which was negative for recurrence.    Daughter is her caretaker.  She is had trouble getting her mother to appointments.  She was initially found to have a large bladder tumor after developing recurrent gross hematuria. She was taken to the operating room on 12/21/2015 for TURBT, bilateral retrograde pyelogram, instillation of mitomycin. Intraoperatively, the tumor was noted to be a 3 cm nodular tumor with hypervascularity on the right posterior wall of the bladder.  Surgical pathology was consistent with high-grade T1 TCC. There was muscle in the specimen and uninvolved. After discussion with her healthcare proxy daughter, she elected not to return to the operating room for re-TUR given her age and comorbidities. She also declined BCG.  Blood pressure (!) 169/85, pulse (!) 107, temperature 99.4 F (37.4 C). NED. A&Ox3.   No respiratory distress   Abd soft, NT, ND Normal external genitalia with patent urethral meatus  Cystoscopy Procedure Note  Patient identification was confirmed, informed consent was obtained, and patient was prepped using Betadine solution.  Lidocaine jelly was administered per urethral meatus.    Preoperative abx where received prior to procedure.    Procedure: - Flexible cystoscope introduced, without any difficulty.   - Thorough search of the bladder revealed:    normal urethral meatus    normal urothelium    no stones    no ulcers     no tumors    no urethral polyps    no trabeculation  - Ureteral orifices were normal in position and appearance.  Post-Procedure: - Patient tolerated the procedure well  Assessment/ Plan:  1. Malignant neoplasm of urinary bladder, unspecified  site [facility]) h/o HgT1 TCC s/p TURBT 12/17 Currently NED Declined re-TUR/ BCG given age and frailty Will follow with q6 month cysto (increased interval, patient understands guidelines recommend q. 3 months at this point but would like to extend surveillance given her age and comorbidities which seem reasonable) - Urinalysis, Complete - ciprofloxacin (CIPRO) tablet 500 mg; Take 1 tablet (500 mg total) by mouth once. - lidocaine (XYLOCAINE) 2 % jelly 1 application; Place 1 application into the urethra once.  Return in about 6 months (around 09/14/2017) for cysto.  Hollice Espy, MD

## 2017-03-15 ENCOUNTER — Observation Stay (HOSPITAL_BASED_OUTPATIENT_CLINIC_OR_DEPARTMENT_OTHER)
Admit: 2017-03-15 | Discharge: 2017-03-15 | Disposition: A | Discharge status: Home / Self Care | Payer: Medicare Other | Attending: Internal Medicine | Admitting: Internal Medicine

## 2017-03-15 ENCOUNTER — Other Ambulatory Visit: Payer: Self-pay

## 2017-03-15 ENCOUNTER — Encounter: Payer: Self-pay | Admitting: Internal Medicine

## 2017-03-15 DIAGNOSIS — J069 Acute upper respiratory infection, unspecified: Secondary | ICD-10-CM | POA: Diagnosis not present

## 2017-03-15 DIAGNOSIS — R0600 Dyspnea, unspecified: Secondary | ICD-10-CM | POA: Diagnosis present

## 2017-03-15 DIAGNOSIS — I25118 Atherosclerotic heart disease of native coronary artery with other forms of angina pectoris: Secondary | ICD-10-CM | POA: Diagnosis not present

## 2017-03-15 DIAGNOSIS — Z953 Presence of xenogenic heart valve: Secondary | ICD-10-CM | POA: Diagnosis not present

## 2017-03-15 DIAGNOSIS — R0603 Acute respiratory distress: Secondary | ICD-10-CM

## 2017-03-15 DIAGNOSIS — I509 Heart failure, unspecified: Secondary | ICD-10-CM

## 2017-03-15 DIAGNOSIS — B9789 Other viral agents as the cause of diseases classified elsewhere: Secondary | ICD-10-CM | POA: Diagnosis not present

## 2017-03-15 DIAGNOSIS — J205 Acute bronchitis due to respiratory syncytial virus: Secondary | ICD-10-CM | POA: Diagnosis not present

## 2017-03-15 DIAGNOSIS — J810 Acute pulmonary edema: Secondary | ICD-10-CM

## 2017-03-15 DIAGNOSIS — E876 Hypokalemia: Secondary | ICD-10-CM | POA: Diagnosis not present

## 2017-03-15 LAB — ECHOCARDIOGRAM COMPLETE
Height: 60 in
WEIGHTICAEL: 2299.84 [oz_av]

## 2017-03-15 LAB — BASIC METABOLIC PANEL
ANION GAP: 9 (ref 5–15)
Anion gap: 8 (ref 5–15)
BUN: 21 mg/dL — ABNORMAL HIGH (ref 6–20)
BUN: 22 mg/dL — ABNORMAL HIGH (ref 6–20)
CALCIUM: 7.6 mg/dL — AB (ref 8.9–10.3)
CHLORIDE: 94 mmol/L — AB (ref 101–111)
CO2: 23 mmol/L (ref 22–32)
CO2: 22 mmol/L (ref 22–32)
CREATININE: 1 mg/dL (ref 0.44–1.00)
Calcium: 7.5 mg/dL — ABNORMAL LOW (ref 8.9–10.3)
Chloride: 98 mmol/L — ABNORMAL LOW (ref 101–111)
Creatinine, Ser: 1.1 mg/dL — ABNORMAL HIGH (ref 0.44–1.00)
GFR calc non Af Amer: 48 mL/min — ABNORMAL LOW (ref >60)
GFR calc non Af Amer: 43 mL/min — ABNORMAL LOW (ref >60)
GFR, EST AFRICAN AMERICAN: 55 mL/min — AB (ref >60)
GFR, EST AFRICAN AMERICAN: 49 mL/min — AB (ref >60)
GLUCOSE: 165 mg/dL — AB (ref 65–99)
Glucose, Bld: 231 mg/dL — ABNORMAL HIGH (ref 65–99)
Potassium: 3.8 mmol/L (ref 3.5–5.1)
Potassium: 3.8 mmol/L (ref 3.5–5.1)
Sodium: 129 mmol/L — ABNORMAL LOW (ref 135–145)
Sodium: 125 mmol/L — ABNORMAL LOW (ref 135–145)

## 2017-03-15 LAB — INFLUENZA PANEL BY PCR (TYPE A & B)
Influenza A By PCR: NEGATIVE
Influenza B By PCR: NEGATIVE

## 2017-03-15 LAB — CBC
HCT: 31.6 % — ABNORMAL LOW (ref 35.0–47.0)
Hemoglobin: 10.4 g/dL — ABNORMAL LOW (ref 12.0–16.0)
MCH: 30 pg (ref 26.0–34.0)
MCHC: 32.9 g/dL (ref 32.0–36.0)
MCV: 90.9 fL (ref 80.0–100.0)
PLATELETS: 91 10*3/uL — AB (ref 150–440)
RBC: 3.48 MIL/uL — ABNORMAL LOW (ref 3.80–5.20)
RDW: 17.2 % — ABNORMAL HIGH (ref 11.5–14.5)
WBC: 8.8 10*3/uL (ref 3.6–11.0)

## 2017-03-15 LAB — TROPONIN I
TROPONIN I: 0.07 ng/mL — AB (ref <0.03)
TROPONIN I: 0.09 ng/mL — AB (ref <0.03)
Troponin I: 0.06 ng/mL (ref <0.03)

## 2017-03-15 LAB — GLUCOSE, CAPILLARY: Glucose-Capillary: 154 mg/dL — ABNORMAL HIGH (ref 65–99)

## 2017-03-15 MED ORDER — POTASSIUM CHLORIDE CRYS ER 20 MEQ PO TBCR
40.0000 meq | EXTENDED_RELEASE_TABLET | Freq: Once | ORAL | Status: AC
Start: 2017-03-15 — End: 2017-03-15
  Administered 2017-03-15: 40 meq via ORAL
  Filled 2017-03-15: qty 2

## 2017-03-15 MED ORDER — ACETAMINOPHEN 325 MG PO TABS
650.0000 mg | ORAL_TABLET | Freq: Four times a day (QID) | ORAL | Status: DC | PRN
  Administered 2017-03-15 (×2): 650 mg via ORAL
  Filled 2017-03-15 (×2): qty 2

## 2017-03-15 MED ORDER — AZITHROMYCIN 250 MG PO TABS: 250.0000 mg | ORAL_TABLET | Freq: Every day | ORAL | Status: DC

## 2017-03-15 MED ORDER — ONDANSETRON HCL 4 MG/2ML IJ SOLN: 4.0000 mg | Freq: Four times a day (QID) | INTRAMUSCULAR | Status: DC | PRN

## 2017-03-15 MED ORDER — ENOXAPARIN SODIUM 40 MG/0.4ML ~~LOC~~ SOLN: 40.0000 mg | SUBCUTANEOUS | Status: DC

## 2017-03-15 MED ORDER — DOXYCYCLINE HYCLATE 100 MG PO TABS
100.0000 mg | ORAL_TABLET | Freq: Two times a day (BID) | ORAL | Status: DC
  Administered 2017-03-15 – 2017-03-16 (×3): 100 mg via ORAL
  Filled 2017-03-15 (×3): qty 1

## 2017-03-15 MED ORDER — ALBUTEROL SULFATE (2.5 MG/3ML) 0.083% IN NEBU: 2.5000 mg | INHALATION_SOLUTION | Freq: Four times a day (QID) | RESPIRATORY_TRACT | Status: DC | PRN

## 2017-03-15 MED ORDER — IPRATROPIUM-ALBUTEROL 0.5-2.5 (3) MG/3ML IN SOLN
3.0000 mL | Freq: Two times a day (BID) | RESPIRATORY_TRACT | Status: DC
  Administered 2017-03-15 – 2017-03-16 (×3): 3 mL via RESPIRATORY_TRACT
  Filled 2017-03-15 (×3): qty 3

## 2017-03-15 MED ORDER — ONDANSETRON HCL 4 MG PO TABS: 4.0000 mg | ORAL_TABLET | Freq: Four times a day (QID) | ORAL | Status: DC | PRN

## 2017-03-15 MED ORDER — ACETAMINOPHEN 650 MG RE SUPP: 650.0000 mg | Freq: Four times a day (QID) | RECTAL | Status: DC | PRN

## 2017-03-15 MED ORDER — ENOXAPARIN SODIUM 30 MG/0.3ML ~~LOC~~ SOLN
30.0000 mg | SUBCUTANEOUS | Status: DC
  Administered 2017-03-16: 30 mg via SUBCUTANEOUS
  Filled 2017-03-15: qty 0.3

## 2017-03-15 NOTE — Consult Note (Signed)
Cardiology Consultation:   Patient ID: Ariana Miller; [id number]; Mar 21, 1923   Admit date: 03/14/2017 Date of Consult: 03/15/2017  Primary Care Provider: Rusty Aus, MD Primary Cardiologist: new to [facility] - consult by Rockey Situ   Patient Profile:   Ariana Miller is a 84 y.o. female with a hx of CAD s/p 2-vessel CABG in 03/2007 with LIMA-LAD and SVG-D1, severe aortic stenosis s/p Carpentier-Edwards AVR at the time of her bypass in 03/2007, pulmonary fibrosis, carotid artery disease s/p left CEA, DM2, bladder cancer s/p TURBT, asthma, iron deficiency anemia, dementia, HLD, HTN, and diverticulitis who is being seen today for the evaluation of SOB at the request of Dr. Jannifer Franklin.  History of Present Illness:   [name] has not regularly seen cardiology since her surgery in 2009.   She lives with her daughter who baby sits little kids. These kids have been sick recently. Kids like to visit with the patient. Patient has been dealing with a subjective fever the past several says with associated cough. No chest pain, palpitations, nausea, vomiting, diaphoresis, dizziness, presyncope, or syncope. No lower extremity swelling, abdominal swelling, orthopnea, or early satiety.   She was seen on 3/10 in the [facility] for cough and fever. Vitals showed a BP of 135/100, HR 120 bpm, temp 100.4, oxygen saturation 98%. CXR at that time was read as not acute. Labs showed influenza negative, WBC 15.3, HGB 12.6, PLT 99, lactic acid 1.8, Na 134, K+ 4.0, glucose 153, BUN 24, SCr 0.78, blood culture no growth to date x 2, UA with moderate HGB. She was felt to have a URI and treated with empiric antibiotics and advised to follow up as an outpatient.   She was seen by Urology on 3/12 for office cystoscopy for surveillance given her history of bladder cancer. She continued to note fever and SOB prompting her to come to the ED for evaluation again on 3/12.   Upon the patient's arrival to [facility] they were found to have BP  132/40 with a peak of 229/96, HR 88 bpm, temp 102.1, oxygen saturation 97% on room air, weight 153 pounds. EKG not acute as below, CXR showed increasing vascular congestion with interstitial edema. Labs showed BNP 608, troponin 0.06-->0.07-->0.09, influenza negative, K+ 2.6-->3.8, Na 127-->129, glucose 223-->165, BUN 21-->21, SCr 1.05-->1.00, WBC 7.2, HGB 10.4, PLT 74-->91. She was given IV Lasix 40 mg x 1 and KCl. Cardiology was asked to evaluate.   Baseline weight around 145-150 pounds.  History obtained from daughter and prior notes given patient's dementia.   Past Medical History:  Diagnosis Date  . Anemia   . Arthritis   . Asthma   . Bladder cancer ([place])   . CAD (coronary artery disease)    a. 2-V CABG 03/2007 (LIMA-LAD, SVG-D1)  . Diabetes mellitus with complication ([place])   . History of gastric ulcer   . HTN (hypertension)   . Pulmonary fibrosis ([place])   . Severe aortic stenosis    a. s/p Carpentier-Edwards AVR 03/2007; b. echo 2009 (pre-AVR): nl LVEF, mild LVH, severe AS, mild MS, trivial AR/MR/TR/PR, nl RVSF    Past Surgical History:  Procedure Laterality Date  . AORTIC VALVE REPLACEMENT (AVR)/CORONARY ARTERY BYPASS GRAFTING (CABG)    . CARDIAC VALVE REPLACEMENT    . CAROTID ENDARTERECTOMY    . CHOLECYSTECTOMY    . CORONARY ARTERY BYPASS GRAFT    . CYSTOSCOPY W/ RETROGRADES Bilateral 12/21/2015   Procedure: CYSTOSCOPY WITH RETROGRADE PYELOGRAM;  Surgeon: Hollice Espy, MD;  Location: [facility] ORS;  Service: Urology;  Laterality: Bilateral;  . EYE SURGERY Right    Catract Extraction with IOL  . TRANSURETHRAL RESECTION OF BLADDER TUMOR WITH MITOMYCIN-C N/A 12/21/2015   Procedure: TRANSURETHRAL RESECTION OF BLADDER TUMOR WITH MITOMYCIN-C;  Surgeon: Hollice Espy, MD;  Location: [facility] ORS;  Service: Urology;  Laterality: N/A;     Home Meds: Prior to Admission medications   Medication Sig Start Date End Date Taking? Authorizing Provider  azithromycin (ZITHROMAX) 250 MG tablet  Take 1 tablet (250 mg total) by mouth daily for 4 days. 03/13/17 03/17/17 Yes Arta Silence, MD  DHA-EPA-Coenzyme Q10-Vitamin E 120-180-50-30 CAPS Take 1 capsule by mouth daily.   Yes [provider]  ferrous sulfate 325 (65 FE) MG tablet Take 325 mg by mouth daily with breakfast.   Yes [provider]  glimepiride (AMARYL) 1 MG tablet Take 0.5 mg by mouth daily with breakfast.   Yes [provider]  Multiple Vitamin (MULTIVITAMIN WITH MINERALS) TABS tablet Take 1 tablet by mouth daily.   Yes [provider]  saccharomyces boulardii (FLORASTOR) 250 MG capsule Take 250 mg by mouth daily.   Yes [provider]  Specialty Vitamins Products (ECHINACEA C COMPLETE PO) Take 1 tablet by mouth daily.   Yes [provider]  TURMERIC PO Take 1 Dose by mouth daily.   Yes [provider]  VENTOLIN HFA 108 (90 Base) MCG/ACT inhaler Inhale 2 puffs into the lungs every 6 (six) hours as needed for wheezing. 03/13/17  Yes [provider]  vitamin B-12 (CYANOCOBALAMIN) 1000 MCG tablet Take 1,000 mcg by mouth daily.    Yes [provider]  vitamin E 400 UNIT capsule Take 400 Units by mouth daily.   Yes [provider]    Inpatient Medications: Scheduled Meds: . doxycycline  100 mg Oral Q12H  . [START ON 03/16/2017] enoxaparin (LOVENOX) injection  30 mg Subcutaneous Q24H   Continuous Infusions:  PRN Meds: acetaminophen **OR** acetaminophen, albuterol, ondansetron **OR** ondansetron (ZOFRAN) IV  Allergies:  No Known Allergies  Social History:   Social History   Socioeconomic History  . Marital status: Single    Spouse name: Not on file  . Number of children: Not on file  . Years of education: Not on file  . Highest education level: Not on file  Social Needs  . Financial resource strain: Not on file  . Food insecurity - worry: Not on file  . Food insecurity - inability: Not on file  . Transportation needs -  medical: Not on file  . Transportation needs - non-medical: Not on file  Occupational History  . Not on file  Tobacco Use  . Smoking status: Former Smoker    Types: Cigarettes  . Smokeless tobacco: Never Used  Substance and Sexual Activity  . Alcohol use: No  . Drug use: No  . Sexual activity: Not on file  Other Topics Concern  . Not on file  Social History Narrative  . Not on file     Family History:   Family History  Problem Relation Age of Onset  . Hepatitis Mother   . Liver disease Father   . Asthma Unknown   . Osteoporosis Unknown   . Hypertension Unknown   . Bladder Cancer Neg Hx   . Kidney cancer Neg Hx     ROS:  Review of Systems  Unable to perform ROS: Dementia      Physical Exam/Data:   Vitals:   03/15/17 0400  03/15/17 0540 03/15/17 0700 03/15/17 0814  BP:    (!) 147/55  Pulse:    96  Resp:      Temp:  (!) 102.1 F (38.9 C) (!) 100.4 F (38 C) 99.2 F (37.3 C)  TempSrc:  Oral Oral Oral  SpO2:    97%  Weight: 143 lb 11.8 oz (65.2 kg)     Height: 5' (1.524 m)       Intake/Output Summary (Last 24 hours) at 03/15/2017 1019 Last data filed at 03/15/2017 0029 Gross per 24 hour  Intake 5 ml  Output -  Net 5 ml   Filed Weights   03/14/17 2046 03/15/17 0400  Weight: 153 lb (69.4 kg) 143 lb 11.8 oz (65.2 kg)   Body mass index is 28.07 kg/m.   Physical Exam: General: Well developed, well nourished, in no acute distress. Head: Normocephalic, atraumatic, sclera non-icteric, no xanthomas, nares without discharge.  Neck: Negative for carotid bruits. JVD not elevated. Lungs: Diminished breath sounds bilaterally with course breath sounds. Breathing is unlabored. Heart: RRR with S1 S2. I/VI systolic murmur RUSB, no rubs, or gallops appreciated. Abdomen: Soft, non-tender, non-distended with normoactive bowel sounds. No hepatomegaly. No rebound/guarding. No obvious abdominal masses. Msk:  Strength and tone appear normal for age. Extremities: No clubbing  or cyanosis. No edema. Distal pedal pulses are 2+ and equal bilaterally. Neuro: Alert. No facial asymmetry. No focal deficit. Moves all extremities spontaneously. Psych:  Responds to questions normal affect.   EKG:  The EKG was personally reviewed and demonstrates: NSR, 87 bpm, RBBB Telemetry:  Telemetry was personally reviewed and demonstrates: NSR  Weights: Filed Weights   03/14/17 2046 03/15/17 0400  Weight: 153 lb (69.4 kg) 143 lb 11.8 oz (65.2 kg)    Relevant CV Studies: Cath 2009: DIAGNOSTIC SUMMARY Coronary Artery Disease Left Main:normal LAD system:significant LCX system:significant RCA system:significant Number of vessels with significant CAD:3 - Vessel Right Heart Catheterization Pulmonary Hypertension:Moderate Valvular Disease Mitral Stenosis: Mild Aortic Stenosis:Severe COMMENT 1. Calcified aortic valve with peak gradient of 30mm Hg. 2. Calcified mitral valve with peak gradient of 78mm Hg. 3. 90%calcifed LAD lesion involving a large diagonal. 4. Medium size OM-1 with a proximal 70% stenosis. 5. Dominant RCA with a mid 70% stenosis.  TTE 2009: INTERPRETATION --------------------------------------------------------------- NORMAL LEFT VENTRICULAR SYSTOLIC FUNCTION WITH MILD LVH NORMAL RIGHT VENTRICULAR SYSTOLIC FUNCTION VALVULAR REGURGITATION: TRIVIAL AR, TRIVIAL MR, TRIVIAL PR, TRIVIAL TR VALVULAR STENOSIS: SEVERE AS, MILD MS  Laboratory Data:  Chemistry Recent Labs  Lab 03/12/17 2146 03/14/17 2225 03/15/17 0847  NA 134* 127* 129*  K 4.0 2.6* 3.8  CL 102 95* 98*  CO2 22 21* 23  GLUCOSE 153* 223* 165*  BUN 24* 21* 21*  CREATININE 0.78 1.05* 1.00  CALCIUM 8.5* 7.8* 7.6*  GFRNONAA >60 45* 48*  GFRAA >60 52* 55*  ANIONGAP 10 11 8     Recent Labs  Lab 03/12/17 2146 03/14/17 2225  PROT 8.7* 7.0  ALBUMIN 3.9 3.2*  AST 39 39  ALT 20 19  ALKPHOS 158* 88  BILITOT 1.0 1.1    Hematology Recent Labs  Lab 03/12/17 2146 03/14/17 2225 03/15/17 0847  WBC 15.3* 7.2 8.8  RBC 4.24 3.46* 3.48*  HGB 12.6 10.4* 10.4*  HCT 38.5 31.5* 31.6*  MCV 90.9 91.1 90.9  MCH 29.8 30.1 30.0  MCHC 32.8 33.0 32.9  RDW 17.1* 17.2* 17.2*  PLT 99* 74* 91*   Cardiac Enzymes Recent Labs  Lab 03/14/17 2225 03/15/17 0415 03/15/17 0847  TROPONINI  0.06* 0.07* 0.09*   No results for input(s): TROPIPOC in the last 168 hours.  BNP Recent Labs  Lab 03/14/17 2225  BNP 608.0*    DDimer No results for input(s): DDIMER in the last 168 hours.  Radiology/Studies:  Dg Chest 2 View  Result Date: 03/14/2017 IMPRESSION: Increasing vascular congestion with interstitial edema Electronically Signed   By: Inez Catalina M.D.   On: 03/14/2017 21:09   Dg Chest Port 1 View  Result Date: 03/12/2017 IMPRESSION: No acute abnormality seen Electronically Signed   By: Inez Catalina M.D.   On: 03/12/2017 22:12    Assessment and Plan:   1. Cough/SOB: -More likely related to infection given her persistent fever -BNP minimally elevated -Echo pending, await results, if normal would not pursue further inpatient cardiac workup and recommend treatment for her underlying infection -Was given IV Lasix 40 mg x 1, would hold off on further doses for now until echo is back  2. Elevated troponin: -Troponin mildly elevated with a current peak of 0.09, continue to cycle until peak -Echo pending -Denies any chest pain  3. CAD s/p CABG as above: -Echo pending as above -If echo shows reduced EF we may need to discuss ischemic evaluation, though given her dementia and comorbid conditions, it is currently unclear if she would be a candidate -ASA -Needs to establish with cardiology as an outpatient -Check lipid panel  4. Severe aortic stenosis s/p AVR: -Echo pending  5. Fever: -Multiple  -Per IM  6. Hypokalemia: -Improved -Check magnesium  7. Pulmonary fibrosis: -Possibly playing a role in her  SOB -Per IM  8. Bladder cancer: -Followed by Urology  9. Thrombocytopenia: -Stable -Per IM  10. Iron deficiency anemia: -Possibly playing a role in her SOB -Daughter notes "dark stools" -Recommend hemoccult testing -Per IM  11. Hyponatremia: -Per IM  12. DM2: -Per IM -Recent A1c of 6.2 in 02/2017  13. Dementia: -Per IM   For questions or updates, please contact [facility] Please consult [email or web address] for contact info under Cardiology/STEMI.   Signed, Christell Faith, PA-C [facility] Pager: [phone] 03/15/2017, 10:19 AM

## 2017-03-15 NOTE — Progress Notes (Signed)
[place] at [facility]                                                                                                                                                                                  Patient Demographics   Ariana Miller, is a 84 y.o. female, DOB - Jun 17, 1923, EXH:[id number]  Admit date - 03/14/2017   Admitting Physician Lance Coon, MD  Outpatient Primary MD for the patient is Rusty Aus, MD   LOS - 0  Subjective: Pt feeling better, and having cough and congestion    Review of Systems:   CONSTITUTIONAL: No documented fever. No fatigue, weakness. No weight gain, no weight loss.  EYES: No blurry or double vision.  ENT: No tinnitus. No postnasal drip. No redness of the oropharynx.  RESPIRATORY: + cough, no wheeze, no hemoptysis. +dyspnea.  CARDIOVASCULAR: No chest pain. No orthopnea. No palpitations. No syncope.  GASTROINTESTINAL: No nausea, no vomiting or diarrhea. No abdominal pain. No melena or hematochezia.  GENITOURINARY: No dysuria or hematuria.  ENDOCRINE: No polyuria or nocturia. No heat or cold intolerance.  HEMATOLOGY: No anemia. No bruising. No bleeding.  INTEGUMENTARY: No rashes. No lesions.  MUSCULOSKELETAL: No arthritis. No swelling. No gout.  NEUROLOGIC: No numbness, tingling, or ataxia. No seizure-type activity.  PSYCHIATRIC: No anxiety. No insomnia. No ADD.    Vitals:   Vitals:   03/15/17 0700 03/15/17 0814 03/15/17 1342 03/15/17 1537  BP:  (!) 147/55  (!) 145/63  Pulse:  96  97  Resp:      Temp: (!) 100.4 F (38 C) 99.2 F (37.3 C)  98.3 F (36.8 C)  TempSrc: Oral Oral  Oral  SpO2:  97% 96% 99%  Weight:      Height:        Wt Readings from Last 3 Encounters:  03/15/17 143 lb 11.8 oz (65.2 kg)  03/12/17 153 lb (69.4 kg)  03/25/16 142 lb 8 oz (64.6 kg)     Intake/Output Summary (Last 24 hours) at 03/15/2017 1753 Last data filed at 03/15/2017 0029 Gross per 24 hour  Intake 5 ml  Output -  Net 5  ml    Physical Exam:   GENERAL: Pleasant-appearing in no apparent distress.  HEAD, EYES, EARS, NOSE AND THROAT: Atraumatic, normocephalic. Extraocular muscles are intact. Pupils equal and reactive to light. Sclerae anicteric. No conjunctival injection. No oro-pharyngeal erythema.  NECK: Supple. There is no jugular venous distention. No bruits, no lymphadenopathy, no thyromegaly.  HEART: Regular rate and rhythm,. No murmurs, no rubs, no clicks.  LUNGS: Clear to auscultation bilaterally. No rales or rhonchi. No wheezes.  ABDOMEN: Soft, flat, nontender, nondistended. Has good  bowel sounds. No hepatosplenomegaly appreciated.  EXTREMITIES: No evidence of any cyanosis, clubbing, or peripheral edema.  +2 pedal and radial pulses bilaterally.  NEUROLOGIC: The patient is alert, awake, and oriented x3 with no focal motor or sensory deficits appreciated bilaterally.  SKIN: Moist and warm with no rashes appreciated.  Psych: Not anxious, depressed LN: No inguinal LN enlargement    Antibiotics   Anti-infectives (From admission, onward)   Start     Dose/Rate Route Frequency Ordered Stop   03/15/17 1000  azithromycin (ZITHROMAX) tablet 250 mg  Status:  Discontinued     250 mg Oral Daily 03/15/17 0256 03/15/17 0318   03/15/17 0330  doxycycline (VIBRA-TABS) tablet 100 mg     100 mg Oral Every 12 hours 03/15/17 0318        Medications   Scheduled Meds: . doxycycline  100 mg Oral Q12H  . [START ON 03/16/2017] enoxaparin (LOVENOX) injection  30 mg Subcutaneous Q24H  . ipratropium-albuterol  3 mL Nebulization BID   Continuous Infusions: PRN Meds:.acetaminophen **OR** acetaminophen, albuterol, ondansetron **OR** ondansetron (ZOFRAN) IV   Data Review:   Micro Results Recent Results (from the past 240 hour(s))  Culture, blood (Routine x 2)     Status: None (Preliminary result)   Collection Time: 03/12/17  9:46 PM  Result Value Ref Range Status   Specimen Description BLOOD RIGHT HAND  Final    Special Requests   Final    BOTTLES DRAWN AEROBIC AND ANAEROBIC Blood Culture adequate volume   Culture   Final    NO GROWTH 3 DAYS Performed at [facility], [address] [place], [place] [address]    Report Status PENDING  Incomplete  Culture, blood (Routine x 2)     Status: None (Preliminary result)   Collection Time: 03/12/17 10:22 PM  Result Value Ref Range Status   Specimen Description BLOOD BLOOD LEFT ARM  Final   Special Requests   Final    BOTTLES DRAWN AEROBIC AND ANAEROBIC Blood Culture adequate volume   Culture   Final    NO GROWTH 3 DAYS Performed at [facility], [address] [place], [place] [address]    Report Status PENDING  Incomplete  Microscopic Examination     Status: Abnormal   Collection Time: 03/14/17 10:03 AM  Result Value Ref Range Status   WBC, UA None seen 0 - 5 /hpf Final   RBC, UA 3-10 (A) 0 - 2 /hpf Final   Epithelial Cells (non renal) 0-10 0 - 10 /hpf Final   Mucus, UA Present (A) Not Estab. Final   Bacteria, UA Few (A) None seen/Few Final    Radiology Reports Dg Chest 2 View  Result Date: 03/14/2017 CLINICAL DATA:  Shortness of breath and fevers EXAM: CHEST - 2 VIEW COMPARISON:  03/12/2017 FINDINGS: Cardiac shadow is stable. Increasing vascular congestion is noted with some interstitial edema new from the prior exam. Mild bibasilar atelectatic changes are seen. Chronic changes in the shoulders are noted bilaterally. Postsurgical changes are again noted. IMPRESSION: Increasing vascular congestion with interstitial edema Electronically Signed   By: Inez Catalina M.D.   On: 03/14/2017 21:09   Dg Chest Port 1 View  Result Date: 03/12/2017 CLINICAL DATA:  Shortness of breath and cough EXAM: PORTABLE CHEST 1 VIEW COMPARISON:  09/20/2015 FINDINGS: Cardiac shadow is within normal limits. Postoperative changes are again seen. The lungs are well aerated bilaterally with mild interstitial changes stable from the prior exam. No focal  infiltrate or  sizable effusion is seen. No bony abnormality is noted. IMPRESSION: No acute abnormality seen Electronically Signed   By: Inez Catalina M.D.   On: 03/12/2017 22:12     CBC Recent Labs  Lab 03/12/17 2146 03/14/17 2225 03/15/17 0847  WBC 15.3* 7.2 8.8  HGB 12.6 10.4* 10.4*  HCT 38.5 31.5* 31.6*  PLT 99* 74* 91*  MCV 90.9 91.1 90.9  MCH 29.8 30.1 30.0  MCHC 32.8 33.0 32.9  RDW 17.1* 17.2* 17.2*  LYMPHSABS 1.2  --   --   MONOABS 1.6*  --   --   EOSABS 0.1  --   --   BASOSABS 0.1  --   --     Chemistries  Recent Labs  Lab 03/12/17 2146 03/14/17 2225 03/15/17 0847 03/15/17 1013  NA 134* 127* 129* 125*  K 4.0 2.6* 3.8 3.8  CL 102 95* 98* 94*  CO2 22 21* 23 22  GLUCOSE 153* 223* 165* 231*  BUN 24* 21* 21* 22*  CREATININE 0.78 1.05* 1.00 1.10*  CALCIUM 8.5* 7.8* 7.6* 7.5*  AST 39 39  --   --   ALT 20 19  --   --   ALKPHOS 158* 88  --   --   BILITOT 1.0 1.1  --   --    ------------------------------------------------------------------------------------------------------------------ estimated creatinine clearance is 28.1 mL/min (A) (by C-G formula based on SCr of 1.1 mg/dL (H)). ------------------------------------------------------------------------------------------------------------------ No results for input(s): HGBA1C in the last 72 hours. ------------------------------------------------------------------------------------------------------------------ No results for input(s): CHOL, HDL, LDLCALC, TRIG, CHOLHDL, LDLDIRECT in the last 72 hours. ------------------------------------------------------------------------------------------------------------------ No results for input(s): TSH, T4TOTAL, T3FREE, THYROIDAB in the last 72 hours.  Invalid input(s): FREET3 ------------------------------------------------------------------------------------------------------------------ No results for input(s): VITAMINB12, FOLATE, FERRITIN, TIBC, IRON, RETICCTPCT in the  last 72 hours.  Coagulation profile Recent Labs  Lab 03/12/17 2222  INR 1.05    No results for input(s): DDIMER in the last 72 hours.  Cardiac Enzymes Recent Labs  Lab 03/15/17 0415 03/15/17 0847 03/15/17 1448  TROPONINI 0.07* 0.09* 0.06*   ------------------------------------------------------------------------------------------------------------------ Invalid input(s): POCBNP    Assessment & Plan   #1 dyspnea - Possibly related to viral infection continue supportive care I have checked the viral panel #2  Hypokalemia -potassium replaced, will continue to monitor and replace as necessary #3 hyponatremia unclear cause possibly related to Lasix I will recheck in the morning  #4 Reactive airway disease that is not asthma -home dose inhalers  #5 Diabetes mellitus type 2, controlled, without complications (HCC) -siding scale insulin with corresponding glucose checks  #6 CAD (coronary artery disease) -continue home meds  #7 Benign essential hypertension -continue to monitor blood  #8   Hyperlipidemia, mixed -home dose antilipid       Code Status Orders  (From admission, onward)        Start     Ordered   03/15/17 0257  Do not attempt resuscitation (DNR)  Continuous    Question Answer Comment  In the event of cardiac or respiratory ARREST Do not call a "code blue"   In the event of cardiac or respiratory ARREST Do not perform Intubation, CPR, defibrillation or ACLS   In the event of cardiac or respiratory ARREST Use medication by any route, position, wound care, and other measures to relive pain and suffering. May use oxygen, suction and manual treatment of airway obstruction as needed for comfort.   Comments RN may pronounce      03/15/17 0256    Code Status History  Date Active Date Inactive Code Status Order ID Comments User Context   10/19/2015 11:22 10/20/2015 17:51 DNR [id number]  Nicholes Mango, MD Inpatient   10/19/2015 10:14 10/19/2015 11:21 Full Code  [id number]  Harvie Bridge, DO Inpatient   03/03/2015 01:27 03/05/2015 17:02 Full Code [id number]  Lance Coon, MD Inpatient           Consults cardiology   DVT Prophylaxis  Lovenox  Lab Results  Component Value Date   PLT 91 (L) 03/15/2017     Time Spent in minutes   35 minutes  Greater than 50% of time spent in care coordination and counseling patient regarding the condition and plan of care.   Dustin Flock M.D on 03/15/2017 at 5:53 PM  Between 7am to 6pm - Pager - [phone]  After 6pm go to [email or web address] - Proofreader  [employer]   Office  [phone]

## 2017-03-15 NOTE — H&P (Signed)
[facility] [place] NAME: Ariana Miller    MR#:  [id number]  DATE OF BIRTH:  [birth year]  DATE OF ADMISSION:  03/14/2017  PRIMARY CARE PHYSICIAN: Rusty Aus, MD   REQUESTING/REFERRING PHYSICIAN: Kerman Passey, MD  CHIEF COMPLAINT:   Chief Complaint  Patient presents with  . Shortness of Breath    HISTORY OF PRESENT ILLNESS:  Ariana Miller  is a 84 y.o. female who presents with progressive dyspnea.  Patient was seen here in the ED recently and diagnosed with bronchitis, however her symptoms are not improving.  She states that for the past 3-4 days she has had this shortness of breath and some cough.  Workup here in the ED today is consistent with heart failure, the patient does not have a prior history of heart failure.  She does have a history of CAD and CABG.  Hospitalist called for admission.  PAST MEDICAL HISTORY:   Past Medical History:  Diagnosis Date  . Anemia   . Arthritis   . Asthma   . CAD (coronary artery disease)   . Diabetes mellitus without complication ([place])   . H/O aortic valve disorder   . History of gastric ulcer   . HTN (hypertension)      PAST SURGICAL HISTORY:   Past Surgical History:  Procedure Laterality Date  . AORTIC VALVE REPLACEMENT (AVR)/CORONARY ARTERY BYPASS GRAFTING (CABG)    . CARDIAC VALVE REPLACEMENT    . CAROTID ENDARTERECTOMY    . CHOLECYSTECTOMY    . CORONARY ARTERY BYPASS GRAFT    . CYSTOSCOPY W/ RETROGRADES Bilateral 12/21/2015   Procedure: CYSTOSCOPY WITH RETROGRADE PYELOGRAM;  Surgeon: Hollice Espy, MD;  Location: [facility] ORS;  Service: Urology;  Laterality: Bilateral;  . EYE SURGERY Right    Catract Extraction with IOL  . TRANSURETHRAL RESECTION OF BLADDER TUMOR WITH MITOMYCIN-C N/A 12/21/2015   Procedure: TRANSURETHRAL RESECTION OF BLADDER TUMOR WITH MITOMYCIN-C;  Surgeon: Hollice Espy, MD;  Location: [facility] ORS;  Service: Urology;  Laterality: N/A;     SOCIAL HISTORY:    Social History   Tobacco Use  . Smoking status: Former Smoker    Types: Cigarettes  . Smokeless tobacco: Never Used  Substance Use Topics  . Alcohol use: No     FAMILY HISTORY:   Family History  Problem Relation Age of Onset  . Hepatitis Mother   . Liver disease Father   . Asthma Unknown   . Osteoporosis Unknown   . Hypertension Unknown   . Bladder Cancer Neg Hx   . Kidney cancer Neg Hx      DRUG ALLERGIES:  No Known Allergies  MEDICATIONS AT HOME:   Prior to Admission medications   Medication Sig Start Date End Date Taking? Authorizing Provider  azithromycin (ZITHROMAX) 250 MG tablet Take 1 tablet (250 mg total) by mouth daily for 4 days. 03/13/17 03/17/17 Yes Arta Silence, MD  DHA-EPA-Coenzyme Q10-Vitamin E 120-180-50-30 CAPS Take 1 capsule by mouth daily.   Yes [provider]  ferrous sulfate 325 (65 FE) MG tablet Take 325 mg by mouth daily with breakfast.   Yes [provider]  glimepiride (AMARYL) 1 MG tablet Take 0.5 mg by mouth daily with breakfast.   Yes [provider]  Multiple Vitamin (MULTIVITAMIN WITH MINERALS) TABS tablet Take 1 tablet by mouth daily.   Yes [provider]  saccharomyces boulardii (FLORASTOR) 250 MG capsule Take 250 mg by mouth daily.   Yes [provider]  Specialty Vitamins Products (ECHINACEA C COMPLETE PO) Take 1 tablet by mouth daily.   Yes [provider]  TURMERIC PO Take 1 Dose by mouth daily.   Yes [provider]  VENTOLIN HFA 108 (90 Base) MCG/ACT inhaler Inhale 2 puffs into the lungs every 6 (six) hours as needed for wheezing. 03/13/17  Yes [provider]  vitamin B-12 (CYANOCOBALAMIN) 1000 MCG tablet Take 1,000 mcg by mouth daily.    Yes [provider]  vitamin E 400 UNIT capsule Take 400 Units by mouth daily.   Yes [provider]    REVIEW OF SYSTEMS:  Review of Systems  Constitutional: Negative for chills, fever,  malaise/fatigue and weight loss.  HENT: Negative for ear pain, hearing loss and tinnitus.   Eyes: Negative for blurred vision, double vision, pain and redness.  Respiratory: Positive for cough and shortness of breath. Negative for hemoptysis.   Cardiovascular: Positive for leg swelling. Negative for chest pain, palpitations and orthopnea.  Gastrointestinal: Negative for abdominal pain, constipation, diarrhea, nausea and vomiting.  Genitourinary: Negative for dysuria, frequency and hematuria.  Musculoskeletal: Negative for back pain, joint pain and neck pain.  Skin:       No acne, rash, or lesions  Neurological: Negative for dizziness, tremors, focal weakness and weakness.  Endo/Heme/Allergies: Negative for polydipsia. Does not bruise/bleed easily.  Psychiatric/Behavioral: Negative for depression. The patient is not nervous/anxious and does not have insomnia.      VITAL SIGNS:   Vitals:   03/14/17 2228 03/14/17 2229 03/14/17 2230 03/14/17 2231  BP:   (!) 139/53   Pulse: 93 95 95 92  Resp:   16   Temp:      TempSrc:      SpO2: 96% 97% 96% 97%  Weight:       Wt Readings from Last 3 Encounters:  03/14/17 69.4 kg (153 lb)  03/12/17 69.4 kg (153 lb)  03/25/16 64.6 kg (142 lb 8 oz)    PHYSICAL EXAMINATION:  Physical Exam  Vitals reviewed. Constitutional: She is oriented to person, place, and time. She appears well-developed and well-nourished. No distress.  HENT:  Head: Normocephalic and atraumatic.  Mouth/Throat: Oropharynx is clear and moist.  Eyes: Conjunctivae and EOM are normal. Pupils are equal, round, and reactive to light. No scleral icterus.  Neck: Normal range of motion. Neck supple. No JVD present. No thyromegaly present.  Cardiovascular: Normal rate, regular rhythm and intact distal pulses. Exam reveals no gallop and no friction rub.  No murmur heard. Respiratory: Effort normal. No respiratory distress. She has no wheezes. She has rales.  GI: Soft. Bowel sounds are  normal. She exhibits no distension. There is no tenderness.  Musculoskeletal: Normal range of motion. She exhibits edema ([place]).  No arthritis, no gout  Lymphadenopathy:    She has no cervical adenopathy.  Neurological: She is alert and oriented to person, place, and time. No cranial nerve deficit.  No dysarthria, no aphasia  Skin: Skin is warm and dry. No rash noted. No erythema.  Psychiatric: She has a normal mood and affect. Her behavior is normal. Judgment and thought content normal.    LABORATORY PANEL:   CBC Recent Labs  Lab 03/14/17 2225  WBC 7.2  HGB 10.4*  HCT 31.5*  PLT 74*   ------------------------------------------------------------------------------------------------------------------  Chemistries  Recent Labs  Lab 03/14/17 2225  NA 127*  K 2.6*  CL 95*  CO2 21*  GLUCOSE 223*  BUN 21*  CREATININE 1.05*  CALCIUM 7.8*  AST 39  ALT 19  ALKPHOS 88  BILITOT 1.1   ------------------------------------------------------------------------------------------------------------------  Cardiac Enzymes Recent Labs  Lab 03/14/17 2225  TROPONINI 0.06*   ------------------------------------------------------------------------------------------------------------------  RADIOLOGY:  Dg Chest 2 View  Result Date: 03/14/2017 CLINICAL DATA:  Shortness of breath and fevers EXAM: CHEST - 2 VIEW COMPARISON:  03/12/2017 FINDINGS: Cardiac shadow is stable. Increasing vascular congestion is noted with some interstitial edema new from the prior exam. Mild bibasilar atelectatic changes are seen. Chronic changes in the shoulders are noted bilaterally. Postsurgical changes are again noted. IMPRESSION: Increasing vascular congestion with interstitial edema Electronically Signed   By: Inez Catalina M.D.   On: 03/14/2017 21:09    EKG:   Orders placed or performed during the hospital encounter of 03/14/17  . ED EKG  . ED EKG  . EKG 12-Lead  . EKG 12-Lead    IMPRESSION AND  PLAN:  Principal Problem:   Dyspnea -drawn suspicion that this is heart failure.  Her x-ray showed some edema, her BNP was elevated.  She was given IV Lasix in the ED.  We will trend her cardiac enzymes tonight, get an echocardiogram and a cardiology consult Active Problems:   Hypokalemia -potassium replaced, will continue to monitor and replace as necessary   Reactive airway disease that is not asthma -home dose inhalers   Diabetes mellitus type 2, controlled, without complications (HCC) -siding scale insulin with corresponding glucose checks   CAD (coronary artery disease) -continue home meds   Benign essential hypertension -she states she is no longer on any medications for this as her blood pressure is been controlled, we will monitor closely and use antihypertensives if necessary, blood pressure goal less than 160/100   Hyperlipidemia, mixed -home dose antilipid  Chart review performed and case discussed with ED provider. Labs, imaging and/or ECG reviewed by provider and discussed with patient/family. Management plans discussed with the patient and/or family.  DVT PROPHYLAXIS: SubQ lovenox  GI PROPHYLAXIS: None  ADMISSION STATUS: Observation  CODE STATUS: DNR Code Status History    Date Active Date Inactive Code Status Order ID Comments User Context   10/19/2015 11:22 10/20/2015 17:51 DNR [id number]  Nicholes Mango, MD Inpatient   10/19/2015 10:14 10/19/2015 11:21 Full Code [id number]  Harvie Bridge, DO Inpatient   03/03/2015 01:27 03/05/2015 17:02 Full Code [id number]  Lance Coon, MD Inpatient    Questions for Most Recent Historical Code Status (Order [id number])    Question Answer Comment   In the event of cardiac or respiratory ARREST Do not call a "code blue"    In the event of cardiac or respiratory ARREST Do not perform Intubation, CPR, defibrillation or ACLS    In the event of cardiac or respiratory ARREST Use medication by any route, position, wound care, and other  measures to relive pain and suffering. May use oxygen, suction and manual treatment of airway obstruction as needed for comfort.    Comments RN may pronounce       TOTAL TIME TAKING CARE OF THIS PATIENT: 40 minutes.   Anna-Marie Coller [place] 03/15/2017, 1:14 AM  [employer]  [phone]  CC: Primary care physician; Rusty Aus, MD  Note:  This document was prepared using Dragon voice recognition software and may include unintentional dictation errors.

## 2017-03-15 NOTE — Progress Notes (Signed)
*  PRELIMINARY RESULTS* Echocardiogram 2D Echocardiogram has been performed.  Ariana Miller 03/15/2017, 10:09 AM

## 2017-03-15 NOTE — Progress Notes (Signed)
Patient admitted to room 257. Attempted to get admission weight, however daughter refused stating "she is tired, getting a weight is not important right now". Also, patient BP elevated due to tremors in arm. Daughter requesting to see MD. Attempted to educated daughter, however she is unwilling to listen at this time. MD notified. MD to come to bedside when available.   Admission and assessment completed using interpretor, [place]. All questions answered. Will continue to monitor.   Ariana Miller

## 2017-03-15 NOTE — Progress Notes (Signed)
Inpatient Diabetes Program Recommendations  AACE/ADA: New Consensus Statement on Inpatient Glycemic Control (2015)  Target Ranges:  Prepandial:   less than 140 mg/dL      Peak postprandial:   less than 180 mg/dL (1-2 hours)      Critically ill patients:  140 - 180 mg/dL  Results for Ariana Miller, Ariana Miller (MRN [id number]) as of 03/15/2017 10:33  Ref. Range 03/15/2017 03:56  Glucose-Capillary Latest Ref Range: 65 - 99 mg/dL 154 (H)   Results for Ariana Miller, Ariana Miller (MRN [id number]) as of 03/15/2017 10:33  Ref. Range 03/12/2017 21:46 03/14/2017 22:25 03/15/2017 08:47  Glucose Latest Ref Range: 65 - 99 mg/dL 153 (H) 223 (H) 165 (H)   Review of Glycemic Control  Diabetes history: DM2 Outpatient Diabetes medications: Amaryl 0.5 mg daily Current orders for Inpatient glycemic control: None  Inpatient Diabetes Program Recommendations: Correction ([facility]I): Please consider ordering CBGs with Novolog 0-9 units TID with meals and Novolog 0-5 units QHS.  Thanks, Barnie Alderman, RN, MSN, CDE Diabetes Coordinator Inpatient Diabetes Program [phone] (Team Pager from 8am to 5pm)

## 2017-03-15 NOTE — Care Management Obs Status (Signed)
[facility] NOTIFICATION   Patient Details  Name: Ariana Miller MRN: [id number] Date of Birth: 12/01/1923   Medicare Observation Status Notification Given:  Yes    Katrina Stack, RN 03/15/2017, 6:39 PM

## 2017-03-15 NOTE — Progress Notes (Signed)
Lovenox dose adjustment from 40 mg subq daily to 30 mg subq daily for CrCl < 30 ml/min  Tobie Lords, PharmD, BCPS Clinical Pharmacist 03/15/2017

## 2017-03-16 DIAGNOSIS — J205 Acute bronchitis due to respiratory syncytial virus: Secondary | ICD-10-CM | POA: Diagnosis not present

## 2017-03-16 LAB — RESPIRATORY PANEL BY PCR
Adenovirus: NOT DETECTED
Bordetella pertussis: NOT DETECTED
CORONAVIRUS OC43-RVPPCR: NOT DETECTED
Chlamydophila pneumoniae: NOT DETECTED
Coronavirus 229E: NOT DETECTED
Coronavirus HKU1: NOT DETECTED
Coronavirus NL63: NOT DETECTED
INFLUENZA A-RVPPCR: NOT DETECTED
INFLUENZA B-RVPPCR: NOT DETECTED
METAPNEUMOVIRUS-RVPPCR: NOT DETECTED
Mycoplasma pneumoniae: NOT DETECTED
PARAINFLUENZA VIRUS 1-RVPPCR: NOT DETECTED
PARAINFLUENZA VIRUS 2-RVPPCR: NOT DETECTED
PARAINFLUENZA VIRUS 3-RVPPCR: NOT DETECTED
PARAINFLUENZA VIRUS 4-RVPPCR: NOT DETECTED
RHINOVIRUS / ENTEROVIRUS - RVPPCR: NOT DETECTED
Respiratory Syncytial Virus: DETECTED — AB

## 2017-03-16 LAB — BASIC METABOLIC PANEL
ANION GAP: 8 (ref 5–15)
BUN: 20 mg/dL (ref 6–20)
CHLORIDE: 102 mmol/L (ref 101–111)
CO2: 25 mmol/L (ref 22–32)
Calcium: 8.1 mg/dL — ABNORMAL LOW (ref 8.9–10.3)
Creatinine, Ser: 0.87 mg/dL (ref 0.44–1.00)
GFR calc non Af Amer: 57 mL/min — ABNORMAL LOW (ref >60)
Glucose, Bld: 125 mg/dL — ABNORMAL HIGH (ref 65–99)
Potassium: 3.4 mmol/L — ABNORMAL LOW (ref 3.5–5.1)
Sodium: 135 mmol/L (ref 135–145)

## 2017-03-16 MED ORDER — GUAIFENESIN-DM 100-10 MG/5ML PO SYRP: 5.0000 mL | ORAL_SOLUTION | ORAL | 0 refills | Status: DC | PRN

## 2017-03-16 MED ORDER — ENOXAPARIN SODIUM 40 MG/0.4ML ~~LOC~~ SOLN: 40.0000 mg | SUBCUTANEOUS | Status: DC

## 2017-03-16 MED ORDER — BENZONATATE 100 MG PO CAPS: 100.0000 mg | ORAL_CAPSULE | Freq: Three times a day (TID) | ORAL | 0 refills | Status: DC | PRN

## 2017-03-16 NOTE — Discharge Instructions (Signed)
[facility] [place] [facility] ° °DIET:  °Cardiac diet, diabetic diet ° °DISCHARGE CONDITION:  °Stable ° °ACTIVITY:  °Activity as tolerated ° °OXYGEN:  °Home Oxygen: No. °  °Oxygen Delivery: room air ° °DISCHARGE LOCATION:  °home  ° ° °ADDITIONAL DISCHARGE INSTRUCTION: ° ° °If you experience worsening of your admission symptoms, develop shortness of breath, life threatening emergency, suicidal or homicidal thoughts you must seek medical attention immediately by calling 911 or calling your MD immediately  if symptoms less severe. ° °You Must read complete instructions/literature along with all the possible adverse reactions/side effects for all the Medicines you take and that have been prescribed to you. Take any new Medicines after you have completely understood and accpet all the possible adverse reactions/side effects.  ° °Please note ° °You were cared for by a hospitalist during your hospital stay. If you have any questions about your discharge medications or the care you received while you were in the hospital after you are discharged, you can call the unit and asked to speak with the hospitalist on call if the hospitalist that took care of you is not available. Once you are discharged, your primary care physician will handle any further medical issues. Please note that NO REFILLS for any discharge medications will be authorized once you are discharged, as it is imperative that you return to your primary care physician (or establish a relationship with a primary care physician if you do not have one) for your aftercare needs so that they can reassess your need for medications and monitor your lab values. ° ° °

## 2017-03-16 NOTE — Progress Notes (Signed)
PHARMACIST - PHYSICIAN COMMUNICATION  CONCERNING:  Enoxaparin (Lovenox) for DVT Prophylaxis   Filed Weights   03/14/17 2046 03/15/17 0400 03/16/17 0330  Weight: 153 lb (69.4 kg) 143 lb 11.8 oz (65.2 kg) 139 lb 9.6 oz (63.3 kg)    Body mass index is 27.26 kg/m.  Estimated Creatinine Clearance: 35 mL/min (by C-G formula based on SCr of 0.87 mg/dL).  DESCRIPTION: Pharmacy has adjusted enoxaparin dose per [facility] policy.  CrCl now >75ml/min. Lovenox dose adjustment back to  40 mg subq daily.  Pernell Dupre, PharmD, BCPS Clinical Pharmacist 03/16/2017 11:27 AM

## 2017-03-16 NOTE — Progress Notes (Signed)
IV and tele removed from patient. Discharge instructions given to patient and daughter. Verbalized understanding. No acute distress at this time. Daughter at bedside and will transport patient home.

## 2017-03-16 NOTE — Discharge Summary (Signed)
Ariana Miller, [place] y.o., DOB 1923-03-17, MRN [id number]. Admission date: 03/14/2017 Discharge Date 03/16/2017 Primary MD Rusty Aus, MD Admitting Physician Lance Coon, MD  Admission Diagnosis  Hypokalemia [E87.6] Hyponatremia [E87.1] Acute pulmonary edema (HCC) [J81.0] SOB (shortness of breath) [R06.02]  Discharge Diagnosis   Principal Problem: Acute RSV CHF has been ruled out Hypokalemia Hyponatremia of unclear etiology dm2 Cad htn hyperlipdemia    Hospital Course  Ariana Miller  is a 84 y.o. female who presents with progressive dyspnea.  Patient was seen here in the ED recently and diagnosed with bronchitis, however her symptoms are not improving.  She states that for the past 3-4 days she has had this shortness of breath and some cough.  Workup here in the ED today is consistent with heart failure, the patient does not have a prior history of heart failure.  She does have a history of CAD and CABG.  Hospitalist called for admission.  Patient was noted to have cough congestion and fever further evaluation showed her to have positive for RSV.  There was concern that she may have CHF however cardiology felt that this was not CHF.  Her echo showed no significant systolic or diastolic dysfunction.  Patient still has some cough congestion but doing better and is stable for discharge.  Patient was noted to have severe hyponatremia however repeat blood work here shows normal sodium today.  So it is unclear why her sodium was low yesterday.           Consults  cardiology  Significant Tests:  See full reports for all details     Dg Chest 2 View  Result Date: 03/14/2017 CLINICAL DATA:  Shortness of breath and fevers EXAM: CHEST - 2 VIEW COMPARISON:  03/12/2017 FINDINGS: Cardiac shadow is stable. Increasing vascular congestion is noted with some interstitial edema new from the prior exam. Mild bibasilar atelectatic changes are  seen. Chronic changes in the shoulders are noted bilaterally. Postsurgical changes are again noted. IMPRESSION: Increasing vascular congestion with interstitial edema Electronically Signed   By: Inez Catalina M.D.   On: 03/14/2017 21:09   Dg Chest Port 1 View  Result Date: 03/12/2017 CLINICAL DATA:  Shortness of breath and cough EXAM: PORTABLE CHEST 1 VIEW COMPARISON:  09/20/2015 FINDINGS: Cardiac shadow is within normal limits. Postoperative changes are again seen. The lungs are well aerated bilaterally with mild interstitial changes stable from the prior exam. No focal infiltrate or sizable effusion is seen. No bony abnormality is noted. IMPRESSION: No acute abnormality seen Electronically Signed   By: Inez Catalina M.D.   On: 03/12/2017 22:12       Today   Subjective:   Ariana Miller patient continues to have cough and congestion but no other symptoms Objective:   Blood pressure (!) 144/64, pulse 89, temperature 98.3 F (36.8 C), temperature source Oral, resp. rate 20, height 5' (1.524 m), weight 139 lb 9.6 oz (63.3 kg), SpO2 99 %.  .  Intake/Output Summary (Last 24 hours) at 03/16/2017 1559 Last data filed at 03/15/2017 1936 Gross per 24 hour  Intake -  Output 1200 ml  Net -1200 ml    Exam VITAL SIGNS: Blood pressure (!) 144/64, pulse 89, temperature 98.3 F (36.8 C), temperature source Oral, resp. rate 20, height 5' (1.524 m), weight 139 lb 9.6 oz (63.3 kg), SpO2 99 %.  GENERAL:  84 y.o.-year-old patient lying in the bed with no acute distress.  EYES: Pupils equal, round, reactive to light and accommodation. No scleral icterus. Extraocular muscles intact.  HEENT: Head atraumatic, normocephalic. Oropharynx and nasopharynx clear.  NECK:  Supple, no jugular venous distention. No thyroid enlargement, no tenderness.  LUNGS: Normal breath sounds bilaterally, no wheezing, rales,rhonchi or crepitation. No use of accessory muscles of respiration.  CARDIOVASCULAR: S1, S2 normal. No  murmurs, rubs, or gallops.  ABDOMEN: Soft, nontender, nondistended. Bowel sounds present. No organomegaly or mass.  EXTREMITIES: No pedal edema, cyanosis, or clubbing.  NEUROLOGIC: Cranial nerves II through XII are intact. Muscle strength 5/5 in all extremities. Sensation intact. Gait not checked.  PSYCHIATRIC: The patient is alert and oriented x 3.  SKIN: No obvious rash, lesion, or ulcer.   Data Review     CBC w Diff:  Lab Results  Component Value Date   WBC 8.8 03/15/2017   HGB 10.4 (L) 03/15/2017   HGB 10.9 (L) 01/05/2012   HCT 31.6 (L) 03/15/2017   HCT 32.8 (L) 01/05/2012   PLT 91 (L) 03/15/2017   PLT 101 (L) 01/05/2012   LYMPHOPCT 8 03/12/2017   MONOPCT 11 03/12/2017   EOSPCT 1 03/12/2017   BASOPCT 1 03/12/2017   CMP:  Lab Results  Component Value Date   NA 135 03/16/2017   NA 135 (L) 01/05/2012   K 3.4 (L) 03/16/2017   K 3.8 01/05/2012   CL 102 03/16/2017   CL 100 01/05/2012   CO2 25 03/16/2017   CO2 25 01/05/2012   BUN 20 03/16/2017   BUN 20 (H) 01/05/2012   CREATININE 0.87 03/16/2017   CREATININE 1.23 01/05/2012   PROT 7.0 03/14/2017   PROT 7.3 01/05/2012   ALBUMIN 3.2 (L) 03/14/2017   ALBUMIN 3.3 (L) 01/05/2012   BILITOT 1.1 03/14/2017   BILITOT 0.5 01/05/2012   ALKPHOS 88 03/14/2017   ALKPHOS 139 (H) 01/05/2012   AST 39 03/14/2017   AST 21 01/05/2012   ALT 19 03/14/2017   ALT 16 01/05/2012  .  Micro Results Recent Results (from the past 240 hour(s))  Culture, blood (Routine x 2)     Status: None (Preliminary result)   Collection Time: 03/12/17  9:46 PM  Result Value Ref Range Status   Specimen Description BLOOD RIGHT HAND  Final   Special Requests   Final    BOTTLES DRAWN AEROBIC AND ANAEROBIC Blood Culture adequate volume   Culture   Final    NO GROWTH 4 DAYS Performed at [facility], [address], [place], [place] [address]    Report Status PENDING  Incomplete  Culture, blood (Routine x 2)     Status: None (Preliminary  result)   Collection Time: 03/12/17 10:22 PM  Result Value Ref Range Status   Specimen Description BLOOD BLOOD LEFT ARM  Final   Special Requests   Final    BOTTLES DRAWN AEROBIC AND ANAEROBIC Blood Culture adequate volume   Culture   Final    NO GROWTH 4 DAYS Performed at [facility], [address] [place], [place] [address]    Report Status PENDING  Incomplete  Microscopic Examination     Status: Abnormal   Collection Time: 03/14/17 10:03 AM  Result Value Ref Range Status   WBC, UA None seen 0 - 5 /hpf Final   RBC, UA 3-10 (A) 0 - 2 /hpf Final   Epithelial Cells (non renal) 0-10 0 - 10 /hpf Final   Mucus, UA Present (A) Not Estab. Final   Bacteria, UA Few (  A) None seen/Few Final  Respiratory Panel by PCR     Status: Abnormal   Collection Time: 03/15/17  1:28 PM  Result Value Ref Range Status   Adenovirus NOT DETECTED NOT DETECTED Final   Coronavirus 229E NOT DETECTED NOT DETECTED Final   Coronavirus HKU1 NOT DETECTED NOT DETECTED Final   Coronavirus NL63 NOT DETECTED NOT DETECTED Final   Coronavirus OC43 NOT DETECTED NOT DETECTED Final   Metapneumovirus NOT DETECTED NOT DETECTED Final   Rhinovirus / Enterovirus NOT DETECTED NOT DETECTED Final   Influenza A NOT DETECTED NOT DETECTED Final   Influenza B NOT DETECTED NOT DETECTED Final   Parainfluenza Virus 1 NOT DETECTED NOT DETECTED Final   Parainfluenza Virus 2 NOT DETECTED NOT DETECTED Final   Parainfluenza Virus 3 NOT DETECTED NOT DETECTED Final   Parainfluenza Virus 4 NOT DETECTED NOT DETECTED Final   Respiratory Syncytial Virus DETECTED (A) NOT DETECTED Final    Comment: CRITICAL RESULT CALLED TO, READ BACK BY AND VERIFIED WITH: K MURRAY RN 03/16/17 0716 JDW    Bordetella pertussis NOT DETECTED NOT DETECTED Final   Chlamydophila pneumoniae NOT DETECTED NOT DETECTED Final   Mycoplasma pneumoniae NOT DETECTED NOT DETECTED Final    Comment: Performed at [facility], [address], [place], [place]  [address]        Code Status Orders  (From admission, onward)        Start     Ordered   03/15/17 0257  Do not attempt resuscitation (DNR)  Continuous    Question Answer Comment  In the event of cardiac or respiratory ARREST Do not call a "code blue"   In the event of cardiac or respiratory ARREST Do not perform Intubation, CPR, defibrillation or ACLS   In the event of cardiac or respiratory ARREST Use medication by any route, position, wound care, and other measures to relive pain and suffering. May use oxygen, suction and manual treatment of airway obstruction as needed for comfort.   Comments RN may pronounce      03/15/17 0256    Code Status History    Date Active Date Inactive Code Status Order ID Comments User Context   10/19/2015 11:22 10/20/2015 17:51 DNR [id number]  Nicholes Mango, MD Inpatient   10/19/2015 10:14 10/19/2015 11:21 Full Code [id number]  Harvie Bridge, DO Inpatient   03/03/2015 01:27 03/05/2015 17:02 Full Code [id number]  Lance Coon, MD Inpatient          Follow-up Information    Rusty Aus, MD. Go on 03/23/2017.   Specialty:  Internal Medicine Why:  @10 :45AM Contact information: [place] [address] [phone]           Discharge Medications   Allergies as of 03/16/2017   No Known Allergies     Medication List    STOP taking these medications   azithromycin 250 MG tablet Commonly known as:  ZITHROMAX     TAKE these medications   benzonatate 100 MG capsule Commonly known as:  TESSALON PERLES Take 1 capsule (100 mg total) by mouth 3 (three) times daily as needed for cough.   DHA-EPA-Coenzyme Q10-Vitamin E 120-180-50-30 Caps Take 1 capsule by mouth daily.   ECHINACEA C COMPLETE PO Take 1 tablet by mouth daily.   ferrous sulfate 325 (65 FE) MG tablet Take 325 mg by mouth daily with breakfast.   glimepiride 1 MG tablet Commonly known as:  AMARYL Take 0.5 mg by  mouth  daily with breakfast.   guaiFENesin-dextromethorphan 100-10 MG/5ML syrup Commonly known as:  ROBITUSSIN DM Take 5 mLs by mouth every 4 (four) hours as needed for cough.   multivitamin with minerals Tabs tablet Take 1 tablet by mouth daily.   saccharomyces boulardii 250 MG capsule Commonly known as:  FLORASTOR Take 250 mg by mouth daily.   TURMERIC PO Take 1 Dose by mouth daily.   VENTOLIN HFA 108 (90 Base) MCG/ACT inhaler Generic drug:  albuterol Inhale 2 puffs into the lungs every 6 (six) hours as needed for wheezing.   vitamin B-12 1000 MCG tablet Commonly known as:  CYANOCOBALAMIN Take 1,000 mcg by mouth daily.   vitamin E 400 UNIT capsule Take 400 Units by mouth daily.          Total Time in preparing paper work, data evaluation and todays exam - 10 minutes  Dustin Flock M.D on 03/16/2017 at [place]  [phone]

## 2017-03-17 LAB — CULTURE, BLOOD (ROUTINE X 2)
Culture: NO GROWTH
Culture: NO GROWTH
Special Requests: ADEQUATE
Special Requests: ADEQUATE

## 2017-05-23 ENCOUNTER — Emergency Department: Payer: Medicare Other

## 2017-05-23 ENCOUNTER — Inpatient Hospital Stay: Payer: Medicare Other

## 2017-05-23 ENCOUNTER — Inpatient Hospital Stay
Admission: EM | Admit: 2017-05-23 | Discharge: 2017-05-29 | DRG: 871 | Disposition: A | Discharge status: Skilled Nursing Facility | Payer: Medicare Other | Attending: Internal Medicine | Admitting: Internal Medicine

## 2017-05-23 ENCOUNTER — Inpatient Hospital Stay (HOSPITAL_COMMUNITY)
Admit: 2017-05-23 | Discharge: 2017-05-23 | Disposition: A | Discharge status: Home / Self Care | Payer: Medicare Other | Attending: Internal Medicine | Admitting: Internal Medicine

## 2017-05-23 ENCOUNTER — Encounter: Payer: Self-pay | Admitting: Emergency Medicine

## 2017-05-23 ENCOUNTER — Other Ambulatory Visit: Payer: Self-pay

## 2017-05-23 ENCOUNTER — Inpatient Hospital Stay (HOSPITAL_COMMUNITY): Payer: Medicare Other

## 2017-05-23 DIAGNOSIS — G4700 Insomnia, unspecified: Secondary | ICD-10-CM | POA: Diagnosis present

## 2017-05-23 DIAGNOSIS — Z8673 Personal history of transient ischemic attack (TIA), and cerebral infarction without residual deficits: Secondary | ICD-10-CM

## 2017-05-23 DIAGNOSIS — W1830XA Fall on same level, unspecified, initial encounter: Secondary | ICD-10-CM | POA: Diagnosis present

## 2017-05-23 DIAGNOSIS — R569 Unspecified convulsions: Secondary | ICD-10-CM | POA: Diagnosis present

## 2017-05-23 DIAGNOSIS — I25708 Atherosclerosis of coronary artery bypass graft(s), unspecified, with other forms of angina pectoris: Secondary | ICD-10-CM | POA: Diagnosis not present

## 2017-05-23 DIAGNOSIS — E871 Hypo-osmolality and hyponatremia: Secondary | ICD-10-CM | POA: Diagnosis present

## 2017-05-23 DIAGNOSIS — R52 Pain, unspecified: Secondary | ICD-10-CM

## 2017-05-23 DIAGNOSIS — I5032 Chronic diastolic (congestive) heart failure: Secondary | ICD-10-CM | POA: Diagnosis present

## 2017-05-23 DIAGNOSIS — M1990 Unspecified osteoarthritis, unspecified site: Secondary | ICD-10-CM | POA: Diagnosis present

## 2017-05-23 DIAGNOSIS — R54 Age-related physical debility: Secondary | ICD-10-CM | POA: Diagnosis not present

## 2017-05-23 DIAGNOSIS — R531 Weakness: Secondary | ICD-10-CM | POA: Diagnosis present

## 2017-05-23 DIAGNOSIS — A419 Sepsis, unspecified organism: Secondary | ICD-10-CM | POA: Diagnosis not present

## 2017-05-23 DIAGNOSIS — J45909 Unspecified asthma, uncomplicated: Secondary | ICD-10-CM | POA: Diagnosis present

## 2017-05-23 DIAGNOSIS — G40409 Other generalized epilepsy and epileptic syndromes, not intractable, without status epilepticus: Secondary | ICD-10-CM | POA: Diagnosis present

## 2017-05-23 DIAGNOSIS — I2510 Atherosclerotic heart disease of native coronary artery without angina pectoris: Secondary | ICD-10-CM | POA: Diagnosis present

## 2017-05-23 DIAGNOSIS — D696 Thrombocytopenia, unspecified: Secondary | ICD-10-CM | POA: Diagnosis present

## 2017-05-23 DIAGNOSIS — G9341 Metabolic encephalopathy: Secondary | ICD-10-CM | POA: Diagnosis present

## 2017-05-23 DIAGNOSIS — J8410 Pulmonary fibrosis, unspecified: Secondary | ICD-10-CM | POA: Diagnosis present

## 2017-05-23 DIAGNOSIS — R4182 Altered mental status, unspecified: Secondary | ICD-10-CM

## 2017-05-23 DIAGNOSIS — I340 Nonrheumatic mitral (valve) insufficiency: Secondary | ICD-10-CM | POA: Diagnosis not present

## 2017-05-23 DIAGNOSIS — Z66 Do not resuscitate: Secondary | ICD-10-CM | POA: Diagnosis present

## 2017-05-23 DIAGNOSIS — I110 Hypertensive heart disease with heart failure: Secondary | ICD-10-CM | POA: Diagnosis present

## 2017-05-23 DIAGNOSIS — E119 Type 2 diabetes mellitus without complications: Secondary | ICD-10-CM | POA: Diagnosis present

## 2017-05-23 DIAGNOSIS — E876 Hypokalemia: Secondary | ICD-10-CM | POA: Diagnosis present

## 2017-05-23 DIAGNOSIS — Z79899 Other long term (current) drug therapy: Secondary | ICD-10-CM

## 2017-05-23 DIAGNOSIS — I083 Combined rheumatic disorders of mitral, aortic and tricuspid valves: Secondary | ICD-10-CM | POA: Diagnosis present

## 2017-05-23 DIAGNOSIS — W19XXXA Unspecified fall, initial encounter: Secondary | ICD-10-CM

## 2017-05-23 DIAGNOSIS — Z951 Presence of aortocoronary bypass graft: Secondary | ICD-10-CM | POA: Diagnosis not present

## 2017-05-23 DIAGNOSIS — I4510 Unspecified right bundle-branch block: Secondary | ICD-10-CM | POA: Diagnosis present

## 2017-05-23 DIAGNOSIS — D638 Anemia in other chronic diseases classified elsewhere: Secondary | ICD-10-CM | POA: Diagnosis present

## 2017-05-23 DIAGNOSIS — Y92009 Unspecified place in unspecified non-institutional (private) residence as the place of occurrence of the external cause: Secondary | ICD-10-CM | POA: Diagnosis not present

## 2017-05-23 DIAGNOSIS — F0390 Unspecified dementia without behavioral disturbance: Secondary | ICD-10-CM | POA: Diagnosis present

## 2017-05-23 DIAGNOSIS — Z8551 Personal history of malignant neoplasm of bladder: Secondary | ICD-10-CM

## 2017-05-23 DIAGNOSIS — A401 Sepsis due to streptococcus, group B: Secondary | ICD-10-CM | POA: Diagnosis present

## 2017-05-23 DIAGNOSIS — Z7982 Long term (current) use of aspirin: Secondary | ICD-10-CM

## 2017-05-23 DIAGNOSIS — Z87891 Personal history of nicotine dependence: Secondary | ICD-10-CM | POA: Diagnosis not present

## 2017-05-23 DIAGNOSIS — I214 Non-ST elevation (NSTEMI) myocardial infarction: Secondary | ICD-10-CM | POA: Diagnosis present

## 2017-05-23 DIAGNOSIS — Z952 Presence of prosthetic heart valve: Secondary | ICD-10-CM

## 2017-05-23 DIAGNOSIS — Z7984 Long term (current) use of oral hypoglycemic drugs: Secondary | ICD-10-CM

## 2017-05-23 DIAGNOSIS — Z8711 Personal history of peptic ulcer disease: Secondary | ICD-10-CM

## 2017-05-23 LAB — CSF CELL COUNT WITH DIFFERENTIAL
EOS CSF: 0 %
Eosinophils, CSF: 0 %
Lymphs, CSF: 0 %
Lymphs, CSF: 64 %
Monocyte-Macrophage-Spinal Fluid: 0 %
Monocyte-Macrophage-Spinal Fluid: 33 %
RBC COUNT CSF: 0 /mm3 (ref 0–3)
RBC COUNT CSF: 43 /mm3 — AB (ref 0–3)
Segmented Neutrophils-CSF: 0 %
Segmented Neutrophils-CSF: 3 %
Tube #: 1
Tube #: 3
WBC, CSF: 0 /mm3 (ref 0–5)
WBC, CSF: 7 /mm3 — ABNORMAL HIGH (ref 0–5)

## 2017-05-23 LAB — CBC
HCT: 31.6 % — ABNORMAL LOW (ref 35.0–47.0)
HEMOGLOBIN: 10.3 g/dL — AB (ref 12.0–16.0)
MCH: 30.7 pg (ref 26.0–34.0)
MCHC: 32.7 g/dL (ref 32.0–36.0)
MCV: 93.9 fL (ref 80.0–100.0)
PLATELETS: 62 10*3/uL — AB (ref 150–440)
RBC: 3.36 MIL/uL — ABNORMAL LOW (ref 3.80–5.20)
RDW: 17.9 % — AB (ref 11.5–14.5)
WBC: 23.1 10*3/uL — ABNORMAL HIGH (ref 3.6–11.0)

## 2017-05-23 LAB — CBC WITH DIFFERENTIAL/PLATELET
Basophils Absolute: 0.1 10*3/uL (ref 0–0.1)
Basophils Relative: 0 %
EOS PCT: 0 %
Eosinophils Absolute: 0 10*3/uL (ref 0–0.7)
HEMATOCRIT: 35.7 % (ref 35.0–47.0)
Hemoglobin: 11.8 g/dL — ABNORMAL LOW (ref 12.0–16.0)
LYMPHS PCT: 9 %
Lymphs Abs: 1.3 10*3/uL (ref 1.0–3.6)
MCH: 31 pg (ref 26.0–34.0)
MCHC: 33.1 g/dL (ref 32.0–36.0)
MCV: 93.6 fL (ref 80.0–100.0)
MONO ABS: 0.2 10*3/uL (ref 0.2–0.9)
Monocytes Relative: 1 %
NEUTROS ABS: 13.9 10*3/uL — AB (ref 1.4–6.5)
Neutrophils Relative %: 90 %
PLATELETS: 81 10*3/uL — AB (ref 150–440)
RBC: 3.82 MIL/uL (ref 3.80–5.20)
RDW: 17.9 % — AB (ref 11.5–14.5)
WBC: 15.5 10*3/uL — ABNORMAL HIGH (ref 3.6–11.0)

## 2017-05-23 LAB — APTT: aPTT: 35 seconds (ref 24–36)

## 2017-05-23 LAB — BLOOD CULTURE ID PANEL (REFLEXED)
Acinetobacter baumannii: NOT DETECTED
CANDIDA GLABRATA: NOT DETECTED
Candida albicans: NOT DETECTED
Candida krusei: NOT DETECTED
Candida parapsilosis: NOT DETECTED
Candida tropicalis: NOT DETECTED
ENTEROBACTER CLOACAE COMPLEX: NOT DETECTED
Enterobacteriaceae species: NOT DETECTED
Enterococcus species: NOT DETECTED
Escherichia coli: NOT DETECTED
HAEMOPHILUS INFLUENZAE: NOT DETECTED
Klebsiella oxytoca: NOT DETECTED
Klebsiella pneumoniae: NOT DETECTED
Listeria monocytogenes: NOT DETECTED
NEISSERIA MENINGITIDIS: NOT DETECTED
PROTEUS SPECIES: NOT DETECTED
PSEUDOMONAS AERUGINOSA: NOT DETECTED
STAPHYLOCOCCUS AUREUS BCID: NOT DETECTED
STAPHYLOCOCCUS SPECIES: NOT DETECTED
STREPTOCOCCUS AGALACTIAE: DETECTED — AB
STREPTOCOCCUS PNEUMONIAE: NOT DETECTED
STREPTOCOCCUS SPECIES: DETECTED — AB
Serratia marcescens: NOT DETECTED
Streptococcus pyogenes: NOT DETECTED

## 2017-05-23 LAB — PROTIME-INR
INR: 1.2
INR: 1.3
Prothrombin Time: 15.1 seconds (ref 11.4–15.2)
Prothrombin Time: 16.1 seconds — ABNORMAL HIGH (ref 11.4–15.2)

## 2017-05-23 LAB — GLUCOSE, CAPILLARY
GLUCOSE-CAPILLARY: 151 mg/dL — AB (ref 65–99)
Glucose-Capillary: 200 mg/dL — ABNORMAL HIGH (ref 65–99)
Glucose-Capillary: 119 mg/dL — ABNORMAL HIGH (ref 65–99)
Glucose-Capillary: 147 mg/dL — ABNORMAL HIGH (ref 65–99)

## 2017-05-23 LAB — LACTIC ACID, PLASMA
LACTIC ACID, VENOUS: 4 mmol/L — AB (ref 0.5–1.9)
LACTIC ACID, VENOUS: 2.2 mmol/L — AB (ref 0.5–1.9)
Lactic Acid, Venous: 4.4 mmol/L (ref 0.5–1.9)
Lactic Acid, Venous: 3.3 mmol/L (ref 0.5–1.9)

## 2017-05-23 LAB — URINALYSIS, COMPLETE (UACMP) WITH MICROSCOPIC
BACTERIA UA: NONE SEEN
BILIRUBIN URINE: NEGATIVE
Glucose, UA: NEGATIVE mg/dL
Ketones, ur: NEGATIVE mg/dL
Leukocytes, UA: NEGATIVE
Nitrite: NEGATIVE
PH: 6 (ref 5.0–8.0)
Protein, ur: 100 mg/dL — AB
SPECIFIC GRAVITY, URINE: 1.009 (ref 1.005–1.030)
SQUAMOUS EPITHELIAL / LPF: NONE SEEN (ref 0–5)

## 2017-05-23 LAB — TROPONIN I
TROPONIN I: 0.06 ng/mL — AB (ref <0.03)
Troponin I: 1.03 ng/mL (ref <0.03)
Troponin I: 2 ng/mL (ref <0.03)
Troponin I: 2.82 ng/mL (ref <0.03)

## 2017-05-23 LAB — COMPREHENSIVE METABOLIC PANEL
ALK PHOS: 127 U/L — AB (ref 38–126)
ALT: 20 U/L (ref 14–54)
AST: 46 U/L — AB (ref 15–41)
Albumin: 3.5 g/dL (ref 3.5–5.0)
Anion gap: 12 (ref 5–15)
BUN: 16 mg/dL (ref 6–20)
CALCIUM: 8.3 mg/dL — AB (ref 8.9–10.3)
CHLORIDE: 98 mmol/L — AB (ref 101–111)
CO2: 21 mmol/L — AB (ref 22–32)
CREATININE: 0.85 mg/dL (ref 0.44–1.00)
GFR, EST NON AFRICAN AMERICAN: 58 mL/min — AB (ref >60)
Glucose, Bld: 201 mg/dL — ABNORMAL HIGH (ref 65–99)
Potassium: 3 mmol/L — ABNORMAL LOW (ref 3.5–5.1)
Sodium: 131 mmol/L — ABNORMAL LOW (ref 135–145)
Total Bilirubin: 2.1 mg/dL — ABNORMAL HIGH (ref 0.3–1.2)
Total Protein: 8.4 g/dL — ABNORMAL HIGH (ref 6.5–8.1)

## 2017-05-23 LAB — CREATININE, SERUM
CREATININE: 0.94 mg/dL (ref 0.44–1.00)
GFR calc Af Amer: 59 mL/min — ABNORMAL LOW (ref >60)
GFR, EST NON AFRICAN AMERICAN: 51 mL/min — AB (ref >60)

## 2017-05-23 LAB — HEMOGLOBIN A1C
HEMOGLOBIN A1C: 5.4 % (ref 4.8–5.6)
Mean Plasma Glucose: 108.28 mg/dL

## 2017-05-23 LAB — MAGNESIUM: MAGNESIUM: 1.3 mg/dL — AB (ref 1.7–2.4)

## 2017-05-23 LAB — PHOSPHORUS: PHOSPHORUS: 1.6 mg/dL — AB (ref 2.5–4.6)

## 2017-05-23 LAB — PROTEIN AND GLUCOSE, CSF
GLUCOSE CSF: 80 mg/dL — AB (ref 40–70)
TOTAL PROTEIN, CSF: 33 mg/dL (ref 15–45)

## 2017-05-23 LAB — PROCALCITONIN: PROCALCITONIN: 14.49 ng/mL

## 2017-05-23 LAB — CK: CK TOTAL: 42 U/L (ref 38–234)

## 2017-05-23 LAB — TSH: TSH: 2.157 u[IU]/mL (ref 0.350–4.500)

## 2017-05-23 MED ORDER — PNEUMOCOCCAL VAC POLYVALENT 25 MCG/0.5ML IJ INJ
0.5000 mL | INJECTION | INTRAMUSCULAR | Status: DC
Start: 2017-05-24 — End: 2017-05-29
  Filled 2017-05-23: qty 0.5

## 2017-05-23 MED ORDER — ACETAMINOPHEN 650 MG RE SUPP: 650.0000 mg | Freq: Four times a day (QID) | RECTAL | Status: DC | PRN

## 2017-05-23 MED ORDER — VANCOMYCIN HCL IN DEXTROSE 1-5 GM/200ML-% IV SOLN
1000.0000 mg | INTRAVENOUS | Status: DC
  Administered 2017-05-23 – 2017-05-25 (×3): 1000 mg via INTRAVENOUS
  Filled 2017-05-23 (×4): qty 200

## 2017-05-23 MED ORDER — ONDANSETRON HCL 4 MG PO TABS: 4.0000 mg | ORAL_TABLET | Freq: Four times a day (QID) | ORAL | Status: DC | PRN

## 2017-05-23 MED ORDER — FERROUS SULFATE 325 (65 FE) MG PO TABS
325.0000 mg | ORAL_TABLET | Freq: Every day | ORAL | Status: DC
  Administered 2017-05-26 – 2017-05-29 (×4): 325 mg via ORAL
  Filled 2017-05-23 (×4): qty 1

## 2017-05-23 MED ORDER — ASPIRIN 81 MG PO CHEW
81.0000 mg | CHEWABLE_TABLET | Freq: Every day | ORAL | Status: DC
  Administered 2017-05-25 – 2017-05-29 (×5): 81 mg via ORAL
  Filled 2017-05-23 (×5): qty 1

## 2017-05-23 MED ORDER — SODIUM CHLORIDE 0.9 % IV SOLN: 2.0000 g | INTRAVENOUS | Status: DC

## 2017-05-23 MED ORDER — LORAZEPAM 2 MG/ML IJ SOLN
0.5000 mg | Freq: Once | INTRAMUSCULAR | Status: AC
  Administered 2017-05-23: 14:00:00 0.5 mg via INTRAVENOUS
  Filled 2017-05-23: qty 1

## 2017-05-23 MED ORDER — HEPARIN (PORCINE) IN NACL 100-0.45 UNIT/ML-% IJ SOLN
850.0000 [IU]/h | INTRAMUSCULAR | Status: DC
  Administered 2017-05-23 – 2017-05-25 (×3): 850 [IU]/h via INTRAVENOUS
  Filled 2017-05-23 (×3): qty 250

## 2017-05-23 MED ORDER — ENOXAPARIN SODIUM 40 MG/0.4ML ~~LOC~~ SOLN: 40.0000 mg | SUBCUTANEOUS | Status: DC

## 2017-05-23 MED ORDER — CEFTRIAXONE SODIUM 2 G IJ SOLR
2.0000 g | Freq: Two times a day (BID) | INTRAMUSCULAR | Status: DC
  Administered 2017-05-23 – 2017-05-24 (×2): 2 g via INTRAVENOUS
  Filled 2017-05-23: qty 20
  Filled 2017-05-23 (×2): qty 2

## 2017-05-23 MED ORDER — INSULIN ASPART 100 UNIT/ML ~~LOC~~ SOLN
0.0000 [IU] | Freq: Three times a day (TID) | SUBCUTANEOUS | Status: DC
  Administered 2017-05-23: 18:00:00 1 [IU] via SUBCUTANEOUS
  Filled 2017-05-23 (×2): qty 1

## 2017-05-23 MED ORDER — SODIUM CHLORIDE 0.9 % IV BOLUS
1000.0000 mL | Freq: Once | INTRAVENOUS | Status: AC
  Administered 2017-05-23: 1000 mL via INTRAVENOUS

## 2017-05-23 MED ORDER — LORAZEPAM 2 MG/ML IJ SOLN
0.5000 mg | Freq: Four times a day (QID) | INTRAMUSCULAR | Status: DC | PRN
  Administered 2017-05-23 – 2017-05-24 (×3): 0.5 mg via INTRAVENOUS
  Filled 2017-05-23 (×4): qty 1

## 2017-05-23 MED ORDER — SODIUM CHLORIDE 0.9 % IV SOLN
2.0000 g | Freq: Once | INTRAVENOUS | Status: AC
  Administered 2017-05-23: 18:00:00 2 g via INTRAVENOUS
  Filled 2017-05-23: qty 2000
  Filled 2017-05-23: qty 2

## 2017-05-23 MED ORDER — SODIUM CHLORIDE 0.9 % IV SOLN: INTRAVENOUS | Status: DC

## 2017-05-23 MED ORDER — ACETAMINOPHEN 650 MG RE SUPP
650.0000 mg | Freq: Once | RECTAL | Status: AC
  Administered 2017-05-23: 650 mg via RECTAL

## 2017-05-23 MED ORDER — ASPIRIN EC 81 MG PO TBEC: 81.0000 mg | DELAYED_RELEASE_TABLET | Freq: Every day | ORAL | Status: DC

## 2017-05-23 MED ORDER — POTASSIUM CHLORIDE IN NACL 20-0.9 MEQ/L-% IV SOLN
INTRAVENOUS | Status: DC
  Administered 2017-05-23 – 2017-05-26 (×3): via INTRAVENOUS
  Filled 2017-05-23 (×6): qty 1000

## 2017-05-23 MED ORDER — LORAZEPAM 2 MG/ML IJ SOLN
INTRAMUSCULAR | Status: AC
  Administered 2017-05-23: 2 mg via INTRAVENOUS
  Filled 2017-05-23: qty 1

## 2017-05-23 MED ORDER — SODIUM CHLORIDE 0.9 % IV SOLN
2.0000 g | Freq: Once | INTRAVENOUS | Status: AC
  Administered 2017-05-23: 2 g via INTRAVENOUS
  Filled 2017-05-23: qty 2

## 2017-05-23 MED ORDER — LORAZEPAM 2 MG/ML IJ SOLN
2.0000 mg | Freq: Once | INTRAMUSCULAR | Status: AC
  Administered 2017-05-23: 2 mg via INTRAVENOUS

## 2017-05-23 MED ORDER — POTASSIUM CHLORIDE 10 MEQ/100ML IV SOLN
10.0000 meq | INTRAVENOUS | Status: AC
  Administered 2017-05-23 (×2): 10 meq via INTRAVENOUS
  Filled 2017-05-23 (×2): qty 100

## 2017-05-23 MED ORDER — LORAZEPAM 2 MG/ML IJ SOLN
INTRAMUSCULAR | Status: AC
  Filled 2017-05-23: qty 1

## 2017-05-23 MED ORDER — SODIUM CHLORIDE 0.9 % IV SOLN
2.0000 g | Freq: Four times a day (QID) | INTRAVENOUS | Status: DC
  Administered 2017-05-24 (×2): 2 g via INTRAVENOUS
  Filled 2017-05-23 (×2): qty 2000
  Filled 2017-05-23 (×2): qty 2
  Filled 2017-05-23 (×2): qty 2000

## 2017-05-23 MED ORDER — SODIUM CHLORIDE 0.9 % IV SOLN: 1.0000 g | Freq: Once | INTRAVENOUS | Status: DC

## 2017-05-23 MED ORDER — IBUPROFEN 400 MG PO TABS
400.0000 mg | ORAL_TABLET | Freq: Four times a day (QID) | ORAL | Status: DC | PRN
  Administered 2017-05-27 – 2017-05-28 (×3): 400 mg via ORAL
  Filled 2017-05-23 (×3): qty 1

## 2017-05-23 MED ORDER — POTASSIUM CHLORIDE 10 MEQ/100ML IV SOLN
10.0000 meq | Freq: Once | INTRAVENOUS | Status: AC
  Administered 2017-05-24: 10 meq via INTRAVENOUS
  Filled 2017-05-23 (×2): qty 100

## 2017-05-23 MED ORDER — HEPARIN BOLUS VIA INFUSION
4000.0000 [IU] | Freq: Once | INTRAVENOUS | Status: AC
  Administered 2017-05-23: 19:00:00 4000 [IU] via INTRAVENOUS
  Filled 2017-05-23: qty 4000

## 2017-05-23 MED ORDER — LIDOCAINE HCL (PF) 1 % IJ SOLN
INTRAMUSCULAR | Status: AC
  Filled 2017-05-23: qty 5

## 2017-05-23 MED ORDER — ONDANSETRON HCL 4 MG/2ML IJ SOLN: 4.0000 mg | Freq: Four times a day (QID) | INTRAMUSCULAR | Status: DC | PRN

## 2017-05-23 MED ORDER — ACETAMINOPHEN 650 MG RE SUPP
RECTAL | Status: AC
  Administered 2017-05-23: 650 mg via RECTAL
  Filled 2017-05-23: qty 1

## 2017-05-23 MED ORDER — POLYETHYLENE GLYCOL 3350 17 G PO PACK: 17.0000 g | PACK | Freq: Every day | ORAL | Status: DC | PRN

## 2017-05-23 MED ORDER — MAGNESIUM SULFATE 4 GM/100ML IV SOLN
4.0000 g | Freq: Once | INTRAVENOUS | Status: AC
  Administered 2017-05-23: 4 g via INTRAVENOUS
  Filled 2017-05-23: qty 100

## 2017-05-23 MED ORDER — POTASSIUM CHLORIDE 10 MEQ/100ML IV SOLN: 10.0000 meq | INTRAVENOUS | Status: DC

## 2017-05-23 MED ORDER — VANCOMYCIN HCL IN DEXTROSE 1-5 GM/200ML-% IV SOLN
1000.0000 mg | Freq: Once | INTRAVENOUS | Status: AC
  Administered 2017-05-23: 1000 mg via INTRAVENOUS
  Filled 2017-05-23: qty 200

## 2017-05-23 MED ORDER — POTASSIUM PHOSPHATES 15 MMOLE/5ML IV SOLN
15.0000 mmol | Freq: Once | INTRAVENOUS | Status: AC
  Administered 2017-05-23: 15 mmol via INTRAVENOUS
  Filled 2017-05-23: qty 5

## 2017-05-23 MED ORDER — ACETAMINOPHEN 325 MG PO TABS
650.0000 mg | ORAL_TABLET | Freq: Four times a day (QID) | ORAL | Status: DC | PRN
  Administered 2017-05-25 – 2017-05-29 (×6): 650 mg via ORAL
  Filled 2017-05-23 (×6): qty 2

## 2017-05-23 NOTE — ED Notes (Signed)
Patient is more awake now and agitated.  Attempting to pull at various cords, IVs and blood pressure cuff.  This RN holding patient's hands.  Daughter assisting to redirect.  Daughter states patient is asking to go to the bathroom.  Patient is too weak at this time and instructed to use brief.  MD notified of patient's agitation.

## 2017-05-23 NOTE — Progress Notes (Signed)
Pharmacy Antibiotic Note  Ariana Miller is a 84 y.o. female admitted on 05/23/2017 with sepsis of unknown source. Patient admitted with witnessed seizure. Pharmacy has been consulted for vancomyicn dosing.Patient also ordered ceftriaxone 2g IV Q12hr for possible meningitis.   Plan: Patient received vancomycin 1g IV x1 in ED. Will continue patient on vancomycin 750mg  IV Q24hr for goal trough of 15-20. Will obtain trough prior to dose on 5/24.  Per conversation with MD, will add ampicillin 2g IV Q6hr (renally adjusted dose).   Procalcitonin pending. MRSA PCR not ordered as patient with no signs of respiratory infection.    Weight: 140 lb (63.5 kg)  Temp (24hrs), Avg:101.5 F (38.6 C), Min:100.5 F (38.1 C), Max:102.6 F (39.2 C)  Recent Labs  Lab 05/23/17 0713 05/23/17 0924  WBC 15.5*  --   CREATININE 0.85  --   LATICACIDVEN 4.0* 4.4*    Estimated Creatinine Clearance: 35.1 mL/min (by C-G formula based on SCr of 0.85 mg/dL).    No Known Allergies  Antimicrobials this admission: Ampicillin 5/21  >>  Ceftriaxone 5/21 >> Vancomycin 5/21 >>   Dose adjustments this admission: N/A  Microbiology results: 5/21 BCx: pending  5/21 CSF: no organisms seen on gram stain  5/21 Procalcitonin   Thank you for allowing pharmacy to be a part of this patient's care.  Simpson,Michael L 05/23/2017 11:28 AM

## 2017-05-23 NOTE — Progress Notes (Signed)
ANTICOAGULATION CONSULT NOTE - Initial Consult  Pharmacy Consult for heparin gtt Indication: chest pain/ACS  No Known Allergies  Patient Measurements: Weight: 151 lb 14.4 oz (68.9 kg) Heparin Dosing Weight: 68.9kg  Vital Signs: Temp: 98.1 F (36.7 C) (05/21 1156) Temp Source: Oral (05/21 1156) BP: 129/62 (05/21 1156) Pulse Rate: 82 (05/21 1156)  Labs: Recent Labs    05/23/17 0713 05/23/17 1217 05/23/17 1616  HGB 11.8* 10.3*  --   HCT 35.7 31.6*  --   PLT 81* 62*  --   LABPROT 15.1  --   --   INR 1.20  --   --   CREATININE 0.85 0.94  --   CKTOTAL 42  --   --   TROPONINI 0.06* 1.03* 2.00*    Estimated Creatinine Clearance: 33.1 mL/min (by C-G formula based on SCr of 0.94 mg/dL).   Medical History: Past Medical History:  Diagnosis Date  . Anemia   . Arthritis   . Asthma   . Bladder cancer ([place])   . CAD (coronary artery disease)    a. 2-V CABG 03/2007 (LIMA-LAD, SVG-D1)  . Diabetes mellitus with complication ([place])   . Diastolic heart failure ([place])   . History of gastric ulcer   . HTN (hypertension)   . Pulmonary fibrosis ([place])   . Severe aortic stenosis    a. s/p [facility]pent[facility]-Edwards AVR 03/2007; b. echo 2009 (pre-AVR): nl LVEF, mild LVH, severe AS, mild MS, trivial AR/MR/TR/PR, nl RVSF    Medications:  Medications Prior to Admission  Medication Sig Dispense Refill Last Dose  . aspirin EC 81 MG tablet Take 81 mg by mouth daily.   05/21/2017 at 2000  . ferrous sulfate 325 (65 FE) MG tablet Take 325 mg by mouth daily with breakfast.   05/21/2017 at 2000  . glimepiride (AMARYL) 1 MG tablet Take 0.5 mg by mouth daily as needed (high blood sugar).    05/23/2017 at 0630  . benzonatate (TESSALON PERLES) 100 MG capsule Take 1 capsule (100 mg total) by mouth 3 (three) times daily as needed for cough. (Patient not taking: Reported on 05/23/2017) 30 capsule 0 Completed Course at Unknown time  . guaiFENesin-dextromethorphan (ROBITUSSIN DM) 100-10 MG/5ML syrup Take 5 mLs by mouth  every 4 (four) hours as needed for cough. (Patient not taking: Reported on 05/23/2017) 118 mL 0 Not Taking at Unknown time   Scheduled:  . aspirin  81 mg Oral Daily  . [START ON 05/24/2017] ferrous sulfate  325 mg Oral Q breakfast  . heparin  4,000 Units Intravenous Once  . insulin aspart  0-9 Units Subcutaneous TID WC  . lidocaine (PF)      . [START ON 05/24/2017] pneumococcal 23 valent vaccine  0.5 mL Intramuscular Tomorrow-1000   Infusions:  . 0.9 % NaCl with KCl 20 mEq / L 50 mL/hr at 05/23/17 1250  . ampicillin (OMNIPEN) IV    . ampicillin (OMNIPEN) IV    . cefTRIAXone (ROCEPHIN)  IV    . heparin    . potassium PHOSPHATE IVPB (mmol)    . vancomycin     PRN: acetaminophen **OR** acetaminophen, ibuprofen, ondansetron **OR** ondansetron (ZOFRAN) IV, polyethylene glycol Anti-infectives (From admission, onward)   Start     Dose/Rate Route Frequency Ordered Stop   05/23/17 2200  cefTRIAXone (ROCEPHIN) 2 g in sodium chloride 0.9 % 100 mL IVPB     2 g 200 mL/hr over 30 Minutes Intravenous Every 12 hours 05/23/17 1131     05/23/17 2000  vancomycin (VANCOCIN) IVPB 1000 mg/200 mL premix     1,000 mg 200 mL/hr over 60 Minutes Intravenous Every 24 hours 05/23/17 1128     05/23/17 1800  ampicillin (OMNIPEN) 2 g in sodium chloride 0.9 % 100 mL IVPB     2 g 300 mL/hr over 20 Minutes Intravenous Every 6 hours 05/23/17 1140     05/23/17 1145  ampicillin (OMNIPEN) 2 g in sodium chloride 0.9 % 100 mL IVPB     2 g 300 mL/hr over 20 Minutes Intravenous  Once 05/23/17 1140     05/23/17 1130  cefTRIAXone (ROCEPHIN) 2 g in sodium chloride 0.9 % 100 mL IVPB  Status:  Discontinued     2 g 200 mL/hr over 30 Minutes Intravenous Every 24 hours 05/23/17 1126 05/23/17 1131   05/23/17 1015  cefTRIAXone (ROCEPHIN) 2 g in sodium chloride 0.9 % 100 mL IVPB     2 g 200 mL/hr over 30 Minutes Intravenous  Once 05/23/17 1008 05/23/17 1050   05/23/17 1015  vancomycin (VANCOCIN) IVPB 1000 mg/200 mL premix     1,000  mg 200 mL/hr over 60 Minutes Intravenous  Once 05/23/17 1009 05/23/17 1215   05/23/17 1000  cefTRIAXone (ROCEPHIN) 1 g in sodium chloride 0.9 % 100 mL IVPB  Status:  Discontinued     1 g 200 mL/hr over 30 Minutes Intravenous  Once 05/23/17 0955 05/23/17 1008      Assessment: 84 year old female with ACS requiring heparin gtt per pharmacy.   Goal of Therapy:  Heparin level 0.3-0.7 units/ml Monitor platelets by anticoagulation protocol: Yes   Plan:  Give 4000 units bolus x 1 Start heparin infusion at 850 units/hr Check anti-Xa level in 8 hours and daily while on heparin Continue to monitor H&H and platelets  [name] 05/23/2017,6:08 PM

## 2017-05-23 NOTE — Progress Notes (Signed)
Pharmacy Electrolyte Monitoring Consult:  Pharmacy consulted to assist in monitoring and replacing electrolytes in this 84 y.o. female admitted on 05/23/2017 with witnessed seizure and being treated for sepsis/possible meningitis.   Patient currently ordered NS at 64mL/hr.   Labs:  Sodium (mmol/L)  Date Value  05/23/2017 131 (L)  01/05/2012 135 (L)   Potassium (mmol/L)  Date Value  05/23/2017 3.0 (L)  01/05/2012 3.8   Magnesium (mg/dL)  Date Value  10/19/2015 1.9   Phosphorus (mg/dL)  Date Value  10/19/2015 3.3   Calcium (mg/dL)  Date Value  05/23/2017 8.3 (L)   Calcium, Total (mg/dL)  Date Value  01/05/2012 7.8 (L)   Albumin (g/dL)  Date Value  05/23/2017 3.5  01/05/2012 3.3 (L)    Assessment/Plan: Potassium 19mEq IV Q1hr x 3 doses. Will change MIVF to NS/33mEq KCL at 23mL/hr. Magnesium and phosphorus levels ordered. Goal potassium ~ 4 and goal magnesium ~ 2 in setting seizure.   Pharmacy will continue to monitor and adjust per consult.   Samaira Holzworth L 05/23/2017 11:45 AM

## 2017-05-23 NOTE — ED Notes (Signed)
Discussed possibility of calling a code sepsis with EDP at this time.  EDP stated, not at this time.  Will continue to monitor.

## 2017-05-23 NOTE — ED Notes (Signed)
While MD and this RN were in the room patient began moaning and convulsing.  Patient placed on non-re breather during episode.  Patient's daughter states, "this has never happened before."  Episode lasted approx. 30 seconds.  Patient placed on seizure precautions at this time.

## 2017-05-23 NOTE — Progress Notes (Addendum)
Pharmacy Electrolyte Monitoring Consult:  Pharmacy consulted to assist in monitoring and replacing electrolytes in this 84 y.o. female admitted on 05/23/2017 with witnessed seizure and being treated for sepsis/possible meningitis.   Patient currently ordered NS at 89mL/hr.   Labs:  Sodium (mmol/L)  Date Value  05/23/2017 131 (L)  01/05/2012 135 (L)   Potassium (mmol/L)  Date Value  05/23/2017 3.0 (L)  01/05/2012 3.8   Magnesium (mg/dL)  Date Value  05/23/2017 1.3 (L)   Phosphorus (mg/dL)  Date Value  05/23/2017 1.6 (L)   Calcium (mg/dL)  Date Value  05/23/2017 8.3 (L)   Calcium, Total (mg/dL)  Date Value  01/05/2012 7.8 (L)   Albumin (g/dL)  Date Value  05/23/2017 3.5  01/05/2012 3.3 (L)    Assessment/Plan: Potassium 34mEq IV Q1hr x 3 doses. Will change MIVF to NS/65mEq KCL at 28mL/hr. Magnesium and phosphorus levels ordered. Goal potassium ~ 4 and goal magnesium ~ 2 in setting seizure.   5/21 @ 1217:  Mag= 1.3,  Phos= 1.6. Will order Magnesium sulfate 4 gram IV x 1. Will order Potassium Phosphate 15 mmol IV x 1. Will recheck Phos and K at 2300 and  Mag with am labs  Addendum:  Pt went to MRI and electrolytes not hung. Rescheduled Mag and KPhos IVs to 1600 and rescheduled labs.  Pharmacy will continue to monitor and adjust per consult.   Glenn Gullickson A 05/23/2017 1:17 PM

## 2017-05-23 NOTE — ED Triage Notes (Signed)
Per pts daughter whom pt lives with pt called out this AM at 6am and daughter found pt on the floor and pt was not able to get up on her own. Pt has hx/o stroke and has weakness on the left side. Pts baseline is walking with cane and mild dementia. Per daughter pt CBG was 203 and fever of 101F.

## 2017-05-23 NOTE — ED Notes (Signed)
Removed patient from non-rebreather and placed her on 2L nasal cannula for comfort.  Patient's oxygen saturation is 94% on room air.  Respirations are even and not labored at this time.

## 2017-05-23 NOTE — Progress Notes (Signed)
PHARMACY - PHYSICIAN COMMUNICATION CRITICAL VALUE ALERT - BLOOD CULTURE IDENTIFICATION (BCID)  Ariana Miller is an 84 y.o. female who presented to [facility] on 05/23/2017 with a chief complaint of meningitis   Assessment:  Strep agalactiae in 4/4 bottles  (include suspected source if known)  Name of physician (or Provider) Contacted: Dr Benjie Karvonen  Current antibiotics: ampicillin, vancomycin, rocephin   Changes to prescribed antibiotics recommended:  Patient is on recommended antibiotics - No changes needed  Results for orders placed or performed during the hospital encounter of 05/23/17  Blood Culture ID Panel (Reflexed) (Collected: 05/23/2017  7:13 AM)  Result Value Ref Range   Enterococcus species NOT DETECTED NOT DETECTED   Listeria monocytogenes NOT DETECTED NOT DETECTED   Staphylococcus species NOT DETECTED NOT DETECTED   Staphylococcus aureus NOT DETECTED NOT DETECTED   Streptococcus species DETECTED (A) NOT DETECTED   Streptococcus agalactiae DETECTED (A) NOT DETECTED   Streptococcus pneumoniae NOT DETECTED NOT DETECTED   Streptococcus pyogenes NOT DETECTED NOT DETECTED   Acinetobacter baumannii NOT DETECTED NOT DETECTED   Enterobacteriaceae species NOT DETECTED NOT DETECTED   Enterobacter cloacae complex NOT DETECTED NOT DETECTED   Escherichia coli NOT DETECTED NOT DETECTED   Klebsiella oxytoca NOT DETECTED NOT DETECTED   Klebsiella pneumoniae NOT DETECTED NOT DETECTED   Proteus species NOT DETECTED NOT DETECTED   Serratia marcescens NOT DETECTED NOT DETECTED   Haemophilus influenzae NOT DETECTED NOT DETECTED   Neisseria meningitidis NOT DETECTED NOT DETECTED   Pseudomonas aeruginosa NOT DETECTED NOT DETECTED   Candida albicans NOT DETECTED NOT DETECTED   Candida glabrata NOT DETECTED NOT DETECTED   Candida krusei NOT DETECTED NOT DETECTED   Candida parapsilosis NOT DETECTED NOT DETECTED   Candida tropicalis NOT DETECTED NOT DETECTED    Donna Christen Ajee Heasley 05/23/2017  5:32  PM

## 2017-05-23 NOTE — ED Notes (Signed)
Dr. Archie Balboa performed a lumbar puncture.  This RN assisted.  Patient tolerated well.

## 2017-05-23 NOTE — Progress Notes (Signed)
Chaplain responded to an OR for an AD. Pt daughter is HBPOA. Pt ws being moved to 109. Chaplain esorted daughter to waiting room and practiced active listening. Will follow up.    05/23/17 1200  Clinical Encounter Type  Visited With Patient and family together  Visit Type Initial  Referral From Physician  Spiritual Encounters  Spiritual Needs Brochure;Prayer;Emotional

## 2017-05-23 NOTE — H&P (Addendum)
[facility] [place] NAME: Ariana Miller    MR#:  [id number]  DATE OF BIRTH:  [birth year]  DATE OF ADMISSION:  05/23/2017  PRIMARY CARE PHYSICIAN: Rusty Aus, MD   REQUESTING/REFERRING PHYSICIAN: dr Archie Balboa  CHIEF COMPLAINT:   Fall and weakness HISTORY OF PRESENT ILLNESS:  Ariana Miller  is a 84 y.o. female with a known history of mild dementia chronic anemia, bladder cancer, CAD/CABG, diabetes and chronic diastolic heart failure with preserved ejection fraction who presents to the emergency room due to a fall and generalized weakness. Patient's daughter is at bedside and reports that this morning at approximately 6:30 AM the patient hollered out to her daughter because she fell.  It does not appear that this was a syncopal episode.  Patient was not having any symptoms prior to the fall but daughter believes it was a mechanical fall.  Patient was using her cane.  Patient daughter does not believe that the patient hit her head.  They try to get her up but she was very weak. No focal neurological deficits were noted.  She was brought to the ER by her family members, while in the emergency room she had a witnessed 32nd generalized tonic-clonic seizure in the emergency room.  She has never had seizures in her life.  She also was noted to have a temperature of 102. Lumbar puncture was performed by ER physician.  She is started on vancomycin and ceftriaxone. Patient currently is lethargic due to the Ativan given for her seizure.  PAST MEDICAL HISTORY:   Past Medical History:  Diagnosis Date  . Anemia   . Arthritis   . Asthma   . Bladder cancer ([place])   . CAD (coronary artery disease)    a. 2-V CABG 03/2007 (LIMA-LAD, SVG-D1)  . Diabetes mellitus with complication ([place])   . Diastolic heart failure ([place])   . History of gastric ulcer   . HTN (hypertension)   . Pulmonary fibrosis ([place])   . Severe aortic stenosis    a. s/p Carpentier-Edwards AVR  03/2007; b. echo 2009 (pre-AVR): nl LVEF, mild LVH, severe AS, mild MS, trivial AR/MR/TR/PR, nl RVSF    PAST SURGICAL HISTORY:   Past Surgical History:  Procedure Laterality Date  . AORTIC VALVE REPLACEMENT (AVR)/CORONARY ARTERY BYPASS GRAFTING (CABG)    . CARDIAC VALVE REPLACEMENT    . CAROTID ENDARTERECTOMY    . CHOLECYSTECTOMY    . CORONARY ARTERY BYPASS GRAFT    . CYSTOSCOPY W/ RETROGRADES Bilateral 12/21/2015   Procedure: CYSTOSCOPY WITH RETROGRADE PYELOGRAM;  Surgeon: Hollice Espy, MD;  Location: [facility] ORS;  Service: Urology;  Laterality: Bilateral;  . EYE SURGERY Right    Catract Extraction with IOL  . TRANSURETHRAL RESECTION OF BLADDER TUMOR WITH MITOMYCIN-C N/A 12/21/2015   Procedure: TRANSURETHRAL RESECTION OF BLADDER TUMOR WITH MITOMYCIN-C;  Surgeon: Hollice Espy, MD;  Location: [facility] ORS;  Service: Urology;  Laterality: N/A;    SOCIAL HISTORY:   Social History   Tobacco Use  . Smoking status: Former Smoker    Types: Cigarettes  . Smokeless tobacco: Never Used  Substance Use Topics  . Alcohol use: No    FAMILY HISTORY:   Family History  Problem Relation Age of Onset  . Hepatitis Mother   . Liver disease Father   . Asthma Unknown   . Osteoporosis Unknown   . Hypertension Unknown   . Bladder Cancer Neg Hx   . Kidney cancer Neg Hx  DRUG ALLERGIES:  No Known Allergies  REVIEW OF SYSTEMS:   Review of Systems  Unable to perform ROS: Acuity of condition    MEDICATIONS AT HOME:   Prior to Admission medications   Medication Sig Start Date End Date Taking? Authorizing Provider  aspirin EC 81 MG tablet Take 81 mg by mouth daily.   Yes [provider]  ferrous sulfate 325 (65 FE) MG tablet Take 325 mg by mouth daily with breakfast.   Yes [provider]  glimepiride (AMARYL) 1 MG tablet Take 0.5 mg by mouth daily as needed (high blood sugar).    Yes [provider]  benzonatate (TESSALON PERLES) 100 MG capsule Take 1 capsule  (100 mg total) by mouth 3 (three) times daily as needed for cough. Patient not taking: Reported on 05/23/2017 03/16/17 03/16/18  Dustin Flock, MD  guaiFENesin-dextromethorphan [facility] DM) 100-10 MG/5ML syrup Take 5 mLs by mouth every 4 (four) hours as needed for cough. Patient not taking: Reported on 05/23/2017 03/16/17   Dustin Flock, MD      VITAL SIGNS:  Blood pressure (!) 118/47, pulse 87, temperature (!) 102.6 F (39.2 C), temperature source Axillary, resp. rate (!) 30, weight 63.5 kg (140 lb), SpO2 96 %.  PHYSICAL EXAMINATION:   Physical Exam  Constitutional: She appears well-developed and well-nourished. No distress.  HENT:  Head: Normocephalic and atraumatic.  Eyes: Pupils are equal, round, and reactive to light. No scleral icterus.  Sluggish  Neck: Normal range of motion. Neck supple. No JVD present. No tracheal deviation present.  Cardiovascular: Normal rate and regular rhythm. Exam reveals no gallop and no friction rub.  Murmur heard. Pulmonary/Chest: Effort normal and breath sounds normal. No respiratory distress. She has no wheezes. She has no rales. She exhibits no tenderness.  Abdominal: Soft. Bowel sounds are normal. She exhibits no distension and no mass. There is no tenderness. There is no rebound and no guarding.  Musculoskeletal: She exhibits no edema, tenderness or deformity.  Neurological:  Sedated/lethargic from Ativan  Skin: Skin is warm. No rash noted. No erythema.  Psychiatric:  Lethargic from [place]:   CBC Recent Labs  Lab 05/23/17 0713  WBC 15.5*  HGB 11.8*  HCT 35.7  PLT 81*   ------------------------------------------------------------------------------------------------------------------  Chemistries  Recent Labs  Lab 05/23/17 0713  NA 131*  K 3.0*  CL 98*  CO2 21*  GLUCOSE 201*  BUN 16  CREATININE 0.85  CALCIUM 8.3*  AST 46*  ALT 20  ALKPHOS 127*  BILITOT 2.1*    ------------------------------------------------------------------------------------------------------------------  Cardiac Enzymes Recent Labs  Lab 05/23/17 0713  TROPONINI 0.06*   ------------------------------------------------------------------------------------------------------------------  RADIOLOGY:  Dg Chest 2 View  Result Date: 05/23/2017 CLINICAL DATA:  Found on floor, unable to get up on her own, history of stroke with LEFT-sided weakness, dementia, former smoker EXAM: CHEST - 2 VIEW COMPARISON:  03/14/2017 FINDINGS: Upper normal heart size post CABG and AVR. Atherosclerotic calcification aorta. Peribronchial thickening with diffuse interstitial changes likely representing pulmonary edema, little changed. Slight decrease in lung volumes since previous exam. No pleural effusion or pneumothorax. Marked osseous demineralization with nonunion of an old proximal LEFT humeral fracture and prior RIGHT shoulder replacement. IMPRESSION: Diffuse interstitial infiltrates likely representing pulmonary edema, little changed. Electronically Signed   By: Lavonia Dana M.D.   On: 05/23/2017 08:05   Ct Head Wo Contrast  Result Date: 05/23/2017 CLINICAL DATA:  Head trauma with ataxia.  Initial encounter. EXAM: CT HEAD WITHOUT  CONTRAST TECHNIQUE: Contiguous axial images were obtained from the base of the skull through the vertex without intravenous contrast. COMPARISON:  10/20/2015 FINDINGS: Brain: No evidence of acute infarction, hemorrhage, hydrocephalus, extra-axial collection or mass lesion/mass effect. Left frontal cortically based calcification, chronic and incidental. Age normal brain volume and white matter appearance Vascular: No hyperdense vessel or unexpected calcification. Skull: Normal. Negative for fracture or focal lesion. Sinuses/Orbits: No acute finding. IMPRESSION: No evidence of injury.  Age normal head CT. Electronically Signed   By: Monte Fantasia M.D.   On: 05/23/2017 07:58     EKG:  Sinus tach with right bundle branch block no ST elevation or depression  IMPRESSION AND PLAN:   84 year old female with a history of mild dementia, chronic diastolic heart failure who presents after a fall with generalized weakness.  1.  Sepsis: Patient presents with fever, leukocytosis and tachycardia Sepsis of unclear etiology at this time. Chest x-ray without evidence of pneumonia.  UA without evidence of UTI Follow-up on lumbar puncture. Empiric vancomycin, ampicillin and ceftriaxone for possible meninigitis Low-dose IV fluids Follow lactic acid level  2.  Seizure, witnessed: EEG MRI brain Neurology consultation requested Seizure precautions  3.  Hypokalemia: Replete and recheck in a.m.  4.  Mild hyponatremia: Gentle IV fluids due to chronic diastolic heart failure and repeat in a.m. Check TSH  5.  Diabetes: Sliding scale with diabetes nurse consult requested  6.  Mildly elevated troponin: Continue telemetry and follow troponins  7.  Thrombocytopenia: Follow platelet count.  She had platelets that were low back in 2009  8.  Anemia of chronic disease: Continue ferrous sulfate  9.  History of CAD/CABG: Patient is only on aspirin which we will continue     All the records are reviewed and case discussed with ED provider. Management plans discussed with the patient's daughter and she is in agreement  CODE STATUS: DNR  TOTAL TIME TAKING CARE OF THIS PATIENT: 42 minutes.    Auden Wettstein M.D on 05/23/2017 at 10:27 AM  Between 7am to 6pm - Pager - [phone]  After 6pm go to [email or web address] - password [email or web address] [facility]  Office  [phone]  CC: Primary care physician; Rusty Aus, MD

## 2017-05-23 NOTE — ED Provider Notes (Signed)
[facility] Emergency Department Provider Note   ____________________________________________   I have reviewed the triage vital signs and the nursing notes.   HISTORY  Chief Complaint Abnormal behavior, fall  History limited by: Altered Mental Status   HPI Ariana Miller is a 84 y.o. female who presents to the emergency department today today brought in by family because of concern for altered behavior and fall. The patient called her daughter today but apparently only said the daughters name. When daughter came to check on her she found her on the floor. She thinks the patient was heading to the bathroom. The daughter states the patient did vomit. The grandchildren checked on the patient yesterday and did not report any abnormal behavior. Patient does have a history of stroke. Patient unable to give any history.   Per medical record review patient has a history of DM, HTN, CAD.  Past Medical History:  Diagnosis Date  . Anemia   . Arthritis   . Asthma   . Bladder cancer ([place])   . CAD (coronary artery disease)    a. 2-V CABG 03/2007 (LIMA-LAD, SVG-D1)  . Diabetes mellitus with complication ([place])   . History of gastric ulcer   . HTN (hypertension)   . Pulmonary fibrosis ([place])   . Severe aortic stenosis    a. s/p Carpentier-Edwards AVR 03/2007; b. echo 2009 (pre-AVR): nl LVEF, mild LVH, severe AS, mild MS, trivial AR/MR/TR/PR, nl RVSF    Patient Active Problem List   Diagnosis Date Noted  . Dyspnea 03/15/2017  . Hypokalemia 03/15/2017  . Benign essential hypertension 10/30/2015  . Rheumatic disease of heart valve 10/27/2015  . Weakness 10/19/2015  . Pressure injury of skin 10/19/2015  . Interstitial pulmonary fibrosis ([place]) 03/12/2015  . Reactive airway disease that is not asthma 03/02/2015  . Hyponatremia 03/02/2015  . Diabetes mellitus type 2, controlled, without complications ([place]) 57/84/6962  . Accelerated hypertension 03/02/2015  . CAD  (coronary artery disease) 03/02/2015  . Hyperlipidemia, mixed 02/26/2015    Past Surgical History:  Procedure Laterality Date  . AORTIC VALVE REPLACEMENT (AVR)/CORONARY ARTERY BYPASS GRAFTING (CABG)    . CARDIAC VALVE REPLACEMENT    . CAROTID ENDARTERECTOMY    . CHOLECYSTECTOMY    . CORONARY ARTERY BYPASS GRAFT    . CYSTOSCOPY W/ RETROGRADES Bilateral 12/21/2015   Procedure: CYSTOSCOPY WITH RETROGRADE PYELOGRAM;  Surgeon: Hollice Espy, MD;  Location: [facility] ORS;  Service: Urology;  Laterality: Bilateral;  . EYE SURGERY Right    Catract Extraction with IOL  . TRANSURETHRAL RESECTION OF BLADDER TUMOR WITH MITOMYCIN-C N/A 12/21/2015   Procedure: TRANSURETHRAL RESECTION OF BLADDER TUMOR WITH MITOMYCIN-C;  Surgeon: Hollice Espy, MD;  Location: [facility] ORS;  Service: Urology;  Laterality: N/A;    Prior to Admission medications   Medication Sig Start Date End Date Taking? Authorizing Provider  benzonatate (TESSALON PERLES) 100 MG capsule Take 1 capsule (100 mg total) by mouth 3 (three) times daily as needed for cough. 03/16/17 03/16/18  Dustin Flock, MD  DHA-EPA-Coenzyme Q10-Vitamin E 120-180-50-30 CAPS Take 1 capsule by mouth daily.    [provider]  ferrous sulfate 325 (65 FE) MG tablet Take 325 mg by mouth daily with breakfast.    [provider]  glimepiride (AMARYL) 1 MG tablet Take 0.5 mg by mouth daily with breakfast.    [provider]  guaiFENesin-dextromethorphan (ROBITUSSIN DM) 100-10 MG/5ML syrup Take 5 mLs by mouth every 4 (four) hours as needed for cough. 03/16/17   Posey Pronto,  [place], MD  Multiple Vitamin (MULTIVITAMIN WITH MINERALS) TABS tablet Take 1 tablet by mouth daily.    [provider]  saccharomyces boulardii (FLORASTOR) 250 MG capsule Take 250 mg by mouth daily.    [provider]  Specialty Vitamins Products (ECHINACEA C COMPLETE PO) Take 1 tablet by mouth daily.    [provider]  TURMERIC PO Take 1 Dose by mouth  daily.    [provider]  VENTOLIN HFA 108 (90 Base) MCG/ACT inhaler Inhale 2 puffs into the lungs every 6 (six) hours as needed for wheezing. 03/13/17   [provider]  vitamin B-12 (CYANOCOBALAMIN) 1000 MCG tablet Take 1,000 mcg by mouth daily.     [provider]  vitamin E 400 UNIT capsule Take 400 Units by mouth daily.    [provider]    Allergies Patient has no known allergies.  Family History  Problem Relation Age of Onset  . Hepatitis Mother   . Liver disease Father   . Asthma Unknown   . Osteoporosis Unknown   . Hypertension Unknown   . Bladder Cancer Neg Hx   . Kidney cancer Neg Hx     Social History Social History   Tobacco Use  . Smoking status: Former Smoker    Types: Cigarettes  . Smokeless tobacco: Never Used  Substance Use Topics  . Alcohol use: No  . Drug use: No    Review of Systems Unable to obtain secondary to AMS  ____________________________________________   PHYSICAL EXAM:  VITAL SIGNS: ED Triage Vitals  Enc Vitals Group     BP --      Pulse Rate 05/23/17 0712 (!) 121     Resp 05/23/17 0712 18     Temp 05/23/17 0712 (!) 101.4 F (38.6 C)     Temp Source 05/23/17 0712 Axillary     SpO2 05/23/17 0712 97 %     Weight 05/23/17 0711 140 lb (63.5 kg)   Constitutional: Awake, alert. Not oriented. Eyes: Conjunctivae are normal.  ENT   Head: Normocephalic and atraumatic.   Nose: No congestion/rhinnorhea.   Mouth/Throat: Mucous membranes are moist.   Neck: No stridor. Hematological/Lymphatic/Immunilogical: No cervical lymphadenopathy. Cardiovascular: Tachycardic, regular rhythm.  No murmurs, rubs, or gallops.  Respiratory: Normal respiratory effort without tachypnea nor retractions. Breath sounds are clear and equal bilaterally. No wheezes/rales/rhonchi. Gastrointestinal: Soft and non tender. No rebound. No guarding.  Genitourinary: Deferred Musculoskeletal: Normal range of motion in all  extremities. No lower extremity edema. Neurologic: Not oriented.  Skin:  Skin is warm, dry and intact. No rash noted. ____________________________________________    LABS (pertinent positives/negatives)  Lactic 4.0 Trop 0.06 Glu 200 CBC wbc 15.5, hgb 11.8, plt 81 CMP na 131, k 3.0, glu 201, cr 0.85 UA clear, yellow, moderate urine, 21-50 RBC, 0-5 WBC, none seen bacteria ____________________________________________   EKG  I, Nance Pear, attending physician, personally viewed and interpreted this EKG  EKG Time: 0715 Rate: 123 Rhythm: sinus tachycardia Axis: normal Intervals: qtc 590 QRS: RBBB ST changes: no st elevation Impression: abnormal ekg  ____________________________________________    RADIOLOGY  CXR Possible pulmonary edema  CT head No acute pathology  ____________________________________________   PROCEDURES  Procedures   CRITICAL CARE Performed by: Nance Pear   Total critical care time: 40 minutes  Critical care time was exclusive of separately billable procedures and treating other patients.  Critical care was necessary to treat or prevent imminent or life-threatening deterioration.  Critical care was time spent  personally by me on the following activities: development of treatment plan with patient and/or surrogate as well as nursing, discussions with consultants, evaluation of patient's response to treatment, examination of patient, obtaining history from patient or surrogate, ordering and performing treatments and interventions, ordering and review of laboratory studies, ordering and review of radiographic studies, pulse oximetry and re-evaluation of patient's condition.   LUMBAR PUNCTURE  Date/Time: 05/23/2017 at 10:06 AM Performed by: Nance Pear  Consent: Verbal consent obtained. Written consent obtained. Risks and benefits: risks, benefits and alternatives were discussed Consent given by: daughter Patient  understanding: patient states understanding of the procedure being performed  Patient consent: the patient's understanding of the procedure matches consent given  Procedure consent: procedure consent matches procedure scheduled  Relevant documents: relevant documents present and verified  Test results: test results available and properly labeled Imaging studies: imaging studies available  Required items: required blood products, implants, devices, and special equipment available  Patient identity confirmed: verbally with patient and arm band  Time out: Immediately prior to procedure a "time out" was called to verify the correct patient, procedure, equipment, support staff and site/side marked as required.  Indications: fever, seizure Anesthesia: local infiltration Local anesthetic: lidocaine 1% without epinephrine Anesthetic total: 2 ml Patient sedated: ativan Preparation: Patient was prepped and draped in the usual sterile fashion. Lumbar space: L3-L4 interspace Patient's position: left lateral decubitus Needle gauge: 20 Needle length: 3.5 in Number of attempts: 1 Fluid appearance: clear Tubes of fluid: 4 Total volume: 4 ml Post-procedure: site cleaned and adhesive bandage applied Patient tolerance: Patient tolerated the procedure well with no immediate complications  ____________________________________________   INITIAL IMPRESSION / ASSESSMENT AND PLAN / ED COURSE  Pertinent labs & imaging results that were available during my care of the patient were reviewed by me and considered in my medical decision making (see chart for details).  Patient presented to the emergency department today after being found down on the floor by daughter.  Patient is a week however not oriented.  Patient was found to have fever and tachycardia upon arrival.  No obvious source of infection.  However during my initial exam the patient did have a tonic-clonic seizure.  It lasted for roughly 30 seconds.   The patient did appear to be post ictal afterwards.  Unclear then if the fever and tachycardia are secondary to seizures and that could have been what caused patient to fall in the first place.  Chest x-ray urine without obvious source of infection.  The patient's blood work otherwise unremarkable.  Did have a discussion with daughter about possibility of meningitis given fever and seizures.  Did consent daughter to undergo lumbar puncture.  Patient tolerated this procedure well.  Again no obvious source of infection however broad-spectrum antibiotics will be started.  Patient will be admitted to the hospitalist service.  ____________________________________________   FINAL CLINICAL IMPRESSION(S) / ED DIAGNOSES  Final diagnoses:  Seizure ([place])  Fall, initial encounter  Altered mental status, unspecified altered mental status type     Note: This dictation was prepared with Dragon dictation. Any transcriptional errors that result from this process are unintentional     Nance Pear, MD 05/23/17 1538

## 2017-05-23 NOTE — ED Notes (Signed)
Patient is calm at this time.  Respirations even and not labored.

## 2017-05-23 NOTE — Progress Notes (Signed)
PT Cancellation Note  Patient Details Name: Ariana Miller MRN: [id number] DOB: 06-08-1923   Cancelled Treatment:    Reason Eval/Treat Not Completed: Patient's level of consciousness.  Order received.  Chart reviewed.  Consulted RN and pt is currently not responsive to commands and unable to participate in a PT evaluation.  Will re-attempt 05/24/2017.   Roxanne Gates, PT, DPT 05/23/2017, 2:53 PM

## 2017-05-23 NOTE — Progress Notes (Addendum)
Patient with elevated troponin of unclear etiology  Left voicemail for [facility] cardiology Follow troponins Continue telemetry ASA Order echo

## 2017-05-23 NOTE — Progress Notes (Signed)
Family Meeting Note  Advance Directive:no  Today a meeting took place with the daughter.  Patient is unable to participate due XQ:JJHERD capacity Sedated   The following clinical team members were present during this meeting:MD  The following were discussed:Patient's diagnosis:seizure Sepsis  , Patient's progosis: Unable to determine and Goals for treatment: DNR  Additional follow-up to be provided: chaplain consulted ACP planning  Time spent during discussion:16 minutes  Kyndall Chaplin, MD

## 2017-05-24 DIAGNOSIS — I25708 Atherosclerosis of coronary artery bypass graft(s), unspecified, with other forms of angina pectoris: Secondary | ICD-10-CM

## 2017-05-24 DIAGNOSIS — A419 Sepsis, unspecified organism: Secondary | ICD-10-CM

## 2017-05-24 DIAGNOSIS — I214 Non-ST elevation (NSTEMI) myocardial infarction: Secondary | ICD-10-CM

## 2017-05-24 DIAGNOSIS — R569 Unspecified convulsions: Secondary | ICD-10-CM

## 2017-05-24 LAB — CBC
HEMATOCRIT: 29 % — AB (ref 35.0–47.0)
HEMOGLOBIN: 9.5 g/dL — AB (ref 12.0–16.0)
MCH: 30.9 pg (ref 26.0–34.0)
MCHC: 32.8 g/dL (ref 32.0–36.0)
MCV: 94 fL (ref 80.0–100.0)
PLATELETS: 61 10*3/uL — AB (ref 150–440)
RBC: 3.08 MIL/uL — ABNORMAL LOW (ref 3.80–5.20)
RDW: 18 % — ABNORMAL HIGH (ref 11.5–14.5)
WBC: 25.3 10*3/uL — AB (ref 3.6–11.0)

## 2017-05-24 LAB — COMPREHENSIVE METABOLIC PANEL
ALT: 21 U/L (ref 14–54)
AST: 51 U/L — ABNORMAL HIGH (ref 15–41)
Albumin: 2.7 g/dL — ABNORMAL LOW (ref 3.5–5.0)
Alkaline Phosphatase: 77 U/L (ref 38–126)
Anion gap: 9 (ref 5–15)
BUN: 17 mg/dL (ref 6–20)
CHLORIDE: 102 mmol/L (ref 101–111)
CO2: 23 mmol/L (ref 22–32)
CREATININE: 0.83 mg/dL (ref 0.44–1.00)
Calcium: 7.4 mg/dL — ABNORMAL LOW (ref 8.9–10.3)
GFR calc non Af Amer: 59 mL/min — ABNORMAL LOW (ref >60)
Glucose, Bld: 84 mg/dL (ref 65–99)
POTASSIUM: 3.1 mmol/L — AB (ref 3.5–5.1)
SODIUM: 134 mmol/L — AB (ref 135–145)
Total Bilirubin: 1 mg/dL (ref 0.3–1.2)
Total Protein: 6.8 g/dL (ref 6.5–8.1)

## 2017-05-24 LAB — GLUCOSE, CAPILLARY
GLUCOSE-CAPILLARY: 70 mg/dL (ref 65–99)
GLUCOSE-CAPILLARY: 67 mg/dL (ref 65–99)
GLUCOSE-CAPILLARY: 102 mg/dL — AB (ref 65–99)
Glucose-Capillary: 106 mg/dL — ABNORMAL HIGH (ref 65–99)

## 2017-05-24 LAB — PHOSPHORUS: Phosphorus: 3.4 mg/dL (ref 2.5–4.6)

## 2017-05-24 LAB — ECHOCARDIOGRAM COMPLETE: WEIGHTICAEL: 2430.4 [oz_av]

## 2017-05-24 LAB — PROCALCITONIN: Procalcitonin: 25.27 ng/mL

## 2017-05-24 LAB — TROPONIN I: TROPONIN I: 2.93 ng/mL — AB (ref <0.03)

## 2017-05-24 LAB — MAGNESIUM: Magnesium: 2.5 mg/dL — ABNORMAL HIGH (ref 1.7–2.4)

## 2017-05-24 LAB — HEPARIN LEVEL (UNFRACTIONATED)
Heparin Unfractionated: 0.59 IU/mL (ref 0.30–0.70)
Heparin Unfractionated: 0.4 IU/mL (ref 0.30–0.70)

## 2017-05-24 LAB — POTASSIUM: POTASSIUM: 3.3 mmol/L — AB (ref 3.5–5.1)

## 2017-05-24 MED ORDER — POTASSIUM CHLORIDE 10 MEQ/100ML IV SOLN
10.0000 meq | INTRAVENOUS | Status: AC
  Administered 2017-05-24 (×2): 10 meq via INTRAVENOUS
  Filled 2017-05-24 (×2): qty 100

## 2017-05-24 MED ORDER — POTASSIUM CHLORIDE 20 MEQ/15ML (10%) PO SOLN: 20.0000 meq | Freq: Three times a day (TID) | ORAL | Status: DC

## 2017-05-24 MED ORDER — ORAL CARE MOUTH RINSE
15.0000 mL | Freq: Two times a day (BID) | OROMUCOSAL | Status: DC
  Administered 2017-05-24 – 2017-05-28 (×7): 15 mL via OROMUCOSAL

## 2017-05-24 MED ORDER — SODIUM CHLORIDE 0.9 % IV SOLN
2.0000 g | Freq: Four times a day (QID) | INTRAVENOUS | Status: DC
  Administered 2017-05-24 – 2017-05-29 (×20): 2 g via INTRAVENOUS
  Filled 2017-05-24: qty 2000
  Filled 2017-05-24 (×3): qty 2
  Filled 2017-05-24 (×2): qty 2000
  Filled 2017-05-24: qty 2
  Filled 2017-05-24 (×5): qty 2000
  Filled 2017-05-24 (×2): qty 2
  Filled 2017-05-24: qty 2000
  Filled 2017-05-24 (×7): qty 2

## 2017-05-24 MED ORDER — POTASSIUM & SODIUM PHOSPHATES 280-160-250 MG PO PACK
1.0000 | PACK | Freq: Two times a day (BID) | ORAL | Status: DC
  Filled 2017-05-24 (×2): qty 1

## 2017-05-24 NOTE — Consult Note (Signed)
Reason for Consult:AMS Referring Physician: Mody  CC: AMS  HPI: Ariana Miller is an 84 y.o. female who is unable to provide history due to mental status.  No family available at this time therefore all history obtained from the chart.  Patient's reported that yesterday morning at approximately 6:30 AM the patient hollered out to her daughter because she fell.  It does not appear that this was a syncopal episode.  Patient was not having any symptoms prior to the fall but daughter believes it was a mechanical fall.  Patient was using her cane.  Patient daughter does not believe that the patient hit her head.  They try to get her up but she was very weak. She was brought to the ER by her family members, while in the emergency room she had a witnessed, generalized tonic-clonic seizure.  She has never had seizures in her life.  She also was noted to have a temperature of 102. Lumbar puncture was performed by ER physician.  She is started on vancomycin and ceftriaxone.   Past Medical History:  Diagnosis Date  . Anemia   . Arthritis   . Asthma   . Bladder cancer ([place])   . CAD (coronary artery disease)    a. 2-V CABG 03/2007 (LIMA-LAD, SVG-D1)  . Diabetes mellitus with complication ([place])   . Diastolic heart failure ([place])   . History of gastric ulcer   . HTN (hypertension)   . Pulmonary fibrosis ([place])   . Severe aortic stenosis    a. s/p Carpentier-Edwards AVR 03/2007; b. echo 2009 (pre-AVR): nl LVEF, mild LVH, severe AS, mild MS, trivial AR/MR/TR/PR, nl RVSF    Past Surgical History:  Procedure Laterality Date  . AORTIC VALVE REPLACEMENT (AVR)/CORONARY ARTERY BYPASS GRAFTING (CABG)    . CARDIAC VALVE REPLACEMENT    . CAROTID ENDARTERECTOMY    . CHOLECYSTECTOMY    . CORONARY ARTERY BYPASS GRAFT    . CYSTOSCOPY W/ RETROGRADES Bilateral 12/21/2015   Procedure: CYSTOSCOPY WITH RETROGRADE PYELOGRAM;  Surgeon: Hollice Espy, MD;  Location: [facility] ORS;  Service: Urology;  Laterality: Bilateral;   . EYE SURGERY Right    Catract Extraction with IOL  . TRANSURETHRAL RESECTION OF BLADDER TUMOR WITH MITOMYCIN-C N/A 12/21/2015   Procedure: TRANSURETHRAL RESECTION OF BLADDER TUMOR WITH MITOMYCIN-C;  Surgeon: Hollice Espy, MD;  Location: [facility] ORS;  Service: Urology;  Laterality: N/A;    Family History  Problem Relation Age of Onset  . Hepatitis Mother   . Liver disease Father   . Asthma Unknown   . Osteoporosis Unknown   . Hypertension Unknown   . Bladder Cancer Neg Hx   . Kidney cancer Neg Hx     Social History:  reports that she has quit smoking. Her smoking use included cigarettes. She has never used smokeless tobacco. She reports that she does not drink alcohol or use drugs.  No Known Allergies  Medications:  I have reviewed the patient's current medications. Prior to Admission:  Medications Prior to Admission  Medication Sig Dispense Refill Last Dose  . aspirin EC 81 MG tablet Take 81 mg by mouth daily.   05/21/2017 at 2000  . ferrous sulfate 325 (65 FE) MG tablet Take 325 mg by mouth daily with breakfast.   05/21/2017 at 2000  . glimepiride (AMARYL) 1 MG tablet Take 0.5 mg by mouth daily as needed (high blood sugar).    05/23/2017 at 0630  . benzonatate (TESSALON PERLES) 100 MG capsule Take 1 capsule (100 mg  total) by mouth 3 (three) times daily as needed for cough. (Patient not taking: Reported on 05/23/2017) 30 capsule 0 Completed Course at Unknown time  . guaiFENesin-dextromethorphan (ROBITUSSIN DM) 100-10 MG/5ML syrup Take 5 mLs by mouth every 4 (four) hours as needed for cough. (Patient not taking: Reported on 05/23/2017) 118 mL 0 Not Taking at Unknown time   Scheduled: . aspirin  81 mg Oral Daily  . ferrous sulfate  325 mg Oral Q breakfast  . insulin aspart  0-9 Units Subcutaneous TID WC  . mouth rinse  15 mL Mouth Rinse BID  . pneumococcal 23 valent vaccine  0.5 mL Intramuscular Tomorrow-1000    ROS: Unable to provide due to mental status  Physical  Examination: Blood pressure 131/60, pulse 88, temperature 98.7 F (37.1 C), temperature source Oral, resp. rate 16, weight 71.6 kg (157 lb 13.6 oz), SpO2 99 %.  Neurologic Exam: HEENT-  Normocephalic, no lesions, without obvious abnormality.  Normal external eye and conjunctiva.  Normal TM's bilaterally.  Normal auditory canals and external ears. Normal external nose, mucus membranes and septum.  Normal pharynx. Cardiovascular- S1, S2 normal, pulses palpable throughout   Lungs- chest clear, no wheezing, rales, normal symmetric air entry Abdomen- soft, non-tender; bowel sounds normal; no masses,  no organomegaly Extremities- no edema Lymph-no adenopathy palpable Musculoskeletal-no joint tenderness, deformity or swelling Skin-bruising on extremities  Neurological Examination   Mental Status: Lethargic but able to be awakened.  Says one or two words but dysarthric.  Does not follow commands.   Cranial Nerves: II: Blinks to bilateral confrontation III,IV, VI: ptosis not present, extra-ocular motions intact bilaterally V,VII: smile symmetric, facial light touch sensation normal bilaterally VIII: hearing normal bilaterally IX,X: unable to test XI: unable to test XII: unable to test Motor: Appropriate tone noted in all extremities but does not keep any extremity lifted against gravity Sensory: Responds to noxious stimuli throughout Deep Tendon Reflexes: 2+ with absent AJ's bilaterally Plantars: Right: upgoing   Left: downgoing Cerebellar: Unable to perform due to ability to follow commands Gait: not tested due to safety concerns  Laboratory Studies:   Basic Metabolic Panel: Recent Labs  Lab 05/23/17 0713 05/23/17 1217 05/24/17 0221 05/24/17 0502  NA 131*  --  134*  --   K 3.0*  --  3.1* 3.3*  CL 98*  --  102  --   CO2 21*  --  23  --   GLUCOSE 201*  --  84  --   BUN 16  --  17  --   CREATININE 0.85 0.94 0.83  --   CALCIUM 8.3*  --  7.4*  --   MG  --  1.3* 2.5*  --   PHOS   --  1.6*  --  3.4    Liver Function Tests: Recent Labs  Lab 05/23/17 0713 05/24/17 0221  AST 46* 51*  ALT 20 21  ALKPHOS 127* 77  BILITOT 2.1* 1.0  PROT 8.4* 6.8  ALBUMIN 3.5 2.7*   No results for input(s): LIPASE, AMYLASE in the last 168 hours. No results for input(s): AMMONIA in the last 168 hours.  CBC: Recent Labs  Lab 05/23/17 0713 05/23/17 1217 05/24/17 0221  WBC 15.5* 23.1* 25.3*  NEUTROABS 13.9*  --   --   HGB 11.8* 10.3* 9.5*  HCT 35.7 31.6* 29.0*  MCV 93.6 93.9 94.0  PLT 81* 62* 61*    Cardiac Enzymes: Recent Labs  Lab 05/23/17 0713 05/23/17 1217 05/23/17 1616 05/23/17 2231 05/24/17  1011  CKTOTAL 42  --   --   --   --   TROPONINI 0.06* 1.03* 2.00* 2.82* 2.93*    BNP: Invalid input(s): POCBNP  CBG: Recent Labs  Lab 05/23/17 1834 05/23/17 2113 05/24/17 0746 05/24/17 1210 05/24/17 1243  GLUCAP 147* 151* 70 46 106*    Microbiology: Results for orders placed or performed during the hospital encounter of 05/23/17  Culture, blood (Routine x 2)     Status: Abnormal (Preliminary result)   Collection Time: 05/23/17  7:13 AM  Result Value Ref Range Status   Specimen Description   Final    BLOOD LEFT AC Performed at [facility], [address], [place], [place] [address]    Special Requests   Final    BOTTLES DRAWN AEROBIC AND ANAEROBIC Blood Culture adequate volume Performed at [facility], [address] [place], [place] [address]    Culture  Setup Time   Final    GRAM POSITIVE COCCI IN BOTH AEROBIC AND ANAEROBIC BOTTLES CRITICAL RESULT CALLED TO, READ BACK BY AND VERIFIED WITH: JASON ROBBINS @1546  05/23/17 AKT    Culture (A)  Final    GROUP B STREP(S.AGALACTIAE)ISOLATED CULTURE REINCUBATED FOR BETTER GROWTH Performed at [facility], [place] [address], [place], [place] [address]    Report Status PENDING  Incomplete  Blood Culture ID Panel (Reflexed)     Status: Abnormal   Collection Time: 05/23/17  7:13 AM   Result Value Ref Range Status   Enterococcus species NOT DETECTED NOT DETECTED Final   Listeria monocytogenes NOT DETECTED NOT DETECTED Final   Staphylococcus species NOT DETECTED NOT DETECTED Final   Staphylococcus aureus NOT DETECTED NOT DETECTED Final   Streptococcus species DETECTED (A) NOT DETECTED Final    Comment: CRITICAL RESULT CALLED TO, READ BACK BY AND VERIFIED WITH: JASON ROBBINS @1546  05/23/17 AKT    Streptococcus agalactiae DETECTED (A) NOT DETECTED Final    Comment: CRITICAL RESULT CALLED TO, READ BACK BY AND VERIFIED WITH: JASON ROBBINS @1546  05/23/17 AKT    Streptococcus pneumoniae NOT DETECTED NOT DETECTED Final   Streptococcus pyogenes NOT DETECTED NOT DETECTED Final   Acinetobacter baumannii NOT DETECTED NOT DETECTED Final   Enterobacteriaceae species NOT DETECTED NOT DETECTED Final   Enterobacter cloacae complex NOT DETECTED NOT DETECTED Final   Escherichia coli NOT DETECTED NOT DETECTED Final   Klebsiella oxytoca NOT DETECTED NOT DETECTED Final   Klebsiella pneumoniae NOT DETECTED NOT DETECTED Final   Proteus species NOT DETECTED NOT DETECTED Final   Serratia marcescens NOT DETECTED NOT DETECTED Final   Haemophilus influenzae NOT DETECTED NOT DETECTED Final   Neisseria meningitidis NOT DETECTED NOT DETECTED Final   Pseudomonas aeruginosa NOT DETECTED NOT DETECTED Final   Candida albicans NOT DETECTED NOT DETECTED Final   Candida glabrata NOT DETECTED NOT DETECTED Final   Candida krusei NOT DETECTED NOT DETECTED Final   Candida parapsilosis NOT DETECTED NOT DETECTED Final   Candida tropicalis NOT DETECTED NOT DETECTED Final    Comment: Performed at [facility], [address] [place], [place] [address]  Culture, blood (Routine x 2)     Status: None (Preliminary result)   Collection Time: 05/23/17  7:45 AM  Result Value Ref Range Status   Specimen Description   Final    BLOOD RIGHT HAND Performed at [facility], [address]  [place], [place] [address]    Special Requests   Final    BOTTLES DRAWN AEROBIC AND ANAEROBIC  Blood Culture results may not be optimal due to an excessive volume of blood received in culture bottles Performed at [facility], [address] [place], [place] [address]    Culture  Setup Time   Final    GRAM POSITIVE COCCI IN BOTH AEROBIC AND ANAEROBIC BOTTLES CRITICAL RESULT CALLED TO, READ BACK BY AND VERIFIED WITH: JASON ROBBINS @1546  05/23/17 AKT Performed at [facility], [place] [address], [place], [place] [address]    Culture GRAM POSITIVE COCCI  Final   Report Status PENDING  Incomplete  CSF culture     Status: None (Preliminary result)   Collection Time: 05/23/17  9:57 AM  Result Value Ref Range Status   Specimen Description   Final    CSF Performed at [facility], [address], [place], [place] [address]    Special Requests   Final    CSF Performed at [facility], [place], [place] [address]    Gram Stain   Final    NO ORGANISMS SEEN RARE WBC RARE RBC Performed at [facility], [address], [place], [place] [address]    Culture   Final    NO GROWTH < 24 HOURS Performed at [facility], [place] [address] [place], [place] [address]    Report Status PENDING  Incomplete    Coagulation Studies: Recent Labs    05/23/17 0713 05/23/17 1814  LABPROT 15.1 16.1*  INR 1.20 1.30    Urinalysis:  Recent Labs  Lab 05/23/17 0820  COLORURINE YELLOW*  LABSPEC 1.009  PHURINE 6.0  GLUCOSEU NEGATIVE  HGBUR MODERATE*  BILIRUBINUR NEGATIVE  KETONESUR NEGATIVE  PROTEINUR 100*  NITRITE NEGATIVE  LEUKOCYTESUR NEGATIVE    Lipid Panel:     Component Value Date/Time   CHOL 139 10/19/2015 1044   TRIG 62 10/19/2015 1044   HDL 48 10/19/2015 1044   CHOLHDL 2.9 10/19/2015 1044   VLDL 12 10/19/2015 1044   LDLCALC 79 10/19/2015 1044    HgbA1C:  Lab Results  Component Value Date   HGBA1C 5.4 05/23/2017     Urine Drug Screen:  No results found for: LABOPIA, COCAINSCRNUR, LABBENZ, AMPHETMU, THCU, LABBARB  Alcohol Level: No results for input(s): ETH in the last 168 hours.  Other results: EKG: sinus tachycardia at 123 bpm.  Imaging: Dg Chest 2 View  Result Date: 05/23/2017 CLINICAL DATA:  Found on floor, unable to get up on her own, history of stroke with LEFT-sided weakness, dementia, former smoker EXAM: CHEST - 2 VIEW COMPARISON:  03/14/2017 FINDINGS: Upper normal heart size post CABG and AVR. Atherosclerotic calcification aorta. Peribronchial thickening with diffuse interstitial changes likely representing pulmonary edema, little changed. Slight decrease in lung volumes since previous exam. No pleural effusion or pneumothorax. Marked osseous demineralization with nonunion of an old proximal LEFT humeral fracture and prior RIGHT shoulder replacement. IMPRESSION: Diffuse interstitial infiltrates likely representing pulmonary edema, little changed. Electronically Signed   By: Lavonia Dana M.D.   On: 05/23/2017 08:05   Ct Head Wo Contrast  Result Date: 05/23/2017 CLINICAL DATA:  Head trauma with ataxia.  Initial encounter. EXAM: CT HEAD WITHOUT CONTRAST TECHNIQUE: Contiguous axial images were obtained from the base of the skull through the vertex without intravenous contrast. COMPARISON:  10/20/2015 FINDINGS: Brain: No evidence of acute infarction, hemorrhage, hydrocephalus, extra-axial collection or mass lesion/mass effect. Left frontal cortically based calcification, chronic and incidental. Age normal brain volume and white matter appearance Vascular: No hyperdense vessel  or unexpected calcification. Skull: Normal. Negative for fracture or focal lesion. Sinuses/Orbits: No acute finding. IMPRESSION: No evidence of injury.  Age normal head CT. Electronically Signed   By: Monte Fantasia M.D.   On: 05/23/2017 07:58   Mr Brain Wo Contrast  Result Date: 05/23/2017 CLINICAL DATA:  Altered level of  consciousness, unexplained EXAM: MRI HEAD WITHOUT CONTRAST TECHNIQUE: Multiplanar, multiecho pulse sequences of the brain and surrounding structures were obtained without intravenous contrast. COMPARISON:  Head CT from earlier today FINDINGS: Severely motion degraded sagittal T1, diffusion, axial T2, axial FLAIR was obtained. No restricted diffusion seen on both axial and coronal scans. Major flow voids are preserved. There is no hydrocephalus or midline shift. The study was terminated early due to concerns for patient's safety. IMPRESSION: 1. No acute finding. 2. Partial and severely motion degraded study due to altered mental status. Electronically Signed   By: Monte Fantasia M.D.   On: 05/23/2017 15:44     Assessment/Plan: 84 year old female with altered mental status, seizures and fever.  Currently on Vanc and Omnipen.  Initial LP not suggestive of a bacterial meningitis.  Can not rule out the possibility of a herpes encephalitis but fever seems to be responding to current antibiotic regimen which makes this less likely.  EEG unremarkable.  MRI of the brain shows no acute changes.  Source of infection remains unclear but mental status may very well be related to such, no matter where the source, particularly in a patient of this age.     Recommendations: 1. Agree with continued antibiotics 2. CSF sent for herpes titer 3. Ativan prn seizure activity   LOS: 1 day   Alexis Goodell, MD Neurology [phone] 05/24/2017  9:40 AM

## 2017-05-24 NOTE — Progress Notes (Signed)
Inpatient Diabetes Program Recommendations  AACE/ADA: New Consensus Statement on Inpatient Glycemic Control (2015)  Target Ranges:  Prepandial:   less than 140 mg/dL      Peak postprandial:   less than 180 mg/dL (1-2 hours)      Critically ill patients:  140 - 180 mg/dL   Lab Results  Component Value Date   GLUCAP 70 05/24/2017   HGBA1C 5.4 05/23/2017    Review of Glycemic ControlResults for MILYNN, QUIRION (MRN [id number]) as of 05/24/2017 10:46  Ref. Range 05/23/2017 07:16 05/23/2017 13:27 05/23/2017 18:34 05/23/2017 21:13 05/24/2017 07:46  Glucose-Capillary Latest Ref Range: 65 - 99 mg/dL 200 (H) 119 (H) 147 (H) 151 (H) 70    Diabetes history: DM 2 Outpatient Diabetes medications: Amaryl 1 mg daily Current orders for Inpatient glycemic control:  Novolog sensitive tid with meals  Inpatient Diabetes Program Recommendations:   Referral received.  Agree with current orders. Will follow.  Thanks,  Adah Perl, RN, BC-ADM Inpatient Diabetes Coordinator Pager [phone] (8a-5p)

## 2017-05-24 NOTE — Evaluation (Addendum)
Clinical/Bedside Swallow Evaluation Patient Details  Name: Ariana Miller MRN: [id number] Date of Birth: 1923-12-14  Today's Date: 05/24/2017 Time: SLP Start Time (ACUTE ONLY): 1215 SLP Stop Time (ACUTE ONLY): 1315 SLP Time Calculation (min) (ACUTE ONLY): 60 min  Past Medical History:  Past Medical History:  Diagnosis Date  . Anemia   . Arthritis   . Asthma   . Bladder cancer ([place])   . CAD (coronary artery disease)    a. 2-V CABG 03/2007 (LIMA-LAD, SVG-D1)  . Diabetes mellitus with complication ([place])   . Diastolic heart failure ([place])   . History of gastric ulcer   . HTN (hypertension)   . Pulmonary fibrosis ([place])   . Severe aortic stenosis    a. s/p Carpentier-Edwards AVR 03/2007; b. echo 2009 (pre-AVR): nl LVEF, mild LVH, severe AS, mild MS, trivial AR/MR/TR/PR, nl RVSF   Past Surgical History:  Past Surgical History:  Procedure Laterality Date  . AORTIC VALVE REPLACEMENT (AVR)/CORONARY ARTERY BYPASS GRAFTING (CABG)    . CARDIAC VALVE REPLACEMENT    . CAROTID ENDARTERECTOMY    . CHOLECYSTECTOMY    . CORONARY ARTERY BYPASS GRAFT    . CYSTOSCOPY W/ RETROGRADES Bilateral 12/21/2015   Procedure: CYSTOSCOPY WITH RETROGRADE PYELOGRAM;  Surgeon: Hollice Espy, MD;  Location: [facility] ORS;  Service: Urology;  Laterality: Bilateral;  . EYE SURGERY Right    Catract Extraction with IOL  . TRANSURETHRAL RESECTION OF BLADDER TUMOR WITH MITOMYCIN-C N/A 12/21/2015   Procedure: TRANSURETHRAL RESECTION OF BLADDER TUMOR WITH MITOMYCIN-C;  Surgeon: Hollice Espy, MD;  Location: [facility] ORS;  Service: Urology;  Laterality: N/A;   HPI:  Pt is a 84 y.o. female with a known history of multiple medical issues including Dementia chronic anemia, bladder cancer, CAD/CABG, diabetes and chronic diastolic heart failure with preserved ejection fraction who presents to the emergency room due to a fall and generalized weakness. No focal neurological deficits were noted.  She was brought to the ER by her family  members, while in the emergency room she had a witnessed 32nd generalized tonic-clonic seizure in the emergency room.  She has never had seizures in her life.  She also was noted to have a temperature of 102. Patient was lethargic after being given Ativan for her seizure. Currently, more awake w/ verbal cues. Pt had been eating a little lunch w/ Dtr in room. Dtr reported pt eats soft foods at home AND wears her dentures.    Assessment / Plan / Recommendation Clinical Impression  Pt appears to present w/ adequate oropharyngeal phase swallow function w/ trials of thin liquids and Purees given at this assessment today; pt does present w/ mild increased risk for aspiration secondary to Edentulous status and drowsiness. Recommend a Dysphagia level 1(puree) w/ thin liquids diet w/ aspiration precautions and full monitoring at meals to support w/ feeding and check for oral clearing. Recommend Pills in Puree - Crushed if needed for safer, easier swallowing. Pt should practice wearing her dentures BEFORE wearing them for mastication/eating at meals. This may be something pt and Dtr will work on at home when pt feels better. Education given to Dtr on need to mash foods to soften (in place of dentition); aspiration precautions. Handouts given. SLP Visit Diagnosis: Dysphagia, oropharyngeal phase (R13.12)    Aspiration Risk  Mild aspiration risk(but reduced following precautions)    Diet Recommendation  Dysphagia level 1 (puree) w/ Thin liquids; general aspiration precautions; feeding support at meals. MUST be fully awake/alert w/ oral intake.  Medication Administration:  Crushed with puree(as able to or in liquid/chewable forms)    Other  Recommendations Recommended Consults: (Dietician f/u for support) Oral Care Recommendations: Oral care BID;Staff/trained caregiver to provide oral care Other Recommendations: (n/a)   Follow up Recommendations None      Frequency and Duration (n/a)  (n/a)        Prognosis Prognosis for Safe Diet Advancement: Fair(-Good) Barriers to Reach Goals: Cognitive deficits(baseline Dementia)      Swallow Study   General Date of Onset: 05/23/17 HPI: Pt is a 84 y.o. female with a known history of multiple medical issues including Dementia chronic anemia, bladder cancer, CAD/CABG, diabetes and chronic diastolic heart failure with preserved ejection fraction who presents to the emergency room due to a fall and generalized weakness. No focal neurological deficits were noted.  She was brought to the ER by her family members, while in the emergency room she had a witnessed 32nd generalized tonic-clonic seizure in the emergency room.  She has never had seizures in her life.  She also was noted to have a temperature of 102. Patient was lethargic after being given Ativan for her seizure. Currently, more awake w/ verbal cues. Pt had been eating a little lunch w/ Dtr in room. Dtr reported pt eats soft foods at home AND wears her dentures.  Type of Study: Bedside Swallow Evaluation Previous Swallow Assessment: none reported Diet Prior to this Study: Regular;Thin liquids(at home as well) Temperature Spikes Noted: No(wbc 25.3; temp 99.2) Respiratory Status: Room air History of Recent Intubation: No Behavior/Cognition: Cooperative;Pleasant mood;Confused;Distractible;Requires cueing;Lethargic/Drowsy Oral Cavity Assessment: Within Functional Limits Oral Care Completed by SLP: Recent completion by staff Oral Cavity - Dentition: Edentulous(not wearing her dentures) Vision: (n/a) Self-Feeding Abilities: Total assist(sleepy; baseline Dementia) Patient Positioning: Upright in bed Baseline Vocal Quality: Low vocal intensity(mumbled speech) Volitional Cough: Cognitively unable to elicit Volitional Swallow: Unable to elicit    Oral/Motor/Sensory Function Overall Oral Motor/Sensory Function: (appeared wfl w/ bolus management )   Ice Chips Ice chips: Within functional  limits Presentation: Spoon(2 trials)   Thin Liquid Thin Liquid: Within functional limits Presentation: Straw(~9-10 trials)    Nectar Thick Nectar Thick Liquid: Not tested   Honey Thick Honey Thick Liquid: Not tested   Puree Puree: Within functional limits Presentation: Spoon(fed; ~3 ozs)   Solid   GO   Solid: Not tested Other Comments: edentulous         Orinda Kenner, MS, CCC-SLP Bradie Sangiovanni 05/24/2017,4:09 PM

## 2017-05-24 NOTE — Progress Notes (Addendum)
[facility] [place] NAME: Ariana Miller    MR#:  [id number]  DATE OF BIRTH:  [birth year]  SUBJECTIVE:  Patient seen and evaluated today Is awake and responds to verbal commands But not completely oriented Not much history could be obtained from the patient   REVIEW OF SYSTEMS:    ROS Could not be obtained completely secondary to lethargy and confusion   DRUG ALLERGIES:  No Known Allergies  VITALS:  Blood pressure (!) 127/55, pulse 92, temperature 99.2 F (37.3 C), temperature source Oral, resp. rate 19, weight 71.6 kg (157 lb 13.6 oz), SpO2 96 %.  PHYSICAL EXAMINATION:   Physical Exam  GENERAL:  84 y.o.-year-old elderly patient lying in the bed with no acute distress.  EYES: Pupils equal, round, reactive to light and accommodation. No scleral icterus. Extraocular muscles intact.  HEENT: Head atraumatic, normocephalic. Oropharynx and nasopharynx clear.  NECK:  Supple, no jugular venous distention. No thyroid enlargement, no tenderness.  LUNGS: Normal breath sounds bilaterally, no wheezing, rales, rhonchi. No use of accessory muscles of respiration.  CARDIOVASCULAR: S1, S2 normal. No murmurs, rubs, or gallops.  ABDOMEN: Soft, nontender, nondistended. Bowel sounds present. No organomegaly or mass.  EXTREMITIES: No cyanosis, clubbing or edema b/l.    NEUROLOGIC: Awake, arousable to loud verbal commands Moves all extremities PSYCHIATRIC: Could not be assessed SKIN: No obvious rash, lesion, or ulcer.   LABORATORY PANEL:   CBC Recent Labs  Lab 05/24/17 0221  WBC 25.3*  HGB 9.5*  HCT 29.0*  PLT 61*   ------------------------------------------------------------------------------------------------------------------ Chemistries  Recent Labs  Lab 05/24/17 0221 05/24/17 0502  NA 134*  --   K 3.1* 3.3*  CL 102  --   CO2 23  --   GLUCOSE 84  --   BUN 17  --   CREATININE 0.83  --   CALCIUM 7.4*  --   MG 2.5*  --   AST 51*   --   ALT 21  --   ALKPHOS 77  --   BILITOT 1.0  --    ------------------------------------------------------------------------------------------------------------------  Cardiac Enzymes Recent Labs  Lab 05/24/17 1011  TROPONINI 2.93*   ------------------------------------------------------------------------------------------------------------------  RADIOLOGY:  Dg Chest 2 View  Result Date: 05/23/2017 CLINICAL DATA:  Found on floor, unable to get up on her own, history of stroke with LEFT-sided weakness, dementia, former smoker EXAM: CHEST - 2 VIEW COMPARISON:  03/14/2017 FINDINGS: Upper normal heart size post CABG and AVR. Atherosclerotic calcification aorta. Peribronchial thickening with diffuse interstitial changes likely representing pulmonary edema, little changed. Slight decrease in lung volumes since previous exam. No pleural effusion or pneumothorax. Marked osseous demineralization with nonunion of an old proximal LEFT humeral fracture and prior RIGHT shoulder replacement. IMPRESSION: Diffuse interstitial infiltrates likely representing pulmonary edema, little changed. Electronically Signed   By: Lavonia Dana M.D.   On: 05/23/2017 08:05   Ct Head Wo Contrast  Result Date: 05/23/2017 CLINICAL DATA:  Head trauma with ataxia.  Initial encounter. EXAM: CT HEAD WITHOUT CONTRAST TECHNIQUE: Contiguous axial images were obtained from the base of the skull through the vertex without intravenous contrast. COMPARISON:  10/20/2015 FINDINGS: Brain: No evidence of acute infarction, hemorrhage, hydrocephalus, extra-axial collection or mass lesion/mass effect. Left frontal cortically based calcification, chronic and incidental. Age normal brain volume and white matter appearance Vascular: No hyperdense vessel or unexpected calcification. Skull: Normal. Negative for fracture or focal lesion. Sinuses/Orbits: No acute finding. IMPRESSION: No evidence of injury.  Age normal head  CT. Electronically Signed    By: Monte Fantasia M.D.   On: 05/23/2017 07:58   Mr Brain Wo Contrast  Result Date: 05/23/2017 CLINICAL DATA:  Altered level of consciousness, unexplained EXAM: MRI HEAD WITHOUT CONTRAST TECHNIQUE: Multiplanar, multiecho pulse sequences of the brain and surrounding structures were obtained without intravenous contrast. COMPARISON:  Head CT from earlier today FINDINGS: Severely motion degraded sagittal T1, diffusion, axial T2, axial FLAIR was obtained. No restricted diffusion seen on both axial and coronal scans. Major flow voids are preserved. There is no hydrocephalus or midline shift. The study was terminated early due to concerns for patient's safety. IMPRESSION: 1. No acute finding. 2. Partial and severely motion degraded study due to altered mental status. Electronically Signed   By: Monte Fantasia M.D.   On: 05/23/2017 15:44     ASSESSMENT AND PLAN:  Ariana Miller  is a 84 y.o. female with a known history of mild dementia chronic anemia, bladder cancer, CAD/CABG, diabetes and chronic diastolic heart failure with preserved ejection fraction who presents to the emergency room due to a fall and generalized weakness.  -Sepsis Continue IV vancomycin antibiotic Continue IV ampicillin ABX Discontinue ceftriaxone   -Staph aureus and streptococcal B Agalactiae bacteremia Continue IV vancomycin and start IV Zosyn antibiotic F/U sentivities  -Meningitis ruled out CSF panel negative so far Follow-up CSF culture  -Non-STEMI CAD/CABG Elevated troponin secondary to bacteremia and sepsis Heparin drip for 48 hours Aspirin and Plavix Follow-up echocardiogram Appreciate cardiology evaluation Not a candidate for invasive cardiac testing considering advanced age and comorbidities  -Hypokalemia Replace potassium  Anemia of chronic disease- Monitor hemoglobin hematocrit  -Chronic diastolic heart failure Watch for fluid overload  -Seizure Follow-up EEG MRI brain no abnormality Follow-up  neurology consultation  -Metabolic encephalopathy secondary to sepsis Treat underlying infection    All the records are reviewed and case discussed with Care Management/Social Worker. Management plans discussed with the patient, family and they are in agreement.  CODE STATUS: DNR  DVT Prophylaxis: SCDs  TOTAL TIME TAKING CARE OF THIS PATIENT: 35 minutes.   POSSIBLE D/C IN 3 to 4 DAYS, DEPENDING ON CLINICAL CONDITION.  Saundra Shelling M.D on 05/24/2017 at 11:57 AM  Between 7am to 6pm - Pager - [phone]  After 6pm go to [email or web address] - password [email or web address] [facility]  Office  [phone]  CC: Primary care physician; Rusty Aus, MD  Note: This dictation was prepared with Dragon dictation along with smaller phrase technology. Any transcriptional errors that result from this process are unintentional.

## 2017-05-24 NOTE — Plan of Care (Signed)

## 2017-05-24 NOTE — Progress Notes (Signed)
Pharmacy Electrolyte Monitoring Consult:  Pharmacy consulted to assist in monitoring and replacing electrolytes in this 84 y.o. female admitted on 05/23/2017 with witnessed seizure and being treated for sepsis/possible meningitis.   Patient currently ordered NS at 60mL/hr.   Labs:  Sodium (mmol/L)  Date Value  05/24/2017 134 (L)  01/05/2012 135 (L)   Potassium (mmol/L)  Date Value  05/24/2017 3.3 (L)  01/05/2012 3.8   Magnesium (mg/dL)  Date Value  05/24/2017 2.5 (H)   Phosphorus (mg/dL)  Date Value  05/24/2017 3.4   Calcium (mg/dL)  Date Value  05/24/2017 7.4 (L)   Calcium, Total (mg/dL)  Date Value  01/05/2012 7.8 (L)   Albumin (g/dL)  Date Value  05/24/2017 2.7 (L)  01/05/2012 3.3 (L)    Assessment/Plan: Potassium 15mEq IV Q1hr x 3 doses. Will change MIVF to NS/48mEq KCL at 66mL/hr. Magnesium and phosphorus levels ordered. Goal potassium ~ 4 and goal magnesium ~ 2 in setting seizure.   5/21 @ 1217:  Mag= 1.3,  Phos= 1.6. Will order Magnesium sulfate 4 gram IV x 1. Will order Potassium Phosphate 15 mmol IV x 1. Will recheck Phos and K at 2300 and  Mag with am labs  Addendum:  Pt went to MRI and electrolytes not hung. Rescheduled Mag and KPhos IVs to 1600 and rescheduled labs.  Pharmacy will continue to monitor and adjust per consult.   05/24/17 05:02 K 3.3, phos 3.4. Patient received IV supplements yesterday but today has diet ordered. Will order potassium chloride 10% oral solution 20 mEq po TID with meals x 3 doses and phos-nak packets 1 packet po BID with meals x 2 doses. Recheck all electrolytes tomorrow with AM labs.  Addendum: RN reports that patient had lorazepam and is now too lethargic for oral supplementation. Will discontinue oral KCl and phos-nak orders. Will order KCl 78mEq IV x 2.  Paulina Fusi, PharmD, BCPS 05/24/2017 10:13 AM

## 2017-05-24 NOTE — Consult Note (Signed)
Cardiology Consultation:   Patient ID: Ariana Miller; [id number]; [birth year]   Admit date: 05/23/2017 Date of Consult: 05/24/2017  Primary Care Provider: Rusty Aus, MD Primary Cardiologist: Ariana Miller, never seen in the office   Patient Profile:   Ariana Miller is a 84 y.o. female with a hx of CAD s/p 2-vessel CABG in 03/2007 with LIMA-LAD and SVG-D1, severe aortic stenosis s/p Carpentier-Edwards AVR at the time of her bypass in 03/2007, pulmonary fibrosis, carotid artery disease s/p left CEA, DM2, bladder cancer s/p TURBT, asthma, iron deficiency anemia, dementia, HLD, HTN, and diverticulitis who is being seen today for the evaluation of elevated troponin at the request of Dr. Benjie Karvonen, MD.  History of Present Illness:   Ariana Miller has not regularly seen cardiology since her surgery in 2009. 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 Troponin peaked at 0.09. TTE showed an EF of 60-65%, no RWMA, Gr2DD, bileaflet aortic valve prosthesis was noted with very mild stenosis, mild mitral stenosis, moderately dilated left atrium, RVSF normal, PASP 42 mmHg. Treatment of her URI was recommended with gentle diuresis. There was no indication for ischemic evaluation given her advanced age, dementia, lack of cardiac symptoms concerning for angina, non-acute EKG and reassuring echo. She has not followed up with cardiology since.   Patient was brought the hospital on 5/21 for what was felt to be a mechanical fall. While in the ED, the patient had a tonic-clonic seizure and was noted to be febrile at 102. LP was performed and she was placed on empiric vancomycin and Rocephin. CT head was not acute. CXR showed diffuse interstitial infiltrates. BP has ranged from 585 to 929 systolic. Troponin 0.06-->1.03-->2.00-->2.82. CK 42. Lactic acid 4.0-->4.4-->3.3. WBC  15.5-->23.1-->25.3, HGB 11.8-->10.3-->9.5, PLT 81-->62-->61. Na 131, K+ 3.0-->3.1-->3.3, glucose 201, SCr 0.85, AST 46, alk phos 127, t bili 2.1. CST cell count tube #1 with 43 RBC, 0 WBC; tube #4 with 0 RBC, 7 WBC, CSF glucose 80, CSF protein 33, CSF Gram stain negative for organisms with rare WBC/RBC, magnesium 1.3-->2.5, PCT 14.49-->25.27, TSH 2.157. CSF culture, blood culture growing strep x 1. Cardiology asked to evaluate the mildly elevated troponin in the above patient. She has been started on a heparin gtt. Echo is pending.   Note is taken from prior documentation as patient has underlying dementia and the daughter has stepped away for a doctor's appointment.   Past Medical History:  Diagnosis Date  . Anemia   . Arthritis   . Asthma   . Bladder cancer (Ariana Miller)   . CAD (coronary artery disease)    a. 2-V CABG 03/2007 (LIMA-LAD, SVG-D1)  . Diabetes mellitus with complication ([place])   . Diastolic heart failure ([place])   . History of gastric ulcer   . HTN (hypertension)   . Pulmonary fibrosis ([place])   . Severe aortic stenosis    a. s/p Carpentier-Edwards AVR 03/2007; b. echo 2009 (pre-AVR): nl LVEF, mild LVH, severe AS, mild MS, trivial AR/MR/TR/PR, nl RVSF    Past Surgical History:  Procedure Laterality Date  . AORTIC VALVE REPLACEMENT (AVR)/CORONARY ARTERY BYPASS GRAFTING (CABG)    . CARDIAC VALVE REPLACEMENT    . CAROTID ENDARTERECTOMY    . CHOLECYSTECTOMY    . CORONARY ARTERY BYPASS GRAFT    . CYSTOSCOPY W/ RETROGRADES Bilateral 12/21/2015   Procedure: CYSTOSCOPY WITH  RETROGRADE PYELOGRAM;  Surgeon: Ariana Espy, MD;  Location: [facility] ORS;  Service: Urology;  Laterality: Bilateral;  . EYE SURGERY Right    Ariana Miller  . TRANSURETHRAL RESECTION OF BLADDER TUMOR WITH MITOMYCIN-C N/A 12/21/2015   Procedure: TRANSURETHRAL RESECTION OF BLADDER TUMOR WITH MITOMYCIN-C;  Surgeon: Ariana Espy, MD;  Location: [facility] ORS;  Service: Urology;  Laterality: N/A;     Home  Meds: Prior to Admission medications   Medication Sig Start Date End Date Taking? Authorizing Provider  aspirin EC 81 MG tablet Take 81 mg by mouth daily.   Yes [provider]  ferrous sulfate 325 (65 FE) MG tablet Take 325 mg by mouth daily with breakfast.   Yes [provider]  glimepiride (AMARYL) 1 MG tablet Take 0.5 mg by mouth daily as needed (high blood sugar).    Yes [provider]  benzonatate (TESSALON PERLES) 100 MG capsule Take 1 capsule (100 mg total) by mouth 3 (three) times daily as needed for cough. Patient not taking: Reported on 05/23/2017 03/16/17 03/16/18  Ariana Flock, MD  guaiFENesin-dextromethorphan [facility] DM) 100-10 MG/5ML syrup Take 5 mLs by mouth every 4 (four) hours as needed for cough. Patient not taking: Reported on 05/23/2017 03/16/17   Ariana Flock, MD    Inpatient Medications: Scheduled Meds: . aspirin  81 mg Oral Daily  . ferrous sulfate  325 mg Oral Q breakfast  . insulin aspart  0-9 Units Subcutaneous TID WC  . pneumococcal 23 valent vaccine  0.5 mL Intramuscular Tomorrow-1000  . potassium & sodium phosphates  1 packet Oral BID WC  . potassium chloride  20 mEq Oral TID WC   Continuous Infusions: . 0.9 % NaCl with KCl 20 mEq / L 50 mL/hr at 05/24/17 0419  . ampicillin (OMNIPEN) IV Stopped (05/24/17 0546)  . cefTRIAXone (ROCEPHIN)  IV Stopped (05/23/17 2350)  . heparin 850 Units/hr (05/24/17 0418)  . vancomycin Stopped (05/23/17 2341)   PRN Meds: acetaminophen **OR** acetaminophen, ibuprofen, LORazepam, ondansetron **OR** ondansetron (ZOFRAN) IV, polyethylene glycol  Allergies:  No Known Allergies  Social History:   Social History   Socioeconomic History  . Marital status: Single    Spouse name: Not on file  . Number of children: Not on file  . Years of education: Not on file  . Highest education level: Not on file  Occupational History  . Not on file  Social Needs  . Financial resource strain: Not on  file  . Food insecurity:    Worry: Not on file    Inability: Not on file  . Transportation needs:    Medical: Not on file    Non-medical: Not on file  Tobacco Use  . Smoking status: Former Smoker    Types: Cigarettes  . Smokeless tobacco: Never Used  Substance and Sexual Activity  . Alcohol use: No  . Drug use: No  . Sexual activity: Not Currently  Lifestyle  . Physical activity:    Days per week: Not on file    Minutes per session: Not on file  . Stress: Not on file  Relationships  . Social connections:    Talks on phone: Not on file    Gets together: Not on file    Attends religious service: Not on file    Active member of club or organization: Not on file    Attends meetings of clubs or organizations: Not on file    Relationship status: Not on file  . Intimate partner violence:  Fear of current or ex partner: Not on file    Emotionally abused: Not on file    Physically abused: Not on file    Forced sexual activity: Not on file  Other Topics Concern  . Not on file  Social History Narrative  . Not on file     Family History:   Family History  Problem Relation Age of Onset  . Hepatitis Mother   . Liver disease Father   . Asthma Unknown   . Osteoporosis Unknown   . Hypertension Unknown   . Bladder Cancer Neg Hx   . Kidney cancer Neg Hx     ROS:  Review of Systems  Unable to perform ROS: Dementia      Physical Exam/Data:   Vitals:   05/23/17 1156 05/23/17 1830 05/23/17 2024 05/24/17 0419  BP: 129/62 (!) 122/95 (!) 134/48 (!) 127/55  Pulse: 82 82 75 92  Resp: 18 18 16 19   Temp: 98.1 F (36.7 C) (!) 97.5 F (36.4 C) 97.8 F (36.6 C) 99.2 F (37.3 C)  TempSrc: Oral Oral Oral Oral  SpO2: 96% 94% 97% 96%  Weight: 151 lb 14.4 oz (68.9 kg)   157 lb 13.6 oz (71.6 kg)    Intake/Output Summary (Last 24 hours) at 05/24/2017 0921 Last data filed at 05/24/2017 0418 Gross per 24 hour  Intake 1375.56 ml  Output 50 ml  Net 1325.56 ml   Filed Weights    05/23/17 0711 05/23/17 1156 05/24/17 0419  Weight: 140 lb (63.5 kg) 151 lb 14.4 oz (68.9 kg) 157 lb 13.6 oz (71.6 kg)   Body mass index is 30.83 kg/m.   Physical Exam: General: Elderly and frail appearing, in no acute distress. Head: Normocephalic, atraumatic, sclera non-icteric, no xanthomas, nares without discharge.  Neck: Negative for carotid bruits. JVD not elevated. Lungs: Clear bilaterally to auscultation without wheezes, rales, or rhonchi. Breathing is unlabored. Heart: RRR with S1 S2. II/VI systolic murmur RSUB, no rubs, or gallops appreciated. Abdomen: Soft, non-tender, non-distended with normoactive bowel sounds. No hepatomegaly. No rebound/guarding. No obvious abdominal masses. Msk:  Strength and tone appear normal for age. Extremities: No clubbing or cyanosis. No edema. Distal pedal pulses are 2+ and equal bilaterally. Neuro: Alert and oriented X 3. No facial asymmetry. No focal deficit. Moves all extremities spontaneously. Psych:  Responds to questions appropriately with a normal affect.   EKG:  The EKG was personally reviewed and demonstrates: sinus tachycardia, 123 bpm, RBBB Telemetry:  Telemetry was personally reviewed and demonstrates: NSR  Weights: Filed Weights   05/23/17 0711 05/23/17 1156 05/24/17 0419  Weight: 140 lb (63.5 kg) 151 lb 14.4 oz (68.9 kg) 157 lb 13.6 oz (71.6 kg)    Relevant CV Studies: TTE 03/2017: Study Conclusions  - Left ventricle: The cavity size was normal. Systolic function was   normal. The estimated ejection fraction was in the range of 60%   to 65%. Wall motion was normal; there were no regional wall   motion abnormalities. Features are consistent with a pseudonormal   left ventricular filling pattern, with concomitant abnormal   relaxation and increased filling pressure (grade 2 diastolic   dysfunction). - Aortic valve: A bileaflet prosthesis was present. There was very   mild stenosis. Peak velocity (S): 204 cm/s. Mean gradient  (S): 10   mm Hg. Peak gradient (S): 17 mm Hg. Valve area (VTI): 1.83 cm^2. - Mitral valve: Calcified annulus. The findings are consistent with   mild stenosis. Mean gradient (D): 8  mm Hg. Peak gradient (D): 11   mm Hg. Valve area by continuity equation (using LVOT flow): 1.38   cm^2. - Left atrium: The atrium was moderately dilated. - Right ventricle: Systolic function was normal. - Pulmonary arteries: Systolic pressure was mildly elevated. PA   peak pressure: 42 mm Hg (S).  Laboratory Data:  Chemistry Recent Labs  Lab 05/23/17 0713 05/23/17 1217 05/24/17 0221 05/24/17 0502  NA 131*  --  134*  --   K 3.0*  --  3.1* 3.3*  CL 98*  --  102  --   CO2 21*  --  23  --   GLUCOSE 201*  --  84  --   BUN 16  --  17  --   CREATININE 0.85 0.94 0.83  --   CALCIUM 8.3*  --  7.4*  --   GFRNONAA 58* 51* 59*  --   GFRAA >60 59* >60  --   ANIONGAP 12  --  9  --     Recent Labs  Lab 05/23/17 0713 05/24/17 0221  PROT 8.4* 6.8  ALBUMIN 3.5 2.7*  AST 46* 51*  ALT 20 21  ALKPHOS 127* 77  BILITOT 2.1* 1.0   Hematology Recent Labs  Lab 05/23/17 0713 05/23/17 1217 05/24/17 0221  WBC 15.5* 23.1* 25.3*  RBC 3.82 3.36* 3.08*  HGB 11.8* 10.3* 9.5*  HCT 35.7 31.6* 29.0*  MCV 93.6 93.9 94.0  MCH 31.0 30.7 30.9  MCHC 33.1 32.7 32.8  RDW 17.9* 17.9* 18.0*  PLT 81* 62* 61*   Cardiac Enzymes Recent Labs  Lab 05/23/17 0713 05/23/17 1217 05/23/17 1616 05/23/17 2231  TROPONINI 0.06* 1.03* 2.00* 2.82*   No results for input(s): TROPIPOC in the last 168 hours.  BNPNo results for input(s): BNP, PROBNP in the last 168 hours.  DDimer No results for input(s): DDIMER in the last 168 hours.  Radiology/Studies:  Dg Chest 2 View  Result Date: 05/23/2017 IMPRESSION: Diffuse interstitial infiltrates likely representing pulmonary edema, little changed. Electronically Signed   By: Lavonia Dana M.D.   On: 05/23/2017 08:05   Ct Head Wo Contrast  Result Date: 05/23/2017 IMPRESSION: No evidence  of injury.  Age normal head CT. Electronically Signed   By: Monte Fantasia M.D.   On: 05/23/2017 07:58   Mr Brain Wo Contrast  Result Date: 05/23/2017 IMPRESSION: 1. No acute finding. 2. Partial and severely motion degraded study due to altered mental status. Electronically Signed   By: Monte Fantasia M.D.   On: 05/23/2017 15:44    Assessment and Plan:   1. NSTEMI/CAD s/p CABG: -Likely in the setting of sepsis with bacteremia, anemia, and accelerated HTN with known coronary artery disease -Continue to trend until peaks -Heparin gtt x total of 48 hours -ASA, consider adding Plavix with loading dose -Check echo -Unlikely to be a candidate for invasive cardiac testing given her advanced age, comorbid conditions including dementia, as well as her acute illness  -Escalate evidence based therapy as indicated  2. Severe aortic stenosis s/p AVR: -Echo pending  3. Sepsis with GPC bacteremia: -Per IM  4. Hypokalemia: -Recommend repletion to goal > 4.0   5. Anemia: -Per IM  6. Dispo: -Recommend IM get palliative care involved for [place]   For questions or updates, please contact [place] Please consult [email or web address] for contact info under Cardiology/STEMI.   Signed, Christell Faith, PA-C [place] Pager: [phone] 05/24/2017, 9:21 AM

## 2017-05-24 NOTE — Progress Notes (Signed)
PT Cancellation Note  Patient Details Name: Ariana Miller MRN: [id number] DOB: June 09, 1923   Cancelled Treatment:    Reason Eval/Treat Not Completed: Patient's level of consciousness.  Contacted RN and pt is still lethargic and unable to respond to commands.  Will re-attempt later if time allows.   Roxanne Gates, PT, DPT 05/24/2017, 11:33 AM

## 2017-05-24 NOTE — Progress Notes (Signed)
ANTICOAGULATION CONSULT NOTE - Initial Consult  Pharmacy Consult for heparin gtt Indication: chest pain/ACS  No Known Allergies  Patient Measurements: Weight: 157 lb 13.6 oz (71.6 kg) Heparin Dosing Weight: 68.9kg  Vital Signs: Temp: 99.2 F (37.3 C) (05/22 0419) Temp Source: Oral (05/22 0419) BP: 127/55 (05/22 0419) Pulse Rate: 92 (05/22 0419)  Labs: Recent Labs    05/23/17 0713 05/23/17 1217 05/23/17 1616 05/23/17 1814 05/23/17 2231 05/24/17 0221 05/24/17 1011  HGB 11.8* 10.3*  --   --   --  9.5*  --   HCT 35.7 31.6*  --   --   --  29.0*  --   PLT 81* 62*  --   --   --  61*  --   APTT  --   --   --  35  --   --   --   LABPROT 15.1  --   --  16.1*  --   --   --   INR 1.20  --   --  1.30  --   --   --   HEPARINUNFRC  --   --   --   --   --  0.59 0.40  CREATININE 0.85 0.94  --   --   --  0.83  --   CKTOTAL 42  --   --   --   --   --   --   TROPONINI 0.06* 1.03* 2.00*  --  2.82*  --   --     Estimated Creatinine Clearance: 38.2 mL/min (by C-G formula based on SCr of 0.83 mg/dL).   Medical History: Past Medical History:  Diagnosis Date  . Anemia   . Arthritis   . Asthma   . Bladder cancer ([place])   . CAD (coronary artery disease)    a. 2-V CABG 03/2007 (LIMA-LAD, SVG-D1)  . Diabetes mellitus with complication ([place])   . Diastolic heart failure ([place])   . History of gastric ulcer   . HTN (hypertension)   . Pulmonary fibrosis ([place])   . Severe aortic stenosis    a. s/p Carpentier-Edwards AVR 03/2007; b. echo 2009 (pre-AVR): nl LVEF, mild LVH, severe AS, mild MS, trivial AR/MR/TR/PR, nl RVSF    Medications:  Medications Prior to Admission  Medication Sig Dispense Refill Last Dose  . aspirin EC 81 MG tablet Take 81 mg by mouth daily.   05/21/2017 at 2000  . ferrous sulfate 325 (65 FE) MG tablet Take 325 mg by mouth daily with breakfast.   05/21/2017 at 2000  . glimepiride (AMARYL) 1 MG tablet Take 0.5 mg by mouth daily as needed (high blood sugar).    05/23/2017 at  0630  . benzonatate (TESSALON PERLES) 100 MG capsule Take 1 capsule (100 mg total) by mouth 3 (three) times daily as needed for cough. (Patient not taking: Reported on 05/23/2017) 30 capsule 0 Completed Course at Unknown time  . guaiFENesin-dextromethorphan (ROBITUSSIN DM) 100-10 MG/5ML syrup Take 5 mLs by mouth every 4 (four) hours as needed for cough. (Patient not taking: Reported on 05/23/2017) 118 mL 0 Not Taking at Unknown time   Scheduled:  . aspirin  81 mg Oral Daily  . ferrous sulfate  325 mg Oral Q breakfast  . insulin aspart  0-9 Units Subcutaneous TID WC  . pneumococcal 23 valent vaccine  0.5 mL Intramuscular Tomorrow-1000   Infusions:  . 0.9 % NaCl with KCl 20 mEq / L 50 mL/hr at 05/24/17 0419  . ampicillin (  OMNIPEN) IV Stopped (05/24/17 0546)  . cefTRIAXone (ROCEPHIN)  IV Stopped (05/23/17 2350)  . heparin 850 Units/hr (05/24/17 0418)  . potassium chloride 10 mEq (05/24/17 1023)  . vancomycin Stopped (05/23/17 2341)   PRN: acetaminophen **OR** acetaminophen, ibuprofen, LORazepam, ondansetron **OR** ondansetron (ZOFRAN) IV, polyethylene glycol Anti-infectives (From admission, onward)   Start     Dose/Rate Route Frequency Ordered Stop   05/23/17 2200  cefTRIAXone (ROCEPHIN) 2 g in sodium chloride 0.9 % 100 mL IVPB     2 g 200 mL/hr over 30 Minutes Intravenous Every 12 hours 05/23/17 1131     05/23/17 2000  vancomycin (VANCOCIN) IVPB 1000 mg/200 mL premix     1,000 mg 200 mL/hr over 60 Minutes Intravenous Every 24 hours 05/23/17 1128     05/23/17 1800  ampicillin (OMNIPEN) 2 g in sodium chloride 0.9 % 100 mL IVPB     2 g 300 mL/hr over 20 Minutes Intravenous Every 6 hours 05/23/17 1140     05/23/17 1145  ampicillin (OMNIPEN) 2 g in sodium chloride 0.9 % 100 mL IVPB     2 g 300 mL/hr over 20 Minutes Intravenous  Once 05/23/17 1140 05/23/17 1836   05/23/17 1130  cefTRIAXone (ROCEPHIN) 2 g in sodium chloride 0.9 % 100 mL IVPB  Status:  Discontinued     2 g 200 mL/hr over 30  Minutes Intravenous Every 24 hours 05/23/17 1126 05/23/17 1131   05/23/17 1015  cefTRIAXone (ROCEPHIN) 2 g in sodium chloride 0.9 % 100 mL IVPB     2 g 200 mL/hr over 30 Minutes Intravenous  Once 05/23/17 1008 05/23/17 1050   05/23/17 1015  vancomycin (VANCOCIN) IVPB 1000 mg/200 mL premix     1,000 mg 200 mL/hr over 60 Minutes Intravenous  Once 05/23/17 1009 05/23/17 1215   05/23/17 1000  cefTRIAXone (ROCEPHIN) 1 g in sodium chloride 0.9 % 100 mL IVPB  Status:  Discontinued     1 g 200 mL/hr over 30 Minutes Intravenous  Once 05/23/17 0955 05/23/17 1008      Assessment: 84 year old female with ACS requiring heparin gtt per pharmacy.   Goal of Therapy:  Heparin level 0.3-0.7 units/ml Monitor platelets by anticoagulation protocol: Yes   Plan:  05/22 @ 221 HL 0.59 therapeutic. Will continue current rate and will recheck anti-Xa @ 1000.  05/24/17 10:11 HL therapeutic x 2. Continue current rate. Will recheck HL and CBC daily per protocol  Ovid Curd A. Jordan Hawks, PharmD, BCPS Clinical Pharmacist 05/24/2017

## 2017-05-24 NOTE — Progress Notes (Signed)
ANTICOAGULATION CONSULT NOTE - Initial Consult  Pharmacy Consult for heparin gtt Indication: chest pain/ACS  No Known Allergies  Patient Measurements: Weight: 151 lb 14.4 oz (68.9 kg) Heparin Dosing Weight: 68.9kg  Vital Signs: Temp: 97.8 F (36.6 C) (05/21 2024) Temp Source: Oral (05/21 2024) BP: 134/48 (05/21 2024) Pulse Rate: 75 (05/21 2024)  Labs: Recent Labs    05/23/17 0713 05/23/17 1217 05/23/17 1616 05/23/17 1814 05/23/17 2231 05/24/17 0221  HGB 11.8* 10.3*  --   --   --   --   HCT 35.7 31.6*  --   --   --   --   PLT 81* 62*  --   --   --   --   APTT  --   --   --  35  --   --   LABPROT 15.1  --   --  16.1*  --   --   INR 1.20  --   --  1.30  --   --   HEPARINUNFRC  --   --   --   --   --  0.59  CREATININE 0.85 0.94  --   --   --  0.83  CKTOTAL 42  --   --   --   --   --   TROPONINI 0.06* 1.03* 2.00*  --  2.82*  --     Estimated Creatinine Clearance: 37.5 mL/min (by C-G formula based on SCr of 0.83 mg/dL).   Medical History: Past Medical History:  Diagnosis Date  . Anemia   . Arthritis   . Asthma   . Bladder cancer ([place])   . CAD (coronary artery disease)    a. 2-V CABG 03/2007 (LIMA-LAD, SVG-D1)  . Diabetes mellitus with complication ([place])   . Diastolic heart failure ([place])   . History of gastric ulcer   . HTN (hypertension)   . Pulmonary fibrosis ([place])   . Severe aortic stenosis    a. s/p Carpentier-Edwards AVR 03/2007; b. echo 2009 (pre-AVR): nl LVEF, mild LVH, severe AS, mild MS, trivial AR/MR/TR/PR, nl RVSF    Medications:  Medications Prior to Admission  Medication Sig Dispense Refill Last Dose  . aspirin EC 81 MG tablet Take 81 mg by mouth daily.   05/21/2017 at 2000  . ferrous sulfate 325 (65 FE) MG tablet Take 325 mg by mouth daily with breakfast.   05/21/2017 at 2000  . glimepiride (AMARYL) 1 MG tablet Take 0.5 mg by mouth daily as needed (high blood sugar).    05/23/2017 at 0630  . benzonatate (TESSALON PERLES) 100 MG capsule Take 1  capsule (100 mg total) by mouth 3 (three) times daily as needed for cough. (Patient not taking: Reported on 05/23/2017) 30 capsule 0 Completed Course at Unknown time  . guaiFENesin-dextromethorphan (ROBITUSSIN DM) 100-10 MG/5ML syrup Take 5 mLs by mouth every 4 (four) hours as needed for cough. (Patient not taking: Reported on 05/23/2017) 118 mL 0 Not Taking at Unknown time   Scheduled:  . aspirin  81 mg Oral Daily  . ferrous sulfate  325 mg Oral Q breakfast  . insulin aspart  0-9 Units Subcutaneous TID WC  . pneumococcal 23 valent vaccine  0.5 mL Intramuscular Tomorrow-1000   Infusions:  . 0.9 % NaCl with KCl 20 mEq / L Stopped (05/23/17 1941)  . ampicillin (OMNIPEN) IV Stopped (05/24/17 0025)  . cefTRIAXone (ROCEPHIN)  IV Stopped (05/23/17 2350)  . heparin 850 Units/hr (05/23/17 1907)  . potassium chloride 10  mEq (05/24/17 0251)  . vancomycin Stopped (05/23/17 2341)   PRN: acetaminophen **OR** acetaminophen, ibuprofen, LORazepam, ondansetron **OR** ondansetron (ZOFRAN) IV, polyethylene glycol Anti-infectives (From admission, onward)   Start     Dose/Rate Route Frequency Ordered Stop   05/23/17 2200  cefTRIAXone (ROCEPHIN) 2 g in sodium chloride 0.9 % 100 mL IVPB     2 g 200 mL/hr over 30 Minutes Intravenous Every 12 hours 05/23/17 1131     05/23/17 2000  vancomycin (VANCOCIN) IVPB 1000 mg/200 mL premix     1,000 mg 200 mL/hr over 60 Minutes Intravenous Every 24 hours 05/23/17 1128     05/23/17 1800  ampicillin (OMNIPEN) 2 g in sodium chloride 0.9 % 100 mL IVPB     2 g 300 mL/hr over 20 Minutes Intravenous Every 6 hours 05/23/17 1140     05/23/17 1145  ampicillin (OMNIPEN) 2 g in sodium chloride 0.9 % 100 mL IVPB     2 g 300 mL/hr over 20 Minutes Intravenous  Once 05/23/17 1140 05/23/17 1836   05/23/17 1130  cefTRIAXone (ROCEPHIN) 2 g in sodium chloride 0.9 % 100 mL IVPB  Status:  Discontinued     2 g 200 mL/hr over 30 Minutes Intravenous Every 24 hours 05/23/17 1126 05/23/17 1131    05/23/17 1015  cefTRIAXone (ROCEPHIN) 2 g in sodium chloride 0.9 % 100 mL IVPB     2 g 200 mL/hr over 30 Minutes Intravenous  Once 05/23/17 1008 05/23/17 1050   05/23/17 1015  vancomycin (VANCOCIN) IVPB 1000 mg/200 mL premix     1,000 mg 200 mL/hr over 60 Minutes Intravenous  Once 05/23/17 1009 05/23/17 1215   05/23/17 1000  cefTRIAXone (ROCEPHIN) 1 g in sodium chloride 0.9 % 100 mL IVPB  Status:  Discontinued     1 g 200 mL/hr over 30 Minutes Intravenous  Once 05/23/17 0955 05/23/17 1008      Assessment: 84 year old female with ACS requiring heparin gtt per pharmacy.   Goal of Therapy:  Heparin level 0.3-0.7 units/ml Monitor platelets by anticoagulation protocol: Yes   Plan:  05/22 @ 221 HL 0.59 therapeutic. Will continue current rate and will recheck anti-Xa @ 1000.  Tobie Lords, PharmD, BCPS Clinical Pharmacist 05/24/2017

## 2017-05-24 NOTE — Progress Notes (Signed)
Pharmacy Electrolyte Monitoring Consult:  Pharmacy consulted to assist in monitoring and replacing electrolytes in this 84 y.o. female admitted on 05/23/2017 with witnessed seizure and being treated for sepsis/possible meningitis.   Patient currently ordered NS at 43mL/hr.   Labs:  Sodium (mmol/L)  Date Value  05/24/2017 134 (L)  01/05/2012 135 (L)   Potassium (mmol/L)  Date Value  05/24/2017 3.3 (L)  01/05/2012 3.8   Magnesium (mg/dL)  Date Value  05/24/2017 2.5 (H)   Phosphorus (mg/dL)  Date Value  05/24/2017 3.4   Calcium (mg/dL)  Date Value  05/24/2017 7.4 (L)   Calcium, Total (mg/dL)  Date Value  01/05/2012 7.8 (L)   Albumin (g/dL)  Date Value  05/24/2017 2.7 (L)  01/05/2012 3.3 (L)    Assessment/Plan: Potassium 70mEq IV Q1hr x 3 doses. Will change MIVF to NS/61mEq KCL at 81mL/hr. Magnesium and phosphorus levels ordered. Goal potassium ~ 4 and goal magnesium ~ 2 in setting seizure.   5/21 @ 1217:  Mag= 1.3,  Phos= 1.6. Will order Magnesium sulfate 4 gram IV x 1. Will order Potassium Phosphate 15 mmol IV x 1. Will recheck Phos and K at 2300 and  Mag with am labs  Addendum:  Pt went to MRI and electrolytes not hung. Rescheduled Mag and KPhos IVs to 1600 and rescheduled labs.  Pharmacy will continue to monitor and adjust per consult.   05/24/17 05:02 K 3.3, phos 3.4. Patient received IV supplements yesterday but today has diet ordered. Will order potassium chloride 10% oral solution 20 mEq po TID with meals x 3 doses and phos-nak packets 1 packet po BID with meals x 2 doses. Recheck all electrolytes tomorrow with AM labs.  Laural Benes, Pharm.D., BCPS Clinical Pharmacist 05/24/2017 7:18 AM

## 2017-05-24 NOTE — Progress Notes (Signed)
Chaplain followed up with Pt and daughter. Pt is getting better per daughter. Chaplaij prayed with Pt and daughter for continued improvement.    05/24/17 1300  Clinical Encounter Type  Visited With Patient and family together  Visit Type Follow-up;Spiritual support  Referral From [place]

## 2017-05-25 ENCOUNTER — Inpatient Hospital Stay: Payer: Medicare Other

## 2017-05-25 DIAGNOSIS — R54 Age-related physical debility: Secondary | ICD-10-CM

## 2017-05-25 DIAGNOSIS — R4182 Altered mental status, unspecified: Secondary | ICD-10-CM

## 2017-05-25 LAB — CBC
HCT: 24.3 % — ABNORMAL LOW (ref 35.0–47.0)
Hemoglobin: 8.2 g/dL — ABNORMAL LOW (ref 12.0–16.0)
MCH: 31 pg (ref 26.0–34.0)
MCHC: 33.5 g/dL (ref 32.0–36.0)
MCV: 92.5 fL (ref 80.0–100.0)
PLATELETS: 56 10*3/uL — AB (ref 150–440)
RBC: 2.63 MIL/uL — ABNORMAL LOW (ref 3.80–5.20)
RDW: 18.3 % — AB (ref 11.5–14.5)
WBC: 15.4 10*3/uL — ABNORMAL HIGH (ref 3.6–11.0)

## 2017-05-25 LAB — HERPES SIMPLEX VIRUS(HSV) DNA BY PCR
HSV 1 DNA: NEGATIVE
HSV 2 DNA: NEGATIVE

## 2017-05-25 LAB — MAGNESIUM: MAGNESIUM: 1.9 mg/dL (ref 1.7–2.4)

## 2017-05-25 LAB — BASIC METABOLIC PANEL
Anion gap: 7 (ref 5–15)
BUN: 17 mg/dL (ref 6–20)
CHLORIDE: 102 mmol/L (ref 101–111)
CO2: 23 mmol/L (ref 22–32)
Calcium: 6.9 mg/dL — ABNORMAL LOW (ref 8.9–10.3)
Creatinine, Ser: 0.84 mg/dL (ref 0.44–1.00)
GFR, EST NON AFRICAN AMERICAN: 59 mL/min — AB (ref >60)
Glucose, Bld: 101 mg/dL — ABNORMAL HIGH (ref 65–99)
Potassium: 3.4 mmol/L — ABNORMAL LOW (ref 3.5–5.1)
SODIUM: 132 mmol/L — AB (ref 135–145)

## 2017-05-25 LAB — GLUCOSE, CAPILLARY
Glucose-Capillary: 101 mg/dL — ABNORMAL HIGH (ref 65–99)
Glucose-Capillary: 152 mg/dL — ABNORMAL HIGH (ref 65–99)
Glucose-Capillary: 156 mg/dL — ABNORMAL HIGH (ref 65–99)
Glucose-Capillary: 171 mg/dL — ABNORMAL HIGH (ref 65–99)

## 2017-05-25 LAB — HEPARIN LEVEL (UNFRACTIONATED): Heparin Unfractionated: 0.36 IU/mL (ref 0.30–0.70)

## 2017-05-25 LAB — PROCALCITONIN: Procalcitonin: 17.03 ng/mL

## 2017-05-25 LAB — PHOSPHORUS: Phosphorus: 2.3 mg/dL — ABNORMAL LOW (ref 2.5–4.6)

## 2017-05-25 MED ORDER — POTASSIUM CHLORIDE 10 MEQ/100ML IV SOLN
10.0000 meq | INTRAVENOUS | Status: AC
  Administered 2017-05-25 (×2): 10 meq via INTRAVENOUS
  Filled 2017-05-25 (×2): qty 100

## 2017-05-25 MED ORDER — POTASSIUM PHOSPHATES 15 MMOLE/5ML IV SOLN
10.0000 mmol | Freq: Once | INTRAVENOUS | Status: AC
  Administered 2017-05-25: 10 mmol via INTRAVENOUS
  Filled 2017-05-25: qty 3.33

## 2017-05-25 MED ORDER — MAGNESIUM SULFATE IN D5W 1-5 GM/100ML-% IV SOLN
1.0000 g | Freq: Once | INTRAVENOUS | Status: AC
  Administered 2017-05-25: 1 g via INTRAVENOUS
  Filled 2017-05-25: qty 100

## 2017-05-25 NOTE — Progress Notes (Signed)
ANTICOAGULATION CONSULT NOTE - Initial Consult  Pharmacy Consult for heparin gtt Indication: chest pain/ACS  No Known Allergies  Patient Measurements: Weight: 158 lb 1.1 oz (71.7 kg) Heparin Dosing Weight: 68.9kg  Vital Signs: Temp: 98.2 F (36.8 C) (05/23 0433) Temp Source: Oral (05/23 0433) BP: 127/40 (05/23 0433) Pulse Rate: 84 (05/23 0433)  Labs: Recent Labs    05/23/17 0713 05/23/17 1217 05/23/17 1616 05/23/17 1814 05/23/17 2231 05/24/17 0221 05/24/17 1011 05/25/17 0417  HGB 11.8* 10.3*  --   --   --  9.5*  --  8.2*  HCT 35.7 31.6*  --   --   --  29.0*  --  24.3*  PLT 81* 62*  --   --   --  61*  --  56*  APTT  --   --   --  35  --   --   --   --   LABPROT 15.1  --   --  16.1*  --   --   --   --   INR 1.20  --   --  1.30  --   --   --   --   HEPARINUNFRC  --   --   --   --   --  0.59 0.40 0.36  CREATININE 0.85 0.94  --   --   --  0.83  --  0.84  CKTOTAL 42  --   --   --   --   --   --   --   TROPONINI 0.06* 1.03* 2.00*  --  2.82*  --  2.93*  --     Estimated Creatinine Clearance: 37.8 mL/min (by C-G formula based on SCr of 0.84 mg/dL).   Medical History: Past Medical History:  Diagnosis Date  . Anemia   . Arthritis   . Asthma   . Bladder cancer ([place])   . CAD (coronary artery disease)    a. 2-V CABG 03/2007 (LIMA-LAD, SVG-D1)  . Diabetes mellitus with complication ([place])   . Diastolic heart failure ([place])   . History of gastric ulcer   . HTN (hypertension)   . Pulmonary fibrosis ([place])   . Severe aortic stenosis    a. s/p Carpentier-Edwards AVR 03/2007; b. echo 2009 (pre-AVR): nl LVEF, mild LVH, severe AS, mild MS, trivial AR/MR/TR/PR, nl RVSF    Medications:  Medications Prior to Admission  Medication Sig Dispense Refill Last Dose  . aspirin EC 81 MG tablet Take 81 mg by mouth daily.   05/21/2017 at 2000  . ferrous sulfate 325 (65 FE) MG tablet Take 325 mg by mouth daily with breakfast.   05/21/2017 at 2000  . glimepiride (AMARYL) 1 MG tablet Take 0.5  mg by mouth daily as needed (high blood sugar).    05/23/2017 at 0630  . benzonatate (TESSALON PERLES) 100 MG capsule Take 1 capsule (100 mg total) by mouth 3 (three) times daily as needed for cough. (Patient not taking: Reported on 05/23/2017) 30 capsule 0 Completed Course at Unknown time  . guaiFENesin-dextromethorphan (ROBITUSSIN DM) 100-10 MG/5ML syrup Take 5 mLs by mouth every 4 (four) hours as needed for cough. (Patient not taking: Reported on 05/23/2017) 118 mL 0 Not Taking at Unknown time   Scheduled:  . aspirin  81 mg Oral Daily  . ferrous sulfate  325 mg Oral Q breakfast  . insulin aspart  0-9 Units Subcutaneous TID WC  . mouth rinse  15 mL Mouth Rinse BID  . pneumococcal  23 valent vaccine  0.5 mL Intramuscular Tomorrow-1000   Infusions:  . 0.9 % NaCl with KCl 20 mEq / L 50 mL/hr at 05/25/17 0105  . ampicillin (OMNIPEN) IV Stopped (05/25/17 0120)  . heparin 850 Units/hr (05/25/17 0105)  . vancomycin Stopped (05/24/17 2101)   PRN: acetaminophen **OR** acetaminophen, ibuprofen, LORazepam, ondansetron **OR** ondansetron (ZOFRAN) IV, polyethylene glycol Anti-infectives (From admission, onward)   Start     Dose/Rate Route Frequency Ordered Stop   05/24/17 1230  ampicillin (OMNIPEN) 2 g in sodium chloride 0.9 % 100 mL IVPB     2 g 300 mL/hr over 20 Minutes Intravenous Every 6 hours 05/24/17 1227     05/23/17 2200  cefTRIAXone (ROCEPHIN) 2 g in sodium chloride 0.9 % 100 mL IVPB  Status:  Discontinued     2 g 200 mL/hr over 30 Minutes Intravenous Every 12 hours 05/23/17 1131 05/24/17 1206   05/23/17 2000  vancomycin (VANCOCIN) IVPB 1000 mg/200 mL premix     1,000 mg 200 mL/hr over 60 Minutes Intravenous Every 24 hours 05/23/17 1128     05/23/17 1800  ampicillin (OMNIPEN) 2 g in sodium chloride 0.9 % 100 mL IVPB  Status:  Discontinued     2 g 300 mL/hr over 20 Minutes Intravenous Every 6 hours 05/23/17 1140 05/24/17 1206   05/23/17 1145  ampicillin (OMNIPEN) 2 g in sodium chloride 0.9 %  100 mL IVPB     2 g 300 mL/hr over 20 Minutes Intravenous  Once 05/23/17 1140 05/23/17 1836   05/23/17 1130  cefTRIAXone (ROCEPHIN) 2 g in sodium chloride 0.9 % 100 mL IVPB  Status:  Discontinued     2 g 200 mL/hr over 30 Minutes Intravenous Every 24 hours 05/23/17 1126 05/23/17 1131   05/23/17 1015  cefTRIAXone (ROCEPHIN) 2 g in sodium chloride 0.9 % 100 mL IVPB     2 g 200 mL/hr over 30 Minutes Intravenous  Once 05/23/17 1008 05/23/17 1050   05/23/17 1015  vancomycin (VANCOCIN) IVPB 1000 mg/200 mL premix     1,000 mg 200 mL/hr over 60 Minutes Intravenous  Once 05/23/17 1009 05/23/17 1215   05/23/17 1000  cefTRIAXone (ROCEPHIN) 1 g in sodium chloride 0.9 % 100 mL IVPB  Status:  Discontinued     1 g 200 mL/hr over 30 Minutes Intravenous  Once 05/23/17 0955 05/23/17 1008      Assessment: 84 year old female with ACS requiring heparin gtt per pharmacy.   Goal of Therapy:  Heparin level 0.3-0.7 units/ml Monitor platelets by anticoagulation protocol: Yes   Plan:  05/23 @ 0417 HL 0.36 therapeutic. Will continue current rate and will recheck HL w/ am labs. hgb and platelets steadily trending down. Will continue to monitor.  Tobie Lords, PharmD, BCPS Clinical Pharmacist 05/25/2017

## 2017-05-25 NOTE — Progress Notes (Addendum)
[place] [facility] [place] NAME: Ariana Miller    MR#:  [id number]  DATE OF BIRTH:  [birth year]  SUBJECTIVE:  Patient seen and evaluated today Is awake and responds to verbal commands Moves all extremities No new fevers No chest pain No shortness of breath   REVIEW OF SYSTEMS:    Review of Systems  Constitutional: Positive for malaise/fatigue.  HENT: Negative.   Eyes: Negative.   Respiratory: Negative.   Cardiovascular: Negative.   Gastrointestinal: Negative.   Neurological: Positive for seizures.  Endo/Heme/Allergies: Negative.   Psychiatric/Behavioral: Negative.      DRUG ALLERGIES:  No Known Allergies  VITALS:  Blood pressure (!) 127/40, pulse 84, temperature 98.2 F (36.8 C), temperature source Oral, resp. rate 19, weight 71.7 kg (158 lb 1.1 oz), SpO2 98 %.  PHYSICAL EXAMINATION:   Physical Exam  GENERAL:  84 y.o.-year-old elderly patient lying in the bed with no acute distress.  EYES: Pupils equal, round, reactive to light and accommodation. No scleral icterus. Extraocular muscles intact.  HEENT: Head atraumatic, normocephalic. Oropharynx and nasopharynx clear.  NECK:  Supple, no jugular venous distention. No thyroid enlargement, no tenderness.  LUNGS: Normal breath sounds bilaterally, no wheezing, rales, rhonchi. No use of accessory muscles of respiration.  CARDIOVASCULAR: S1, S2 normal. No murmurs, rubs, or gallops.  ABDOMEN: Soft, nontender, nondistended. Bowel sounds present. No organomegaly or mass.  EXTREMITIES: No cyanosis, clubbing or edema b/l.    NEUROLOGIC: Awake, arousable to loud verbal commands Moves all extremities PSYCHIATRIC: mood pleasant SKIN: No obvious rash, lesion, or ulcer.   LABORATORY PANEL:   CBC Recent Labs  Lab 05/25/17 0417  WBC 15.4*  HGB 8.2*  HCT 24.3*  PLT 56*    ------------------------------------------------------------------------------------------------------------------ Chemistries  Recent Labs  Lab 05/24/17 0221  05/25/17 0417  NA 134*  --  132*  K 3.1*   < > 3.4*  CL 102  --  102  CO2 23  --  23  GLUCOSE 84  --  101*  BUN 17  --  17  CREATININE 0.83  --  0.84  CALCIUM 7.4*  --  6.9*  MG 2.5*  --  1.9  AST 51*  --   --   ALT 21  --   --   ALKPHOS 77  --   --   BILITOT 1.0  --   --    < > = values in this interval not displayed.   ------------------------------------------------------------------------------------------------------------------  Cardiac Enzymes Recent Labs  Lab 05/24/17 1011  TROPONINI 2.93*   ------------------------------------------------------------------------------------------------------------------  RADIOLOGY:  Mr Brain Wo Contrast  Result Date: 05/23/2017 CLINICAL DATA:  Altered level of consciousness, unexplained EXAM: MRI HEAD WITHOUT CONTRAST TECHNIQUE: Multiplanar, multiecho pulse sequences of the brain and surrounding structures were obtained without intravenous contrast. COMPARISON:  Head CT from earlier today FINDINGS: Severely motion degraded sagittal T1, diffusion, axial T2, axial FLAIR was obtained. No restricted diffusion seen on both axial and coronal scans. Major flow voids are preserved. There is no hydrocephalus or midline shift. The study was terminated early due to concerns for patient's safety. IMPRESSION: 1. No acute finding. 2. Partial and severely motion degraded study due to altered mental status. Electronically Signed   By: Monte Fantasia M.D.   On: 05/23/2017 15:44     ASSESSMENT AND PLAN:  Ariana Miller  is a 84 y.o. female with a known history of mild dementia chronic anemia, bladder cancer, CAD/CABG, diabetes and chronic diastolic heart failure  with preserved ejection fraction who presents to the emergency room due to a fall and generalized weakness.  -Sepsis secondary to  bacteremia Continue IV vancomycin antibiotic Continue IV ampicillin ABX F/u procalcitonin level  -Staph aureus and streptococcal B Agalactiae bacteremia Continue IV vancomycin and start IV ampicillin antibiotic F/U sentivities  -Meningitis ruled out CSF panel negative so far Follow-up CSF culture  -Non-STEMI H/O CAD/CABG Elevated troponin secondary to bacteremia and sepsis Heparin drip to be discontinued in am as per cardiology Aspirin and Plavix Follow-up echocardiogram Appreciate cardiology evaluation Not a candidate for invasive cardiac testing considering advanced age and comorbidities  -Hypokalemia Replace potassium  Anemia of chronic disease- Monitor hemoglobin hematocrit  -Chronic diastolic heart failure Watch for fluid overload  -Seizure Follow-up EEG report MRI brain no abnormality Probably secondary to fever  -Metabolic encephalopathy secondary to sepsis Treat underlying infection  - Thrombocytopenia Chronic F/U platelet count    All the records are reviewed and case discussed with Care Management/Social Worker. Management plans discussed with the patient, family and they are in agreement.  CODE STATUS: DNR  DVT Prophylaxis: SCDs  TOTAL TIME TAKING CARE OF THIS PATIENT: 35 minutes.   POSSIBLE D/C IN 3 to 4 DAYS, DEPENDING ON CLINICAL CONDITION.  Saundra Shelling M.D on 05/25/2017 at 3:04 PM  Between 7am to 6pm - Pager - [phone]  After 6pm go to [email or web address] - password [email or web address] [facility]  Office  [phone]  CC: Primary care physician; Rusty Aus, MD  Note: This dictation was prepared with Dragon dictation along with smaller phrase technology. Any transcriptional errors that result from this process are unintentional.

## 2017-05-25 NOTE — Progress Notes (Addendum)
Subjective: Patient somewhat more alert today.  More talkative.    Objective: Current vital signs: BP (!) 127/40 (BP Location: Right Arm)   Pulse 84   Temp 98.2 F (36.8 C) (Oral)   Resp 19   Wt 71.7 kg (158 lb 1.1 oz)   SpO2 98%   BMI 30.87 kg/m  Vital signs in last 24 hours: Temp:  [98.2 F (36.8 C)-100.8 F (38.2 C)] 98.2 F (36.8 C) (05/23 0433) Pulse Rate:  [80-88] 84 (05/23 0433) Resp:  [16-20] 19 (05/23 0433) BP: (127-141)/(40-60) 127/40 (05/23 0433) SpO2:  [98 %-100 %] 98 % (05/23 0433) Weight:  [71.7 kg (158 lb 1.1 oz)] 71.7 kg (158 lb 1.1 oz) (05/23 0433)  Intake/Output from previous day: 05/22 0701 - 05/23 0700 In: 2173 [P.O.:420; I.V.:1053; IV Piggyback:700] Out: -  Intake/Output this shift: Total I/O In: 340 [P.O.:240; IV Piggyback:100] Out: -  Nutritional status:  Diet Order           DIET - DYS 1 Room service appropriate? Yes with Assist; Fluid consistency: Thin  Diet effective now          Neurologic Exam: Mental Status: Resting but easily awakened.  Follows some simple commands.  Speech improved.   Cranial Nerves: II: Blinks to bilateral confrontation III,IV, VI: ptosis not present, extra-ocular motions intact bilaterally V,VII: smile symmetric, facial light touch sensation normal bilaterally VIII: hearing normal bilaterally IX,X: unable to test XI: unable to test XII: unable to test Motor: Moves all extremities weakly Sensory: Responds to noxious stimuli throughout  Lab Results: Basic Metabolic Panel: Recent Labs  Lab 05/23/17 0713 05/23/17 1217 05/24/17 0221 05/24/17 0502 05/25/17 0417  NA 131*  --  134*  --  132*  K 3.0*  --  3.1* 3.3* 3.4*  CL 98*  --  102  --  102  CO2 21*  --  23  --  23  GLUCOSE 201*  --  84  --  101*  BUN 16  --  17  --  17  CREATININE 0.85 0.94 0.83  --  0.84  CALCIUM 8.3*  --  7.4*  --  6.9*  MG  --  1.3* 2.5*  --  1.9  PHOS  --  1.6*  --  3.4 2.3*    Liver Function Tests: Recent Labs  Lab  05/23/17 0713 05/24/17 0221  AST 46* 51*  ALT 20 21  ALKPHOS 127* 77  BILITOT 2.1* 1.0  PROT 8.4* 6.8  ALBUMIN 3.5 2.7*   No results for input(s): LIPASE, AMYLASE in the last 168 hours. No results for input(s): AMMONIA in the last 168 hours.  CBC: Recent Labs  Lab 05/23/17 0713 05/23/17 1217 05/24/17 0221 05/25/17 0417  WBC 15.5* 23.1* 25.3* 15.4*  NEUTROABS 13.9*  --   --   --   HGB 11.8* 10.3* 9.5* 8.2*  HCT 35.7 31.6* 29.0* 24.3*  MCV 93.6 93.9 94.0 92.5  PLT 81* 62* 61* 56*    Cardiac Enzymes: Recent Labs  Lab 05/23/17 0713 05/23/17 1217 05/23/17 1616 05/23/17 2231 05/24/17 1011  CKTOTAL 42  --   --   --   --   TROPONINI 0.06* 1.03* 2.00* 2.82* 2.93*    Lipid Panel: No results for input(s): CHOL, TRIG, HDL, CHOLHDL, VLDL, LDLCALC in the last 168 hours.  CBG: Recent Labs  Lab 05/24/17 1210 05/24/17 1243 05/24/17 2213 05/25/17 0806 05/25/17 1151  GLUCAP 67 106* 102* 101* 152*    Microbiology: Results for orders  placed or performed during the hospital encounter of 05/23/17  Culture, blood (Routine x 2)     Status: Abnormal (Preliminary result)   Collection Time: 05/23/17  7:13 AM  Result Value Ref Range Status   Specimen Description   Final    BLOOD LEFT [facility] Performed at [facility], [address], [place], [place] [address]    Special Requests   Final    BOTTLES DRAWN AEROBIC AND ANAEROBIC Blood Culture adequate volume Performed at [facility], [address] [place], [place] [address]    Culture  Setup Time   Final    GRAM POSITIVE COCCI IN BOTH AEROBIC AND ANAEROBIC BOTTLES CRITICAL RESULT CALLED TO, READ BACK BY AND VERIFIED WITH: JASON ROBBINS @1546  05/23/17 AKT Performed at [facility], [place] [address] [place], [place] [address]    Culture GROUP B STREP(S.AGALACTIAE)ISOLATED (A)  Final   Report Status PENDING  Incomplete  Blood Culture ID Panel (Reflexed)     Status: Abnormal   Collection Time: 05/23/17   7:13 AM  Result Value Ref Range Status   Enterococcus species NOT DETECTED NOT DETECTED Final   Listeria monocytogenes NOT DETECTED NOT DETECTED Final   Staphylococcus species NOT DETECTED NOT DETECTED Final   Staphylococcus aureus NOT DETECTED NOT DETECTED Final   Streptococcus species DETECTED (A) NOT DETECTED Final    Comment: CRITICAL RESULT CALLED TO, READ BACK BY AND VERIFIED WITH: JASON ROBBINS @1546  05/23/17 AKT    Streptococcus agalactiae DETECTED (A) NOT DETECTED Final    Comment: CRITICAL RESULT CALLED TO, READ BACK BY AND VERIFIED WITH: JASON ROBBINS @1546  05/23/17 AKT    Streptococcus pneumoniae NOT DETECTED NOT DETECTED Final   Streptococcus pyogenes NOT DETECTED NOT DETECTED Final   Acinetobacter baumannii NOT DETECTED NOT DETECTED Final   Enterobacteriaceae species NOT DETECTED NOT DETECTED Final   Enterobacter cloacae complex NOT DETECTED NOT DETECTED Final   Escherichia coli NOT DETECTED NOT DETECTED Final   Klebsiella oxytoca NOT DETECTED NOT DETECTED Final   Klebsiella pneumoniae NOT DETECTED NOT DETECTED Final   Proteus species NOT DETECTED NOT DETECTED Final   Serratia marcescens NOT DETECTED NOT DETECTED Final   Haemophilus influenzae NOT DETECTED NOT DETECTED Final   Neisseria meningitidis NOT DETECTED NOT DETECTED Final   Pseudomonas aeruginosa NOT DETECTED NOT DETECTED Final   Candida albicans NOT DETECTED NOT DETECTED Final   Candida glabrata NOT DETECTED NOT DETECTED Final   Candida krusei NOT DETECTED NOT DETECTED Final   Candida parapsilosis NOT DETECTED NOT DETECTED Final   Candida tropicalis NOT DETECTED NOT DETECTED Final    Comment: Performed at [facility], [address] [place], [place] [address]  Culture, blood (Routine x 2)     Status: Abnormal (Preliminary result)   Collection Time: 05/23/17  7:45 AM  Result Value Ref Range Status   Specimen Description   Final    BLOOD RIGHT HAND Performed at [facility], [address] [place], [place] [address]    Special Requests   Final    BOTTLES DRAWN AEROBIC AND ANAEROBIC Blood Culture results may not be optimal due to an excessive volume of blood received in culture bottles Performed at [facility], [address] [place], [place] [address]    Culture  Setup Time   Final    GRAM POSITIVE COCCI IN BOTH AEROBIC AND ANAEROBIC BOTTLES CRITICAL RESULT CALLED TO, READ BACK BY AND VERIFIED WITH: JASON ROBBINS @1546  05/23/17 AKT  Culture (A)  Final    STREPTOCOCCUS AGALACTIAE SUSCEPTIBILITIES TO FOLLOW Performed at [facility], [place] [address] [place], [place] [address]    Report Status PENDING  Incomplete  CSF culture     Status: None (Preliminary result)   Collection Time: 05/23/17  9:57 AM  Result Value Ref Range Status   Specimen Description   Final    CSF Performed at [facility], [address] [place], [place] [address]    Special Requests   Final    CSF Performed at [facility], [place], [place] [address]    Gram Stain   Final    NO ORGANISMS SEEN RARE WBC RARE RBC Performed at [facility], [address], [place], [place] [address]    Culture   Final    NO GROWTH 2 DAYS Performed at [facility], [place] [address], [place],  [address]    Report Status PENDING  Incomplete    Coagulation Studies: Recent Labs    05/23/17 0713 05/23/17 1814  LABPROT 15.1 16.1*  INR 1.20 1.30    Imaging: Mr Brain Wo Contrast  Result Date: 05/23/2017 CLINICAL DATA:  Altered level of consciousness, unexplained EXAM: MRI HEAD WITHOUT CONTRAST TECHNIQUE: Multiplanar, multiecho pulse sequences of the brain and surrounding structures were obtained without intravenous contrast. COMPARISON:  Head CT from earlier today FINDINGS: Severely motion degraded sagittal T1, diffusion, axial T2, axial FLAIR was obtained. No restricted diffusion seen on both axial and coronal scans. Major flow  voids are preserved. There is no hydrocephalus or midline shift. The study was terminated early due to concerns for patient's safety. IMPRESSION: 1. No acute finding. 2. Partial and severely motion degraded study due to altered mental status. Electronically Signed   By: Monte Fantasia M.D.   On: 05/23/2017 15:44    Medications:  I have reviewed the patient's current medications. Scheduled: . aspirin  81 mg Oral Daily  . ferrous sulfate  325 mg Oral Q breakfast  . insulin aspart  0-9 Units Subcutaneous TID WC  . mouth rinse  15 mL Mouth Rinse BID  . pneumococcal 23 valent vaccine  0.5 mL Intramuscular Tomorrow-1000    Assessment/Plan: Patient slowly improving.  Remains on antibiotics.  Herpes titer remains pending.  No further seizure activity.  May have been related to fever.    Agree with current management.     LOS: 2 days   Alexis Goodell, MD Neurology [phone] 05/25/2017  12:45 PM

## 2017-05-25 NOTE — Evaluation (Signed)
Physical Therapy Evaluation Patient Details Name: Ariana Miller MRN: [id number] DOB: 09-30-1923 Today's Date: 05/25/2017   History of Present Illness  84 y.o.femalewith a known history of mild dementia chronic anemia, bladder cancer, CAD/CABG, diabetes and chronic diastolic heart failure with preserved ejection fraction who presents to the emergency room due to a fall and generalized weakness.  Clinical Impression  Pt still somewhat lethargic and limited but was able to follow instructions for light exercises and attempt at getting to the EOB.  Spoke with nursing and cardiologist prior to seeing pt and was cleared to see pt despite some lab value issues.  Pt knows little English but wished to not have interpretor and communicate with assist from daughter in the room.  She showed good effort but is highly limited at this time (unable to even achieve standing with max assist secondary to poor LE and postural strength) and at baseline is able to walk >100 ft with [facility] relatively easily. Discussed with daughter about likely needing rehab despite desire to take her home if possible.   Follow Up Recommendations SNF    Equipment Recommendations  Rolling walker with 5" wheels(per progress)    Recommendations for Other Services       Precautions / Restrictions Precautions Precautions: Fall Restrictions Weight Bearing Restrictions: No      Mobility  Bed Mobility Overal bed mobility: Needs Assistance Bed Mobility: Supine to Sit;Sit to Supine     Supine to sit: Max assist Sit to supine: Max assist   General bed mobility comments: Pt showed effort in getting up to sitting but ultimately needed a lot of assist  Transfers Overall transfer level: Needs assistance Equipment used: Rolling walker (2 wheeled) Transfers: Sit to/from Stand Sit to Stand: Total assist         General transfer comment: attempted to get to standing but pt too weak to be able to straighten knees or attain  standing even with very heavy assist  Ambulation/Gait                Stairs            Wheelchair Mobility    Modified Rankin (Stroke Patients Only)       Balance Overall balance assessment: Needs assistance Sitting-balance support: Bilateral upper extremity supported Sitting balance-Leahy Scale: Poor Sitting balance - Comments: Pt leaning relatively heavily to the R, only briefly able to maintain straight/upright sitting when assisted and quickly would slump back to the R     Standing balance-Leahy Scale: Zero Standing balance comment: unable                             Pertinent Vitals/Pain Pain Assessment: (general R side pain from fall that brought her in)    Home Living Family/patient expects to be discharged to:: Private residence Living Arrangements: Children Available Help at Discharge: Family;Available 24 hours/day   Home Access: Stairs to enter   Entrance Stairs-Number of Steps: 1 (large step)   Home Equipment: Cane - single point;Walker - 4 wheels      Prior Function Level of Independence: Independent with assistive device(s)         Comments: 1-2 weeks ago pt was able to walk several 100 ft with [facility]     Hand Dominance        Extremity/Trunk Assessment   Upper Extremity Assessment Upper Extremity Assessment: Generalized weakness(almost no L should mvt, R elev <70 AROM, weak t/o)  Lower Extremity Assessment Lower Extremity Assessment: Generalized weakness       Communication   Communication: Prefers language other than English  Cognition Arousal/Alertness: Lethargic Behavior During Therapy: (pt sleepy, able to interact with daughter assisting) Overall Cognitive Status: Difficult to assess                                 General Comments: Pt with some baseline dementia, more sleepy/tired since hospitalization      General Comments      Exercises General Exercises - Lower Extremity Ankle  Circles/Pumps: AROM;5 reps Quad Sets: Strengthening;5 reps Heel Slides: AAROM;5 reps Hip ABduction/ADduction: AROM;5 reps   Assessment/Plan    PT Assessment Patient needs continued PT services  PT Problem List Decreased strength;Decreased range of motion;Decreased activity tolerance;Decreased balance;Decreased mobility;Decreased coordination;Decreased cognition;Decreased knowledge of use of DME;Decreased safety awareness       PT Treatment Interventions Gait training;Stair training;Functional mobility training;Therapeutic activities;Therapeutic exercise;Balance training;Neuromuscular re-education;Cognitive remediation;Patient/family education;DME instruction    PT Goals (Current goals can be found in the Care Plan section)  Acute Rehab PT Goals Patient Stated Goal: get back to walking with cane PT Goal Formulation: With family Time For Goal Achievement: 06/08/17 Potential to Achieve Goals: Fair    Frequency Min 2X/week   Barriers to discharge        Co-evaluation               AM-PAC PT "6 Clicks" Daily Activity  Outcome Measure Difficulty turning over in bed (including adjusting bedclothes, sheets and blankets)?: Unable Difficulty moving from lying on back to sitting on the side of the bed? : Unable Difficulty sitting down on and standing up from a chair with arms (e.g., wheelchair, bedside commode, etc,.)?: Unable Help needed moving to and from a bed to chair (including a wheelchair)?: Total Help needed walking in hospital room?: Total Help needed climbing 3-5 steps with a railing? : Total 6 Click Score: 6    End of Session Equipment Utilized During Treatment: Gait belt Activity Tolerance: Patient limited by lethargy Patient left: in bed;with call bell/phone within reach;with family/visitor present   PT Visit Diagnosis: Muscle weakness (generalized) (M62.81);Difficulty in walking, not elsewhere classified (R26.2)    Time: 1497-0263 PT Time Calculation (min)  (ACUTE ONLY): 21 min   Charges:   PT Evaluation $PT Eval Low Complexity: 1 Low     PT G Codes:        Kreg Shropshire, DPT 05/25/2017, 2:13 PM

## 2017-05-25 NOTE — Clinical Social Work Placement (Signed)
   CLINICAL SOCIAL WORK PLACEMENT  NOTE  Date:  05/25/2017  Patient Details  Name: Ariana Miller MRN: [id number] Date of Birth: 1923/06/19  Clinical Social Work is seeking post-discharge placement for this patient at the [place] level of care (*CSW will initial, date and re-position this form in  chart as items are completed):  Yes   Patient/family provided with [facility] Work Department's list of facilities offering this level of care within the geographic area requested by the patient (or if unable, by the patient's family).  Yes   Patient/family informed of their freedom to choose among providers that offer the needed level of care, that participate in Medicare, Medicaid or managed care program needed by the patient, have an available bed and are willing to accept the patient.  Yes   Patient/family informed of [facility]'s ownership interest in [facility] and [facility], as well as of the fact that they are under no obligation to receive care at these facilities.  PASRR submitted to EDS on 05/25/17     PASRR number received on       Existing PASRR number confirmed on 05/25/17     FL2 transmitted to all facilities in geographic area requested by pt/family on 05/25/17     FL2 transmitted to all facilities within larger geographic area on       Patient informed that his/her managed care company has contracts with or will negotiate with certain facilities, including the following:            Patient/family informed of bed offers received.  Patient chooses bed at       Physician recommends and patient chooses bed at      Patient to be transferred to   on  .  Patient to be transferred to facility by       Patient family notified on   of transfer.  Name of family member notified:        PHYSICIAN       Additional Comment:    _______________________________________________ Annamaria Boots, [place] 05/25/2017, 2:37 PM

## 2017-05-25 NOTE — Progress Notes (Signed)
Progress Note  Patient Name: Ariana Miller Date of Encounter: 05/25/2017  Primary Cardiologist: New to [facility]  Subjective   No events overnight, denies any chest pain or shortness of breath Supine in bed, Right arm and leg hurts to palpation Still on heparin infusion   Inpatient Medications    Scheduled Meds: . aspirin  81 mg Oral Daily  . ferrous sulfate  325 mg Oral Q breakfast  . insulin aspart  0-9 Units Subcutaneous TID WC  . mouth rinse  15 mL Mouth Rinse BID  . pneumococcal 23 valent vaccine  0.5 mL Intramuscular Tomorrow-1000   Continuous Infusions: . 0.9 % NaCl with KCl 20 mEq / L 50 mL/hr at 05/25/17 0105  . ampicillin (OMNIPEN) IV 2 g (05/25/17 0626)  . heparin 850 Units/hr (05/25/17 0105)  . magnesium sulfate 1 - 4 g bolus IVPB    . potassium PHOSPHATE IVPB (mmol)    . vancomycin Stopped (05/24/17 2101)   PRN Meds: acetaminophen **OR** acetaminophen, ibuprofen, LORazepam, ondansetron **OR** ondansetron (ZOFRAN) IV, polyethylene glycol   Vital Signs    Vitals:   05/24/17 1456 05/24/17 2021 05/25/17 0305 05/25/17 0433  BP: (!) 141/59 131/60  (!) 127/40  Pulse: 80 88  84  Resp: 20 16  19   Temp: 99 F (37.2 C) 98.7 F (37.1 C) (!) 100.8 F (38.2 C) 98.2 F (36.8 C)  TempSrc: Oral Oral Oral Oral  SpO2: 100% 99%  98%  Weight:    158 lb 1.1 oz (71.7 kg)    Intake/Output Summary (Last 24 hours) at 05/25/2017 1007 Last data filed at 05/25/2017 0105 Gross per 24 hour  Intake 2173.01 ml  Output -  Net 2173.01 ml   Filed Weights   05/23/17 1156 05/24/17 0419 05/25/17 0433  Weight: 151 lb 14.4 oz (68.9 kg) 157 lb 13.6 oz (71.6 kg) 158 lb 1.1 oz (71.7 kg)    Telemetry    NSR- Personally Reviewed  ECG      Physical Exam   GEN: No acute distress.  Frail supine in bed Neck: No JVD Cardiac: RRR, 1-2 SEM RSB, no rubs, or gallops.  Respiratory: Clear to auscultation bilaterally. GI: Soft, nontender, non-distended  MS: No edema; No  deformity. Neuro:  Nonfocal  Psych: Normal affect   Labs    Chemistry Recent Labs  Lab 05/23/17 0713 05/23/17 1217 05/24/17 0221 05/24/17 0502 05/25/17 0417  NA 131*  --  134*  --  132*  K 3.0*  --  3.1* 3.3* 3.4*  CL 98*  --  102  --  102  CO2 21*  --  23  --  23  GLUCOSE 201*  --  84  --  101*  BUN 16  --  17  --  17  CREATININE 0.85 0.94 0.83  --  0.84  CALCIUM 8.3*  --  7.4*  --  6.9*  PROT 8.4*  --  6.8  --   --   ALBUMIN 3.5  --  2.7*  --   --   AST 46*  --  51*  --   --   ALT 20  --  21  --   --   ALKPHOS 127*  --  77  --   --   BILITOT 2.1*  --  1.0  --   --   GFRNONAA 58* 51* 59*  --  59*  GFRAA >60 59* >60  --  >60  ANIONGAP 12  --  9  --  7     Hematology Recent Labs  Lab 05/23/17 1217 05/24/17 0221 05/25/17 0417  WBC 23.1* 25.3* 15.4*  RBC 3.36* 3.08* 2.63*  HGB 10.3* 9.5* 8.2*  HCT 31.6* 29.0* 24.3*  MCV 93.9 94.0 92.5  MCH 30.7 30.9 31.0  MCHC 32.7 32.8 33.5  RDW 17.9* 18.0* 18.3*  PLT 62* 61* 56*    Cardiac Enzymes Recent Labs  Lab 05/23/17 1217 05/23/17 1616 05/23/17 2231 05/24/17 1011  TROPONINI 1.03* 2.00* 2.82* 2.93*   No results for input(s): TROPIPOC in the last 168 hours.   BNPNo results for input(s): BNP, PROBNP in the last 168 hours.   DDimer No results for input(s): DDIMER in the last 168 hours.   Radiology    Mr Brain Wo Contrast  Result Date: 05/23/2017 CLINICAL DATA:  Altered level of consciousness, unexplained EXAM: MRI HEAD WITHOUT CONTRAST TECHNIQUE: Multiplanar, multiecho pulse sequences of the brain and surrounding structures were obtained without intravenous contrast. COMPARISON:  Head CT from earlier today FINDINGS: Severely motion degraded sagittal T1, diffusion, axial T2, axial FLAIR was obtained. No restricted diffusion seen on both axial and coronal scans. Major flow voids are preserved. There is no hydrocephalus or midline shift. The study was terminated early due to concerns for patient's safety. IMPRESSION:  1. No acute finding. 2. Partial and severely motion degraded study due to altered mental status. Electronically Signed   By: Monte Fantasia M.D.   On: 05/23/2017 15:44    Cardiac Studies   Echo - Left ventricle: The cavity size was normal. Systolic function was   normal. The estimated ejection fraction was in the range of 55%   to 60%. Regional wall motion abnormalities cannot be excluded.   Features are consistent with a pseudonormal left ventricular   filling pattern, with concomitant abnormal relaxation and   increased filling pressure (grade 2 diastolic dysfunction). - Aortic valve: A bioprosthesis was present. Mean gradient (S): 10   mm Hg. - Mitral valve: There was moderate regurgitation. - Left atrium: The atrium was moderately dilated. - Right ventricle: Systolic function was normal. - Pulmonary arteries: Systolic pressure was mild to moderately   elevated. PA peak pressure: 49 mm Hg (S).  Patient Profile     84 year old woman known to our group team previously for inpatient consultation March 2019, history of coronary artery disease CABG 2009, severe aortic valve stenosis with aortic valve replacement, pulmonary fibrosis, PAD with carotid endarterectomy on the left, type 2 diabetes  Presenting  after a fall May 21,  In the emergency room felt to have a seizure, was febrile temperature 102,  Started on vancomycin and Rocephin,  Hypertensive systolic pressure 297L, tachycardic heart rate more than 110 bpm   in the setting of fever, Troponin up to 2.9    Assessment & Plan    A/P: 1.NSTEMI/CAD s/p CABG: Stressors on arrival included tachycardia, febrile, hypertensive, sepsis with bacteremia, anemia, known coronary artery disease, hx of CABG -No unstable angina sx Normal ejection fraction though unable to exclude regional wall motion abnormality hypokinesis in the inferior wall Not a candidate for invasive cardiac testing given her advanced age, comorbid conditions  including dementia, acute illness , language barrier --Long discussion with daughter at the bedside this morning All of the above was discussed with her. she is in agreement.  ----Would DC heparin infusion tomorrow Am  2. Severe aortic stenosis s/p AVR: Well-appearing aortic valve, no significant gradient  3. Sepsis with GPC bacteremia: On antibiotics Cultures and sensitivity pending  4. Anemia: Followed by primary care dating back several years  5. Interstitial pulmonary fibrosis No respiratory distress noted,  X-ray with diffuse interstitial infiltrates   Total encounter time more than 45 minutes  Greater than 50% was spent in counseling and coordination of care with the patient    For questions or updates, please contact [facility] Please consult [email or web address] for contact info under Cardiology/STEMI.      Signed, Ida Rogue, MD  05/25/2017, 10:07 AM

## 2017-05-25 NOTE — Plan of Care (Signed)

## 2017-05-25 NOTE — Progress Notes (Signed)
Pharmacy Electrolyte Monitoring Consult:  Pharmacy consulted to assist in monitoring and replacing electrolytes in this 84 y.o. female admitted on 05/23/2017 with witnessed seizure and being treated for sepsis/possible meningitis.   Patient currently ordered NaCl w/KCL 64mEq at 12mL/hr.   Labs:  Sodium (mmol/L)  Date Value  05/25/2017 132 (L)  01/05/2012 135 (L)   Potassium (mmol/L)  Date Value  05/25/2017 3.4 (L)  01/05/2012 3.8   Magnesium (mg/dL)  Date Value  05/25/2017 1.9   Phosphorus (mg/dL)  Date Value  05/25/2017 2.3 (L)   Calcium (mg/dL)  Date Value  05/25/2017 6.9 (L)   Calcium, Total (mg/dL)  Date Value  01/05/2012 7.8 (L)   Albumin (g/dL)  Date Value  05/24/2017 2.7 (L)  01/05/2012 3.3 (L)    Assessment/Plan: 5/23 AM: Corrected calcium: 7.94, Mg= 1.9,  Phos= 2.3  K=3.4: .MIVF NS/54mEq KCL at 26mL/hr still infusing. Goal potassium ~ 4 and goal magnesium ~ 2 in setting seizure.   Will order Magnesium sulfate 1 gram IV x 1. Will order Potassium Phosphate 10 mmol IV x 1. Will order KCL 62mEq IV x 2 doses.   Recheck all electrolytes with AM labs.   Pernell Dupre, PharmD, BCPS Clinical Pharmacist 05/25/2017 7:06 AM

## 2017-05-25 NOTE — NC FL2 (Signed)
[place] LEVEL OF CARE SCREENING TOOL     IDENTIFICATION  Patient Name: Ariana Miller Birthdate: Jan 18, 1923 Sex: female Admission Date (Current Location): 05/23/2017  [place] and [place] Number:  Engineering geologist and Address:  [facility], [address] [place], [place] [address]      Provider Number: [id number]  Attending Physician Name and Address:  Saundra Shelling, MD  Relative Name and Phone Number:       Current Level of Care: Hospital Recommended Level of Care: [place] Prior Approval Number:    Date Approved/Denied:   PASRR Number: [id number]  Discharge Plan: SNF    Current Diagnoses: Patient Active Problem List   Diagnosis Date Noted  . Sepsis ([place]) 05/23/2017  . Dyspnea 03/15/2017  . Hypokalemia 03/15/2017  . Benign essential hypertension 10/30/2015  . Rheumatic disease of heart valve 10/27/2015  . Weakness 10/19/2015  . Pressure injury of skin 10/19/2015  . Interstitial pulmonary fibrosis ([place]) 03/12/2015  . Reactive airway disease that is not asthma 03/02/2015  . Hyponatremia 03/02/2015  . Diabetes mellitus type 2, controlled, without complications ([place]) 54/62/7035  . Accelerated hypertension 03/02/2015  . CAD (coronary artery disease) 03/02/2015  . Hyperlipidemia, mixed 02/26/2015    Orientation RESPIRATION BLADDER Height & Weight     Self  Normal Incontinent Weight: 158 lb 1.1 oz (71.7 kg) Height:     BEHAVIORAL SYMPTOMS/MOOD NEUROLOGICAL BOWEL NUTRITION STATUS  (None) (None) Incontinent Diet(Dysphagia 1 )  AMBULATORY STATUS COMMUNICATION OF NEEDS Skin   Extensive Assist Verbally Normal                       Personal Care Assistance Level of Assistance  Bathing, Feeding, Dressing Bathing Assistance: Limited assistance Feeding assistance: Limited assistance Dressing Assistance: Limited assistance     Functional Limitations Info  Sight, Hearing, Speech Sight Info:  Adequate Hearing Info: Adequate Speech Info: Adequate    SPECIAL CARE FACTORS FREQUENCY  PT (By licensed PT), OT (By licensed OT), Speech therapy                    Contractures Contractures Info: Not present    Additional Factors Info  Code Status, Allergies Code Status Info: DNR Allergies Info: NKA           Current Medications (05/25/2017):  This is the current hospital active medication list Current Facility-Administered Medications  Medication Dose Route Frequency Provider Last Rate Last Dose  . 0.9 % NaCl with KCl 20 mEq/ L  infusion   Intravenous Continuous Bettey Costa, MD 50 mL/hr at 05/25/17 0105    . acetaminophen (TYLENOL) tablet 650 mg  650 mg Oral Q6H PRN Bettey Costa, MD   650 mg at 05/25/17 0308   Or  . acetaminophen (TYLENOL) suppository 650 mg  650 mg Rectal Q6H PRN Bettey Costa, MD      . ampicillin (OMNIPEN) 2 g in sodium chloride 0.9 % 100 mL IVPB  2 g Intravenous Q6H Saundra Shelling, MD   Stopped at 05/25/17 1247  . aspirin chewable tablet 81 mg  81 mg Oral Daily Mody, Sital, MD      . ferrous sulfate tablet 325 mg  325 mg Oral Q breakfast Mody, Sital, MD      . heparin ADULT infusion 100 units/mL (25000 units/210mL sodium chloride 0.45%)  850 Units/hr Intravenous Continuous Coffee, Garrett, RPH 8.5 mL/hr at 05/25/17 0105 850 Units/hr at 05/25/17 0105  . ibuprofen (ADVIL,MOTRIN)  tablet 400 mg  400 mg Oral Q6H PRN Mody, Sital, MD      . insulin aspart (novoLOG) injection 0-9 Units  0-9 Units Subcutaneous TID WC Bettey Costa, MD   1 Units at 05/23/17 1755  . LORazepam (ATIVAN) injection 0.5 mg  0.5 mg Intravenous Q6H PRN Dustin Flock, MD   0.5 mg at 05/24/17 2339  . MEDLINE mouth rinse  15 mL Mouth Rinse BID Saundra Shelling, MD   15 mL at 05/24/17 2002  . ondansetron (ZOFRAN) tablet 4 mg  4 mg Oral Q6H PRN Bettey Costa, MD       Or  . ondansetron (ZOFRAN) injection 4 mg  4 mg Intravenous Q6H PRN Mody, Sital, MD      . pneumococcal 23 valent vaccine  (PNU-IMMUNE) injection 0.5 mL  0.5 mL Intramuscular Tomorrow-1000 Mody, Sital, MD      . polyethylene glycol (MIRALAX / GLYCOLAX) packet 17 g  17 g Oral Daily PRN Mody, Sital, MD      . potassium PHOSPHATE 10 mmol in dextrose 5 % 250 mL infusion  10 mmol Intravenous Once Hallaji, Sheema M, RPH 42 mL/hr at 05/25/17 1410 10 mmol at 05/25/17 1410  . vancomycin (VANCOCIN) IVPB 1000 mg/200 mL premix  1,000 mg Intravenous Q24H Bettey Costa, MD   Stopped at 05/24/17 2101     Discharge Medications: Please see discharge summary for a list of discharge medications.  Relevant Imaging Results:  Relevant Lab Results:   Additional Information Only Speaks Spanish. SSN [id number]  Annamaria Boots, LCSWA

## 2017-05-25 NOTE — Clinical Social Work Note (Signed)
Clinical Social Work Assessment  Patient Details  Name: Ariana Miller MRN: [id number] Date of Birth: 12/19/1923  Date of referral:  05/25/17               Reason for consult:  Facility Placement                Permission sought to share information with:  Case Manager, Customer service manager, Family Supports Permission granted to share information::  Yes, Verbal Permission Granted  Name::      SNF   Agency::   [facility]  Relationship::     Contact Information:     Housing/Transportation Living arrangements for the past 2 months:  Single Family Home Source of Information:  Adult Children Patient Interpreter Needed:  None Criminal Activity/Legal Involvement Pertinent to Current Situation/Hospitalization:  No - Comment as needed Significant Relationships:  Adult Children, Other Family Members Lives with:  Adult Children Do you feel safe going back to the place where you live?  Yes Need for family participation in patient care:  Yes (Comment)  Care giving concerns:  Patient lives with daughter in [place]. Patient only speaks [place].    Social Worker assessment / plan:  CSW notified by PT that patient will need SNF at discharge. CSW met with patient and daughter Ariana Miller [phone] in room. Daughter stated that patient is confused and unable to communicate. CSW explained role and asked if patient would need an interpreter. Daughter stated that she would not because of confusion she would not be able to participate. CSW explained to daughter that PT has recommended SNF at discharge. CSW explained SNF rehab and the different facilities in this area. Daughter states that she is aware of facilities and is in agreement with SNF at discharge. Daughter reports that she used to work as an Engineer, production for seniors with Dementia. Daughter also reports that she cares for her mom at home and before this hospitalization patient was fairly independent. Daughter reports that patient was able  to move and walk independently and she was able to get around the house on her own. CSW explained bed search process. Daughter agreed to bed search and states that she would like patient to go somewhere that daughter could stay with her if needed. CSW explained that those decisions would be up to the facility. CSW will initiate bed search and give offers when available. CSW will continue to follow for discharge planning.   Employment status:  Retired Forensic scientist:  Medicare PT Recommendations:  [place] / Referral to community resources:  [place]  Patient/Family's Response to care:  Patient unable to participate due to confusion   Patient/Family's Understanding of and Emotional Response to Diagnosis, Current Treatment, and Prognosis:  Daughter is very aware of patient's current prognosis. She is very involved. Daughter thanked CSW for assistance   Emotional Assessment Appearance:  Appears stated age Attitude/Demeanor/Rapport:  Unresponsive Affect (typically observed):  Unable to Assess Orientation:  Oriented to Self Alcohol / Substance use:  Never Used Psych involvement (Current and /or in the community):  No (Comment)  Discharge Needs  Concerns to be addressed:  Discharge Planning Concerns Readmission within the last 30 days:  No Current discharge risk:  Cognitively Impaired, Dependent with Mobility Barriers to Discharge:  Continued Medical Work up   [employer], [place] 05/25/2017, 2:18 PM

## 2017-05-25 NOTE — Progress Notes (Signed)
Verbal order by Dr. Estanislado Pandy to discontinue telemetry monitor.  Clarise Cruz, RN

## 2017-05-26 LAB — CBC
HCT: 24.5 % — ABNORMAL LOW (ref 35.0–47.0)
HEMOGLOBIN: 8.1 g/dL — AB (ref 12.0–16.0)
MCH: 30.7 pg (ref 26.0–34.0)
MCHC: 33.1 g/dL (ref 32.0–36.0)
MCV: 92.9 fL (ref 80.0–100.0)
Platelets: 62 10*3/uL — ABNORMAL LOW (ref 150–440)
RBC: 2.64 MIL/uL — AB (ref 3.80–5.20)
RDW: 18.3 % — ABNORMAL HIGH (ref 11.5–14.5)
WBC: 11.1 10*3/uL — ABNORMAL HIGH (ref 3.6–11.0)

## 2017-05-26 LAB — GLUCOSE, CAPILLARY
GLUCOSE-CAPILLARY: 146 mg/dL — AB (ref 65–99)
Glucose-Capillary: 103 mg/dL — ABNORMAL HIGH (ref 65–99)
Glucose-Capillary: 124 mg/dL — ABNORMAL HIGH (ref 65–99)
Glucose-Capillary: 147 mg/dL — ABNORMAL HIGH (ref 65–99)

## 2017-05-26 LAB — BASIC METABOLIC PANEL
ANION GAP: 3 — AB (ref 5–15)
BUN: 15 mg/dL (ref 6–20)
CHLORIDE: 108 mmol/L (ref 101–111)
CO2: 24 mmol/L (ref 22–32)
CREATININE: 0.78 mg/dL (ref 0.44–1.00)
Calcium: 7.1 mg/dL — ABNORMAL LOW (ref 8.9–10.3)
GFR calc Af Amer: >60 mL/min (ref >60)
GFR calc non Af Amer: >60 mL/min (ref >60)
Glucose, Bld: 113 mg/dL — ABNORMAL HIGH (ref 65–99)
Potassium: 3.8 mmol/L (ref 3.5–5.1)
Sodium: 135 mmol/L (ref 135–145)

## 2017-05-26 LAB — CULTURE, BLOOD (ROUTINE X 2): Special Requests: ADEQUATE

## 2017-05-26 LAB — MAGNESIUM: Magnesium: 2 mg/dL (ref 1.7–2.4)

## 2017-05-26 LAB — PHOSPHORUS: Phosphorus: 2.5 mg/dL (ref 2.5–4.6)

## 2017-05-26 LAB — HEPARIN LEVEL (UNFRACTIONATED): HEPARIN UNFRACTIONATED: 0.39 [IU]/mL (ref 0.30–0.70)

## 2017-05-26 MED ORDER — DOCUSATE SODIUM 100 MG PO CAPS
100.0000 mg | ORAL_CAPSULE | Freq: Two times a day (BID) | ORAL | Status: DC
  Administered 2017-05-26 – 2017-05-29 (×7): 100 mg via ORAL
  Filled 2017-05-26 (×7): qty 1

## 2017-05-26 MED ORDER — METOPROLOL SUCCINATE ER 25 MG PO TB24
12.5000 mg | ORAL_TABLET | Freq: Every day | ORAL | Status: DC
  Administered 2017-05-26 – 2017-05-27 (×2): 12.5 mg via ORAL
  Filled 2017-05-26 (×2): qty 1

## 2017-05-26 MED ORDER — ALUM & MAG HYDROXIDE-SIMETH 200-200-20 MG/5ML PO SUSP
15.0000 mL | Freq: Four times a day (QID) | ORAL | Status: DC | PRN
  Administered 2017-05-28 – 2017-05-29 (×2): 15 mL via ORAL
  Filled 2017-05-26 (×2): qty 30

## 2017-05-26 MED ORDER — ENOXAPARIN SODIUM 40 MG/0.4ML ~~LOC~~ SOLN
40.0000 mg | SUBCUTANEOUS | Status: DC
  Administered 2017-05-26 – 2017-05-28 (×3): 40 mg via SUBCUTANEOUS
  Filled 2017-05-26 (×3): qty 0.4

## 2017-05-26 NOTE — Progress Notes (Signed)
Patient ID: Ariana Miller, female   DOB: Nov 13, 1923, 84 y.o.   MRN: [id number]  [facility] PROGRESS NOTE  Ariana Miller JJH:[id number] DOB: December 11, 1923 DOA: 05/23/2017 PCP: Ariana Aus, MD  HPI/Subjective: Patient feeling better.  Still feeling weak.  As per daughter she still needs some help feeding herself.  Objective: Vitals:   05/25/17 2001 05/26/17 0440  BP: (!) 177/61 (!) 130/51  Pulse: 100 77  Resp: 17 17  Temp: 99.6 F (37.6 C) 98.3 F (36.8 C)  SpO2: 100% 98%    Filed Weights   05/24/17 0419 05/25/17 0433 05/26/17 0500  Weight: 71.6 kg (157 lb 13.6 oz) 71.7 kg (158 lb 1.1 oz) 71.7 kg (158 lb)    ROS: Review of Systems  Respiratory: Negative for shortness of breath.   Cardiovascular: Negative for chest pain.  Gastrointestinal: Negative for abdominal pain.  Genitourinary: Negative for hematuria.   Exam: Physical Exam  HENT:  Nose: No mucosal edema.  Mouth/Throat: No oropharyngeal exudate or posterior oropharyngeal edema.  Eyes: Pupils are equal, round, and reactive to light. Conjunctivae, EOM and lids are normal.  Neck: No JVD present. Carotid bruit is not present. No edema present. No thyroid mass and no thyromegaly present.  Cardiovascular: S1 normal and S2 normal. Exam reveals no gallop.  No murmur heard. Pulses:      Dorsalis pedis pulses are 2+ on the right side, and 2+ on the left side.  Respiratory: No respiratory distress. She has decreased breath sounds in the right lower field and the left lower field. She has no wheezes. She has no rhonchi. She has no rales.  GI: Soft. Bowel sounds are normal. There is no tenderness.  Musculoskeletal:       Right ankle: She exhibits no swelling.       Left ankle: She exhibits no swelling.  Lymphadenopathy:    She has no cervical adenopathy.  Neurological: She is alert. No cranial nerve deficit.  Skin: Skin is warm. No rash noted. Nails show no clubbing.  Psychiatric: She has a normal mood and affect.       Data Reviewed: Basic Metabolic Panel: Recent Labs  Lab 05/23/17 0713 05/23/17 1217 05/24/17 0221 05/24/17 0502 05/25/17 0417 05/26/17 0457  NA 131*  --  134*  --  132* 135  K 3.0*  --  3.1* 3.3* 3.4* 3.8  CL 98*  --  102  --  102 108  CO2 21*  --  23  --  23 24  GLUCOSE 201*  --  84  --  101* 113*  BUN 16  --  17  --  17 15  CREATININE 0.85 0.94 0.83  --  0.84 0.78  CALCIUM 8.3*  --  7.4*  --  6.9* 7.1*  MG  --  1.3* 2.5*  --  1.9 2.0  PHOS  --  1.6*  --  3.4 2.3* 2.5   Liver Function Tests: Recent Labs  Lab 05/23/17 0713 05/24/17 0221  AST 46* 51*  ALT 20 21  ALKPHOS 127* 77  BILITOT 2.1* 1.0  PROT 8.4* 6.8  ALBUMIN 3.5 2.7*   CBC: Recent Labs  Lab 05/23/17 0713 05/23/17 1217 05/24/17 0221 05/25/17 0417 05/26/17 0457  WBC 15.5* 23.1* 25.3* 15.4* 11.1*  NEUTROABS 13.9*  --   --   --   --   HGB 11.8* 10.3* 9.5* 8.2* 8.1*  HCT 35.7 31.6* 29.0* 24.3* 24.5*  MCV 93.6 93.9 94.0 92.5 92.9  PLT 81*  62* 61* 56* 62*   Cardiac Enzymes: Recent Labs  Lab 05/23/17 0713 05/23/17 1217 05/23/17 1616 05/23/17 2231 05/24/17 1011  CKTOTAL 42  --   --   --   --   TROPONINI 0.06* 1.03* 2.00* 2.82* 2.93*   BNP (last 3 results) Recent Labs    03/14/17 2225  BNP 608.0*    CBG: Recent Labs  Lab 05/25/17 1151 05/25/17 1719 05/25/17 2052 05/26/17 0802 05/26/17 1144  GLUCAP 152* 156* 171* 103* 124*    Recent Results (from the past 240 hour(s))  Culture, blood (Routine x 2)     Status: Abnormal   Collection Time: 05/23/17  7:13 AM  Result Value Ref Range Status   Specimen Description   Final    BLOOD LEFT AC Performed at [facility], [address] [place], [place] [address]    Special Requests   Final    BOTTLES DRAWN AEROBIC AND ANAEROBIC Blood Culture adequate volume Performed at [facility], [address] [place], [place] [address]    Culture  Setup Time   Final    GRAM POSITIVE COCCI IN BOTH AEROBIC AND ANAEROBIC  BOTTLES CRITICAL RESULT CALLED TO, READ BACK BY AND VERIFIED WITH: JASON ROBBINS @1546  05/23/17 AKT    Culture (A)  Final    GROUP B STREP(S.AGALACTIAE)ISOLATED SUSCEPTIBILITIES PERFORMED ON PREVIOUS CULTURE WITHIN THE LAST 5 DAYS. Performed at [facility], [place] [address], [place], [place] [address]    Report Status 05/26/2017 FINAL  Final  Blood Culture ID Panel (Reflexed)     Status: Abnormal   Collection Time: 05/23/17  7:13 AM  Result Value Ref Range Status   Enterococcus species NOT DETECTED NOT DETECTED Final   Listeria monocytogenes NOT DETECTED NOT DETECTED Final   Staphylococcus species NOT DETECTED NOT DETECTED Final   Staphylococcus aureus NOT DETECTED NOT DETECTED Final   Streptococcus species DETECTED (A) NOT DETECTED Final    Comment: CRITICAL RESULT CALLED TO, READ BACK BY AND VERIFIED WITH: JASON ROBBINS @1546  05/23/17 AKT    Streptococcus agalactiae DETECTED (A) NOT DETECTED Final    Comment: CRITICAL RESULT CALLED TO, READ BACK BY AND VERIFIED WITH: JASON ROBBINS @1546  05/23/17 AKT    Streptococcus pneumoniae NOT DETECTED NOT DETECTED Final   Streptococcus pyogenes NOT DETECTED NOT DETECTED Final   Acinetobacter baumannii NOT DETECTED NOT DETECTED Final   Enterobacteriaceae species NOT DETECTED NOT DETECTED Final   Enterobacter cloacae complex NOT DETECTED NOT DETECTED Final   Escherichia coli NOT DETECTED NOT DETECTED Final   Klebsiella oxytoca NOT DETECTED NOT DETECTED Final   Klebsiella pneumoniae NOT DETECTED NOT DETECTED Final   Proteus species NOT DETECTED NOT DETECTED Final   Serratia marcescens NOT DETECTED NOT DETECTED Final   Haemophilus influenzae NOT DETECTED NOT DETECTED Final   Neisseria meningitidis NOT DETECTED NOT DETECTED Final   Pseudomonas aeruginosa NOT DETECTED NOT DETECTED Final   Candida albicans NOT DETECTED NOT DETECTED Final   Candida glabrata NOT DETECTED NOT DETECTED Final   Candida krusei NOT DETECTED NOT DETECTED Final    Candida parapsilosis NOT DETECTED NOT DETECTED Final   Candida tropicalis NOT DETECTED NOT DETECTED Final    Comment: Performed at [facility], [address] [place],  [address]  Culture, blood (Routine x 2)     Status: Abnormal   Collection Time: 05/23/17  7:45 AM  Result Value Ref Range Status   Specimen Description   Final    BLOOD RIGHT HAND  Performed at [facility], [address], [place], [place] [address]    Special Requests   Final    BOTTLES DRAWN AEROBIC AND ANAEROBIC Blood Culture results may not be optimal due to an excessive volume of blood received in culture bottles Performed at [facility], [address], [place], [place] [address]    Culture  Setup Time   Final    GRAM POSITIVE COCCI IN BOTH AEROBIC AND ANAEROBIC BOTTLES CRITICAL RESULT CALLED TO, READ BACK BY AND VERIFIED WITH: JASON ROBBINS @1546  05/23/17 AKT Performed at [facility], [place] [address] , [place] [address]    Culture GROUP B STREP(S.AGALACTIAE)ISOLATED (A)  Final   Report Status 05/26/2017 FINAL  Final   Organism ID, Bacteria GROUP B STREP(S.AGALACTIAE)ISOLATED  Final      Susceptibility   Group b strep(s.agalactiae)isolated - MIC*    CLINDAMYCIN >=1 RESISTANT Resistant     AMPICILLIN <=0.25 SENSITIVE Sensitive     ERYTHROMYCIN >=8 RESISTANT Resistant     VANCOMYCIN 0.5 SENSITIVE Sensitive     CEFTRIAXONE <=0.12 SENSITIVE Sensitive     LEVOFLOXACIN 0.5 SENSITIVE Sensitive     * GROUP B STREP(S.AGALACTIAE)ISOLATED  CSF culture     Status: None (Preliminary result)   Collection Time: 05/23/17  9:57 AM  Result Value Ref Range Status   Specimen Description   Final    CSF Performed at [facility], [address] [place], [place] [address]    Special Requests   Final    CSF Performed at [facility], [address] [place], [place] [address]    Gram Stain   Final    NO ORGANISMS SEEN RARE WBC RARE RBC Performed at  [facility], [address], [place], [place] [address]    Culture   Final    NO GROWTH 2 DAYS Performed at [facility], [place] [address] [place], [place] [address]    Report Status PENDING  Incomplete     Studies: Dg Pelvis Portable  Result Date: 05/25/2017 CLINICAL DATA:  Pelvic pain EXAM: PORTABLE PELVIS 1-2 VIEWS COMPARISON:  CT 10/19/2015 FINDINGS: Chronic degenerative disc disease L3-4 with mild flattening of the L4 and L5 vertebral body similar to prior CT. Osteoarthritis of the sacroiliac joints with sclerosis. No acute pelvic fracture. Slight joint space narrowing of both hips without fracture or malalignment. Fine bony detail somewhat limited due to the patient's overlying pannus. IMPRESSION: Chronic degenerative disc disease L3-4 with slight chronic vertebral flattening of L4 on L5. Mild osteoarthritic sclerosis of the SI joints. No acute pelvic or proximal femoral fracture. Electronically Signed   By: Ashley Royalty M.D.   On: 05/25/2017 20:01    Scheduled Meds: . aspirin  81 mg Oral Daily  . docusate sodium  100 mg Oral BID  . ferrous sulfate  325 mg Oral Q breakfast  . insulin aspart  0-9 Units Subcutaneous TID WC  . mouth rinse  15 mL Mouth Rinse BID  . pneumococcal 23 valent vaccine  0.5 mL Intramuscular Tomorrow-1000   Continuous Infusions: . ampicillin (OMNIPEN) IV Stopped (05/26/17 1257)  . vancomycin Stopped (05/25/17 2205)    Assessment/Plan:  1. Sepsis with group B Streptococcus agalactiae.  Sensitivities are back so I will get rid of vancomycin and continue ampicillin.  Infectious disease consultation for length of therapy and whether we can switch over to oral or not. 2. NSTEMI.  Stop heparin drip today.  Continue aspirin.  Add low-dose  Toprol-XL at night 3. Type 2 diabetes mellitus.  Patient has refused insulin.  Sugars are good.  Just to follow with diet control only. 4. Acute encephalopathy which is metabolic secondary to sepsis.  This has  improved 5. Weakness.  Currently physical therapy recommended rehab.  Patient would like to go home.  I told the patient if she wants to go home she needs to walk better with physical therapy. 6. Seizure.  No recurrence.  MRI of the brain negative.  EEG showed no seizure. 7. Anemia patient on iron.  Stop IV fluids and heparin drip.  Monitor hemoglobin. 8. Thrombocytopenia probably secondary to sepsis.  Continue to monitor.  Code Status:     Code Status Orders  (From admission, onward)        Start     Ordered   05/23/17 1127  Do not attempt resuscitation (DNR)  Continuous    Question Answer Comment  In the event of cardiac or respiratory ARREST Do not call a "code blue"   In the event of cardiac or respiratory ARREST Do not perform Intubation, CPR, defibrillation or ACLS   In the event of cardiac or respiratory ARREST Use medication by any route, position, wound care, and other measures to relive pain and suffering. May use oxygen, suction and manual treatment of airway obstruction as needed for comfort.   Comments RN may pronounce      05/23/17 1126    Code Status History    Date Active Date Inactive Code Status Order ID Comments User Context   03/15/2017 0256 03/16/2017 1743 DNR [id number]  Lance Coon, MD Inpatient   10/19/2015 1122 10/20/2015 1751 DNR [id number]  Nicholes Mango, MD Inpatient   10/19/2015 1014 10/19/2015 1121 Full Code [id number]  Harvie Bridge, DO Inpatient   03/03/2015 0127 03/05/2015 1702 Full Code [id number]  Lance Coon, MD Inpatient     Family Communication: Spoke with daughter at the bedside Disposition Plan: In order to go home with home health she needs to walk better.  Consultants:  Cardiology  Infectious disease  Antibiotics:  Ampicillin  Time spent: 35 minutes.  Translator provided by hospital.    [facility]

## 2017-05-26 NOTE — Progress Notes (Signed)
Pharmacy Electrolyte Monitoring Consult:  Pharmacy consulted to assist in monitoring and replacing electrolytes in this 84 y.o. female admitted on 05/23/2017 with witnessed seizure and being treated for sepsis/possible meningitis.   Patient currently ordered NaCl w/KCL 71mEq at 1mL/hr.    Labs:  Sodium (mmol/L)  Date Value  05/26/2017 135  01/05/2012 135 (L)   Potassium (mmol/L)  Date Value  05/26/2017 3.8  01/05/2012 3.8   Magnesium (mg/dL)  Date Value  05/26/2017 2.0   Phosphorus (mg/dL)  Date Value  05/26/2017 2.5   Calcium (mg/dL)  Date Value  05/26/2017 7.1 (L)   Calcium, Total (mg/dL)  Date Value  01/05/2012 7.8 (L)   Albumin (g/dL)  Date Value  05/24/2017 2.7 (L)  01/05/2012 3.3 (L)    Assessment/Plan: 5/23 AM: Corrected calcium: 8.14, Mg= 2.0,  Phos= 2.5  K=3.8: Goal potassium ~ 4 and goal magnesium ~ 2 in setting seizure. MIVF NS/1mEq KCL at 37mL/hr still infusing. No additional replacement needed at this time.   Recheck all electrolytes with AM labs.   Pernell Dupre, PharmD, BCPS Clinical Pharmacist 05/26/2017 7:23 AM

## 2017-05-26 NOTE — Progress Notes (Signed)
ANTICOAGULATION CONSULT NOTE - Initial Consult  Pharmacy Consult for heparin gtt Indication: chest pain/ACS  No Known Allergies  Patient Measurements: Weight: 158 lb 1.1 oz (71.7 kg) Heparin Dosing Weight: 68.9kg  Vital Signs: Temp: 98.3 F (36.8 C) (05/24 0440) Temp Source: Oral (05/24 0440) BP: 130/51 (05/24 0440) Pulse Rate: 77 (05/24 0440)  Labs: Recent Labs    05/23/17 0713  05/23/17 1616 05/23/17 1814 05/23/17 2231  05/24/17 0221 05/24/17 1011 05/25/17 0417 05/26/17 0457  HGB 11.8*   < >  --   --   --   --  9.5*  --  8.2* 8.1*  HCT 35.7   < >  --   --   --   --  29.0*  --  24.3* 24.5*  PLT 81*   < >  --   --   --   --  61*  --  56* 62*  APTT  --   --   --  35  --   --   --   --   --   --   LABPROT 15.1  --   --  16.1*  --   --   --   --   --   --   INR 1.20  --   --  1.30  --   --   --   --   --   --   HEPARINUNFRC  --   --   --   --   --    < > 0.59 0.40 0.36 0.39  CREATININE 0.85   < >  --   --   --   --  0.83  --  0.84 0.78  CKTOTAL 42  --   --   --   --   --   --   --   --   --   TROPONINI 0.06*   < > 2.00*  --  2.82*  --   --  2.93*  --   --    < > = values in this interval not displayed.    Estimated Creatinine Clearance: 39.7 mL/min (by C-G formula based on SCr of 0.78 mg/dL).   Medical History: Past Medical History:  Diagnosis Date  . Anemia   . Arthritis   . Asthma   . Bladder cancer ([place])   . CAD (coronary artery disease)    a. 2-V CABG 03/2007 (LIMA-LAD, SVG-D1)  . Diabetes mellitus with complication ([place])   . Diastolic heart failure ([place])   . History of gastric ulcer   . HTN (hypertension)   . Pulmonary fibrosis ([place])   . Severe aortic stenosis    a. s/p [facility] AVR 03/2007; b. echo 2009 (pre-AVR): nl LVEF, mild LVH, severe AS, mild MS, trivial AR/MR/TR/PR, nl RVSF    Medications:  Medications Prior to Admission  Medication Sig Dispense Refill Last Dose  . aspirin EC 81 MG tablet Take 81 mg by mouth daily.   05/21/2017 at  2000  . ferrous sulfate 325 (65 FE) MG tablet Take 325 mg by mouth daily with breakfast.   05/21/2017 at 2000  . glimepiride (AMARYL) 1 MG tablet Take 0.5 mg by mouth daily as needed (high blood sugar).    05/23/2017 at 0630  . benzonatate (TESSALON PERLES) 100 MG capsule Take 1 capsule (100 mg total) by mouth 3 (three) times daily as needed for cough. (Patient not taking: Reported on 05/23/2017) 30 capsule 0 Completed Course at Unknown time  .  guaiFENesin-dextromethorphan (ROBITUSSIN DM) 100-10 MG/5ML syrup Take 5 mLs by mouth every 4 (four) hours as needed for cough. (Patient not taking: Reported on 05/23/2017) 118 mL 0 Not Taking at Unknown time   Scheduled:  . aspirin  81 mg Oral Daily  . ferrous sulfate  325 mg Oral Q breakfast  . insulin aspart  0-9 Units Subcutaneous TID WC  . mouth rinse  15 mL Mouth Rinse BID  . pneumococcal 23 valent vaccine  0.5 mL Intramuscular Tomorrow-1000   Infusions:  . 0.9 % NaCl with KCl 20 mEq / L 50 mL/hr at 05/26/17 0300  . ampicillin (OMNIPEN) IV Stopped (05/26/17 0230)  . heparin 850 Units/hr (05/26/17 0300)  . vancomycin Stopped (05/25/17 2205)   PRN: acetaminophen **OR** acetaminophen, ibuprofen, LORazepam, ondansetron **OR** ondansetron (ZOFRAN) IV, polyethylene glycol Anti-infectives (From admission, onward)   Start     Dose/Rate Route Frequency Ordered Stop   05/24/17 1230  ampicillin (OMNIPEN) 2 g in sodium chloride 0.9 % 100 mL IVPB     2 g 300 mL/hr over 20 Minutes Intravenous Every 6 hours 05/24/17 1227     05/23/17 2200  cefTRIAXone (ROCEPHIN) 2 g in sodium chloride 0.9 % 100 mL IVPB  Status:  Discontinued     2 g 200 mL/hr over 30 Minutes Intravenous Every 12 hours 05/23/17 1131 05/24/17 1206   05/23/17 2000  vancomycin (VANCOCIN) IVPB 1000 mg/200 mL premix     1,000 mg 200 mL/hr over 60 Minutes Intravenous Every 24 hours 05/23/17 1128     05/23/17 1800  ampicillin (OMNIPEN) 2 g in sodium chloride 0.9 % 100 mL IVPB  Status:  Discontinued      2 g 300 mL/hr over 20 Minutes Intravenous Every 6 hours 05/23/17 1140 05/24/17 1206   05/23/17 1145  ampicillin (OMNIPEN) 2 g in sodium chloride 0.9 % 100 mL IVPB     2 g 300 mL/hr over 20 Minutes Intravenous  Once 05/23/17 1140 05/23/17 1836   05/23/17 1130  cefTRIAXone (ROCEPHIN) 2 g in sodium chloride 0.9 % 100 mL IVPB  Status:  Discontinued     2 g 200 mL/hr over 30 Minutes Intravenous Every 24 hours 05/23/17 1126 05/23/17 1131   05/23/17 1015  cefTRIAXone (ROCEPHIN) 2 g in sodium chloride 0.9 % 100 mL IVPB     2 g 200 mL/hr over 30 Minutes Intravenous  Once 05/23/17 1008 05/23/17 1050   05/23/17 1015  vancomycin (VANCOCIN) IVPB 1000 mg/200 mL premix     1,000 mg 200 mL/hr over 60 Minutes Intravenous  Once 05/23/17 1009 05/23/17 1215   05/23/17 1000  cefTRIAXone (ROCEPHIN) 1 g in sodium chloride 0.9 % 100 mL IVPB  Status:  Discontinued     1 g 200 mL/hr over 30 Minutes Intravenous  Once 05/23/17 0955 05/23/17 1008      Assessment: 84 year old female with ACS requiring heparin gtt per pharmacy.   Goal of Therapy:  Heparin level 0.3-0.7 units/ml Monitor platelets by anticoagulation protocol: Yes   Plan:  05/23 @ 0417 HL 0.36 therapeutic. Will continue current rate and will recheck HL w/ am labs. hgb and platelets steadily trending down. Will continue to monitor.  05/24 @ 0500 HL 0.39 therapeutic. Will continue current rate and will recheck HL w/ am labs.  Tobie Lords, PharmD, BCPS Clinical Pharmacist 05/26/2017

## 2017-05-26 NOTE — Care Management Important Message (Signed)
Important Message  Patient Details  Name: Ariana Miller MRN: [id number] Date of Birth: Dec 24, 1923   Medicare Important Message Given:  Yes    [name] 05/26/2017, 10:39 AM

## 2017-05-26 NOTE — Consult Note (Signed)
[facility] Infectious Disease     Reason for Consult: Bacteremia   Referring Physician: Earleen Newport Date of Admission:  05/23/2017   Active Problems:   Sepsis [facility])   HPI: Ariana Miller is a 84 y.o. female Admitted with fall and weakness.  She has a history of mild dementia, chronic anemia, bladder cancer, coronary artery disease and CABG as well as diastolic heart failure and diabetes.  In the emergency room patient was found to have a generalized tonic-clonic seizure.  She was also febrile to 102.  She had a lumbar puncture done and was started on vancomycin and ceftriaxone.  Since then cultures have turned positive for group B strep.Since admission she has defervesced and her white count has come from 25,000 down to 11.  No follow-up blood cultures have been done.  CSF culture was negative.  MRI of the brain showed no acute findings.  She does have a history of a bioprosthetic aortic valve.  She had an echocardiogram transthoracic that did not show any vegetations.  Past Medical History:  Diagnosis Date  . Anemia   . Arthritis   . Asthma   . Bladder cancer ([place])   . CAD (coronary artery disease)    a. 2-V CABG 03/2007 (LIMA-LAD, SVG-D1)  . Diabetes mellitus with complication ([place])   . Diastolic heart failure ([place])   . History of gastric ulcer   . HTN (hypertension)   . Pulmonary fibrosis ([place])   . Severe aortic stenosis    a. s/p Carpentier-Edwards AVR 03/2007; b. echo 2009 (pre-AVR): nl LVEF, mild LVH, severe AS, mild MS, trivial AR/MR/TR/PR, nl RVSF   Past Surgical History:  Procedure Laterality Date  . AORTIC VALVE REPLACEMENT (AVR)/CORONARY ARTERY BYPASS GRAFTING (CABG)    . CARDIAC VALVE REPLACEMENT    . CAROTID ENDARTERECTOMY    . CHOLECYSTECTOMY    . CORONARY ARTERY BYPASS GRAFT    . CYSTOSCOPY W/ RETROGRADES Bilateral 12/21/2015   Procedure: CYSTOSCOPY WITH RETROGRADE PYELOGRAM;  Surgeon: Hollice Espy, MD;  Location: [facility] ORS;  Service: Urology;  Laterality:  Bilateral;  . EYE SURGERY Right    Catract Extraction with IOL  . TRANSURETHRAL RESECTION OF BLADDER TUMOR WITH MITOMYCIN-C N/A 12/21/2015   Procedure: TRANSURETHRAL RESECTION OF BLADDER TUMOR WITH MITOMYCIN-C;  Surgeon: Hollice Espy, MD;  Location: [facility] ORS;  Service: Urology;  Laterality: N/A;   Social History   Tobacco Use  . Smoking status: Former Smoker    Types: Cigarettes  . Smokeless tobacco: Never Used  Substance Use Topics  . Alcohol use: No  . Drug use: No   Family History  Problem Relation Age of Onset  . Hepatitis Mother   . Liver disease Father   . Asthma Unknown   . Osteoporosis Unknown   . Hypertension Unknown   . Bladder Cancer Neg Hx   . Kidney cancer Neg Hx     Allergies: No Known Allergies  Current antibiotics: Antibiotics Given (last 72 hours)    Date/Time Action Medication Dose Rate   05/23/17 1813 New Bag/Given   ampicillin (OMNIPEN) 2 g in sodium chloride 0.9 % 100 mL IVPB 2 g 300 mL/hr   05/23/17 2241 New Bag/Given  [waiting on additional PIV]   vancomycin (VANCOCIN) IVPB 1000 mg/200 mL premix 1,000 mg 200 mL/hr   05/23/17 2320 New Bag/Given  [discussed with pharmacy Shanon Brow) rocephin and vanc compatible]   cefTRIAXone (ROCEPHIN) 2 g in sodium chloride 0.9 % 100 mL IVPB 2 g 200 mL/hr   05/24/17  0005 New Bag/Given   ampicillin (OMNIPEN) 2 g in sodium chloride 0.9 % 100 mL IVPB 2 g 300 mL/hr   05/24/17 0526 New Bag/Given   ampicillin (OMNIPEN) 2 g in sodium chloride 0.9 % 100 mL IVPB 2 g 300 mL/hr   05/24/17 1311 New Bag/Given   cefTRIAXone (ROCEPHIN) 2 g in sodium chloride 0.9 % 100 mL IVPB 2 g 200 mL/hr   05/24/17 1504 New Bag/Given   ampicillin (OMNIPEN) 2 g in sodium chloride 0.9 % 100 mL IVPB 2 g 300 mL/hr   05/24/17 2000 New Bag/Given   ampicillin (OMNIPEN) 2 g in sodium chloride 0.9 % 100 mL IVPB 2 g 300 mL/hr   05/24/17 2001 New Bag/Given   vancomycin (VANCOCIN) IVPB 1000 mg/200 mL premix 1,000 mg 200 mL/hr   05/25/17 0100 New  Bag/Given   ampicillin (OMNIPEN) 2 g in sodium chloride 0.9 % 100 mL IVPB 2 g 300 mL/hr   05/25/17 0626 New Bag/Given   ampicillin (OMNIPEN) 2 g in sodium chloride 0.9 % 100 mL IVPB 2 g 300 mL/hr   05/25/17 1227 New Bag/Given   ampicillin (OMNIPEN) 2 g in sodium chloride 0.9 % 100 mL IVPB 2 g 300 mL/hr   05/25/17 2105 New Bag/Given  [pt preference]   vancomycin (VANCOCIN) IVPB 1000 mg/200 mL premix 1,000 mg 200 mL/hr   05/25/17 2105 New Bag/Given   ampicillin (OMNIPEN) 2 g in sodium chloride 0.9 % 100 mL IVPB 2 g 300 mL/hr   05/26/17 0210 New Bag/Given   ampicillin (OMNIPEN) 2 g in sodium chloride 0.9 % 100 mL IVPB 2 g 300 mL/hr   05/26/17 0636 New Bag/Given   ampicillin (OMNIPEN) 2 g in sodium chloride 0.9 % 100 mL IVPB 2 g 300 mL/hr   05/26/17 1155 New Bag/Given   ampicillin (OMNIPEN) 2 g in sodium chloride 0.9 % 100 mL IVPB 2 g 300 mL/hr      MEDICATIONS: . aspirin  81 mg Oral Daily  . docusate sodium  100 mg Oral BID  . enoxaparin (LOVENOX) injection  40 mg Subcutaneous Q24H  . ferrous sulfate  325 mg Oral Q breakfast  . mouth rinse  15 mL Mouth Rinse BID  . metoprolol succinate  12.5 mg Oral QHS  . pneumococcal 23 valent vaccine  0.5 mL Intramuscular Tomorrow-1000    Review of Systems - 11 systems reviewed and negative per HPI   OBJECTIVE: Temp:  [98.3 F (36.8 C)-99.6 F (37.6 C)] 98.3 F (36.8 C) (05/24 0440) Pulse Rate:  [77-100] 77 (05/24 0440) Resp:  [17-18] 17 (05/24 0440) BP: (130-177)/(51-61) 130/51 (05/24 0440) SpO2:  [98 %-100 %] 98 % (05/24 0440) Weight:  [71.7 kg (158 lb)] 71.7 kg (158 lb) (05/24 0500)   LABS: Results for orders placed or performed during the hospital encounter of 05/23/17 (from the past 48 hour(s))  Glucose, capillary     Status: Abnormal   Collection Time: 05/24/17 10:13 PM  Result Value Ref Range   Glucose-Capillary 102 (H) 65 - 99 mg/dL  Procalcitonin     Status: None   Collection Time: 05/25/17  4:17 AM  Result Value Ref  Range   Procalcitonin 17.03 ng/mL    Comment:        Interpretation: PCT >= 10 ng/mL: Important systemic inflammatory response, almost exclusively due to severe bacterial sepsis or septic shock. (NOTE)       Sepsis PCT Algorithm           Lower Respiratory Tract  Infection PCT Algorithm    ----------------------------     ----------------------------         PCT < 0.25 ng/mL                PCT < 0.10 ng/mL         Strongly encourage             Strongly discourage   discontinuation of antibiotics    initiation of antibiotics    ----------------------------     -----------------------------       PCT 0.25 - 0.50 ng/mL            PCT 0.10 - 0.25 ng/mL               OR       >80% decrease in PCT            Discourage initiation of                                            antibiotics      Encourage discontinuation           of antibiotics    ----------------------------     -----------------------------         PCT >= 0.50 ng/mL              PCT 0.26 - 0.50 ng/mL                AND       <80% decrease in PCT             Encourage initiation of                                             antibiotics       Encourage continuation           of antibiotics    ----------------------------     -----------------------------        PCT >= 0.50 ng/mL                  PCT > 0.50 ng/mL               AND         increase in PCT                  Strongly encourage                                      initiation of antibiotics    Strongly encourage escalation           of antibiotics                                     -----------------------------                                           PCT <= 0.25 ng/mL                                                   OR                                        > 80% decrease in PCT                                     Discontinue / Do not initiate                                             antibiotics Performed at  [facility], [address] [place], [place] [address]   Basic metabolic panel     Status: Abnormal   Collection Time: 05/25/17  4:17 AM  Result Value Ref Range   Sodium 132 (L) 135 - 145 mmol/L   Potassium 3.4 (L) 3.5 - 5.1 mmol/L   Chloride 102 101 - 111 mmol/L   CO2 23 22 - 32 mmol/L   Glucose, Bld 101 (H) 65 - 99 mg/dL   BUN 17 6 - 20 mg/dL   Creatinine, Ser 0.84 0.44 - 1.00 mg/dL   Calcium 6.9 (L) 8.9 - 10.3 mg/dL   GFR calc non Af Amer 59 (L) >60 mL/min   GFR calc Af Amer >60 >60 mL/min    Comment: (NOTE) The eGFR has been calculated using the CKD EPI equation. This calculation has not been validated in all clinical situations. eGFR's persistently <60 mL/min signify possible Chronic Kidney Disease.    Anion gap 7 5 - 15    Comment: Performed at [facility], [address] [place], [place] [address]  Magnesium     Status: None   Collection Time: 05/25/17  4:17 AM  Result Value Ref Range   Magnesium 1.9 1.7 - 2.4 mg/dL    Comment: Performed at [facility], [address] [place], [place] [address]  Phosphorus     Status: Abnormal   Collection Time: 05/25/17  4:17 AM  Result Value Ref Range   Phosphorus 2.3 (L) 2.5 - 4.6 mg/dL    Comment: Performed at [facility], [place], [place] [address]  Heparin level (unfractionated)     Status: None   Collection Time: 05/25/17  4:17 AM  Result Value Ref Range   Heparin Unfractionated 0.36 0.30 - 0.70 IU/mL    Comment: (NOTE) If heparin results are below expected values, and patient dosage has  been confirmed, suggest follow up testing of antithrombin III levels. Performed at [facility], [address] [place], [place] [address]   CBC     Status: Abnormal   Collection Time: 05/25/17  4:17 AM  Result Value Ref Range   WBC 15.4 (H) 3.6 - 11.0 K/uL   RBC 2.63 (L) 3.80 - 5.20 MIL/uL   Hemoglobin 8.2 (L) 12.0 - 16.0 g/dL   HCT 24.3 (L) 35.0 - 47.0 %   MCV  92.5 80.0 - 100.0 fL   MCH 31.0 26.0 - 34.0 pg   MCHC 33.5 32.0 - 36.0 g/dL   RDW 18.3 (H) 11.5 - 14.5 %   Platelets 56 (L) 150 - 440 K/uL    Comment: Performed at [facility], [address], [place], [place] [address]  Glucose, capillary  Status: Abnormal   Collection Time: 05/25/17  8:06 AM  Result Value Ref Range   Glucose-Capillary 101 (H) 65 - 99 mg/dL  Glucose, capillary     Status: Abnormal   Collection Time: 05/25/17 11:51 AM  Result Value Ref Range   Glucose-Capillary 152 (H) 65 - 99 mg/dL  Glucose, capillary     Status: Abnormal   Collection Time: 05/25/17  5:19 PM  Result Value Ref Range   Glucose-Capillary 156 (H) 65 - 99 mg/dL  Glucose, capillary     Status: Abnormal   Collection Time: 05/25/17  8:52 PM  Result Value Ref Range   Glucose-Capillary 171 (H) 65 - 99 mg/dL  Heparin level (unfractionated)     Status: None   Collection Time: 05/26/17  4:57 AM  Result Value Ref Range   Heparin Unfractionated 0.39 0.30 - 0.70 IU/mL    Comment: (NOTE) If heparin results are below expected values, and patient dosage has  been confirmed, suggest follow up testing of antithrombin III levels. Performed at [facility], [address] [place], [place] [address]   Phosphorus     Status: None   Collection Time: 05/26/17  4:57 AM  Result Value Ref Range   Phosphorus 2.5 2.5 - 4.6 mg/dL    Comment: Performed at [facility], [address] [place], [place] [address]  Magnesium     Status: None   Collection Time: 05/26/17  4:57 AM  Result Value Ref Range   Magnesium 2.0 1.7 - 2.4 mg/dL    Comment: Performed at [facility], [address] [place], [place] [address]  Basic metabolic panel     Status: Abnormal   Collection Time: 05/26/17  4:57 AM  Result Value Ref Range   Sodium 135 135 - 145 mmol/L   Potassium 3.8 3.5 - 5.1 mmol/L   Chloride 108 101 - 111 mmol/L   CO2 24 22 - 32 mmol/L   Glucose, Bld 113 (H) 65 - 99 mg/dL   BUN  15 6 - 20 mg/dL   Creatinine, Ser 0.78 0.44 - 1.00 mg/dL   Calcium 7.1 (L) 8.9 - 10.3 mg/dL   GFR calc non Af Amer >60 >60 mL/min   GFR calc Af Amer >60 >60 mL/min    Comment: (NOTE) The eGFR has been calculated using the CKD EPI equation. This calculation has not been validated in all clinical situations. eGFR's persistently <60 mL/min signify possible Chronic Kidney Disease.    Anion gap 3 (L) 5 - 15    Comment: Performed at [facility], [address] [place], [place] [address]  CBC     Status: Abnormal   Collection Time: 05/26/17  4:57 AM  Result Value Ref Range   WBC 11.1 (H) 3.6 - 11.0 K/uL   RBC 2.64 (L) 3.80 - 5.20 MIL/uL   Hemoglobin 8.1 (L) 12.0 - 16.0 g/dL   HCT 24.5 (L) 35.0 - 47.0 %   MCV 92.9 80.0 - 100.0 fL   MCH 30.7 26.0 - 34.0 pg   MCHC 33.1 32.0 - 36.0 g/dL   RDW 18.3 (H) 11.5 - 14.5 %   Platelets 62 (L) 150 - 440 K/uL    Comment: Performed at [facility], [address] [place],  [address]  Glucose, capillary     Status: Abnormal   Collection Time: 05/26/17  8:02 AM  Result Value Ref Range   Glucose-Capillary 103 (H) 65 - 99 mg/dL  Glucose, capillary  Status: Abnormal   Collection Time: 05/26/17 11:44 AM  Result Value Ref Range   Glucose-Capillary 124 (H) 65 - 99 mg/dL   No components found for: ESR, C REACTIVE PROTEIN MICRO: Recent Results (from the past 720 hour(s))  Culture, blood (Routine x 2)     Status: Abnormal   Collection Time: 05/23/17  7:13 AM  Result Value Ref Range Status   Specimen Description   Final    BLOOD LEFT AC Performed at [facility], [address] [place], [place] [address]    Special Requests   Final    BOTTLES DRAWN AEROBIC AND ANAEROBIC Blood Culture adequate volume Performed at [facility], [address], [place], [place] [address]    Culture  Setup Time   Final    GRAM POSITIVE COCCI IN BOTH AEROBIC AND ANAEROBIC BOTTLES CRITICAL RESULT CALLED TO, READ BACK  BY AND VERIFIED WITH: JASON ROBBINS '@1546'$  05/23/17 AKT    Culture (A)  Final    GROUP B STREP(S.AGALACTIAE)ISOLATED SUSCEPTIBILITIES PERFORMED ON PREVIOUS CULTURE WITHIN THE LAST 5 DAYS. Performed at [facility], [place] [address], [place], [place] [address]    Report Status 05/26/2017 FINAL  Final  Blood Culture ID Panel (Reflexed)     Status: Abnormal   Collection Time: 05/23/17  7:13 AM  Result Value Ref Range Status   Enterococcus species NOT DETECTED NOT DETECTED Final   Listeria monocytogenes NOT DETECTED NOT DETECTED Final   Staphylococcus species NOT DETECTED NOT DETECTED Final   Staphylococcus aureus NOT DETECTED NOT DETECTED Final   Streptococcus species DETECTED (A) NOT DETECTED Final    Comment: CRITICAL RESULT CALLED TO, READ BACK BY AND VERIFIED WITH: JASON ROBBINS '@1546'$  05/23/17 AKT    Streptococcus agalactiae DETECTED (A) NOT DETECTED Final    Comment: CRITICAL RESULT CALLED TO, READ BACK BY AND VERIFIED WITH: JASON ROBBINS '@1546'$  05/23/17 AKT    Streptococcus pneumoniae NOT DETECTED NOT DETECTED Final   Streptococcus pyogenes NOT DETECTED NOT DETECTED Final   Acinetobacter baumannii NOT DETECTED NOT DETECTED Final   Enterobacteriaceae species NOT DETECTED NOT DETECTED Final   Enterobacter cloacae complex NOT DETECTED NOT DETECTED Final   Escherichia coli NOT DETECTED NOT DETECTED Final   Klebsiella oxytoca NOT DETECTED NOT DETECTED Final   Klebsiella pneumoniae NOT DETECTED NOT DETECTED Final   Proteus species NOT DETECTED NOT DETECTED Final   Serratia marcescens NOT DETECTED NOT DETECTED Final   Haemophilus influenzae NOT DETECTED NOT DETECTED Final   Neisseria meningitidis NOT DETECTED NOT DETECTED Final   Pseudomonas aeruginosa NOT DETECTED NOT DETECTED Final   Candida albicans NOT DETECTED NOT DETECTED Final   Candida glabrata NOT DETECTED NOT DETECTED Final   Candida krusei NOT DETECTED NOT DETECTED Final   Candida parapsilosis NOT DETECTED NOT DETECTED  Final   Candida tropicalis NOT DETECTED NOT DETECTED Final    Comment: Performed at [facility], [address] [place], [place] [address]  Culture, blood (Routine x 2)     Status: Abnormal   Collection Time: 05/23/17  7:45 AM  Result Value Ref Range Status   Specimen Description   Final    BLOOD RIGHT HAND Performed at [facility], [address] [place],  [address]    Special Requests   Final    BOTTLES DRAWN AEROBIC AND ANAEROBIC Blood Culture results may not be optimal due to an excessive volume of blood received in culture bottles Performed at [facility], [address] [place], [place] [address]    Culture  Setup Time   Final    GRAM POSITIVE COCCI IN BOTH AEROBIC AND ANAEROBIC BOTTLES CRITICAL RESULT CALLED TO, READ BACK BY AND VERIFIED WITH: JASON ROBBINS '@1546'$  05/23/17 AKT Performed at [facility], [place] [address] [place], [place] [address]    Culture GROUP B STREP(S.AGALACTIAE)ISOLATED (A)  Final   Report Status 05/26/2017 FINAL  Final   Organism ID, Bacteria GROUP B STREP(S.AGALACTIAE)ISOLATED  Final      Susceptibility   Group b strep(s.agalactiae)isolated - MIC*    CLINDAMYCIN >=1 RESISTANT Resistant     AMPICILLIN <=0.25 SENSITIVE Sensitive     ERYTHROMYCIN >=8 RESISTANT Resistant     VANCOMYCIN 0.5 SENSITIVE Sensitive     CEFTRIAXONE <=0.12 SENSITIVE Sensitive     LEVOFLOXACIN 0.5 SENSITIVE Sensitive     * GROUP B STREP(S.AGALACTIAE)ISOLATED  CSF culture     Status: None (Preliminary result)   Collection Time: 05/23/17  9:57 AM  Result Value Ref Range Status   Specimen Description   Final    CSF Performed at [facility], [address], [place], [place] [address]    Special Requests   Final    CSF Performed at [facility], [address] [place], [place] [address]    Gram Stain   Final    NO ORGANISMS SEEN RARE WBC RARE RBC Performed at [facility], [address]  [place], [place] [address]    Culture   Final    NO GROWTH 2 DAYS Performed at [facility], [place] [address] [place],  [address]    Report Status PENDING  Incomplete    IMAGING: Dg Chest 2 View  Result Date: 05/23/2017 CLINICAL DATA:  Found on floor, unable to get up on her own, history of stroke with LEFT-sided weakness, dementia, former smoker EXAM: CHEST - 2 VIEW COMPARISON:  03/14/2017 FINDINGS: Upper normal heart size post CABG and AVR. Atherosclerotic calcification aorta. Peribronchial thickening with diffuse interstitial changes likely representing pulmonary edema, little changed. Slight decrease in lung volumes since previous exam. No pleural effusion or pneumothorax. Marked osseous demineralization with nonunion of an old proximal LEFT humeral fracture and prior RIGHT shoulder replacement. IMPRESSION: Diffuse interstitial infiltrates likely representing pulmonary edema, little changed. Electronically Signed   By: Lavonia Dana M.D.   On: 05/23/2017 08:05   Ct Head Wo Contrast  Result Date: 05/23/2017 CLINICAL DATA:  Head trauma with ataxia.  Initial encounter. EXAM: CT HEAD WITHOUT CONTRAST TECHNIQUE: Contiguous axial images were obtained from the base of the skull through the vertex without intravenous contrast. COMPARISON:  10/20/2015 FINDINGS: Brain: No evidence of acute infarction, hemorrhage, hydrocephalus, extra-axial collection or mass lesion/mass effect. Left frontal cortically based calcification, chronic and incidental. Age normal brain volume and white matter appearance Vascular: No hyperdense vessel or unexpected calcification. Skull: Normal. Negative for fracture or focal lesion. Sinuses/Orbits: No acute finding. IMPRESSION: No evidence of injury.  Age normal head CT. Electronically Signed   By: Monte Fantasia M.D.   On: 05/23/2017 07:58   Mr Brain Wo Contrast  Result Date: 05/23/2017 CLINICAL DATA:  Altered level of consciousness, unexplained EXAM: MRI HEAD WITHOUT  CONTRAST TECHNIQUE: Multiplanar, multiecho pulse sequences of the brain and surrounding structures were obtained without intravenous contrast. COMPARISON:  Head CT from earlier today FINDINGS: Severely motion degraded sagittal T1, diffusion, axial T2, axial FLAIR was obtained. No restricted diffusion seen on both axial and coronal scans. Major flow  voids are preserved. There is no hydrocephalus or midline shift. The study was terminated early due to concerns for patient's safety. IMPRESSION: 1. No acute finding. 2. Partial and severely motion degraded study due to altered mental status. Electronically Signed   By: Monte Fantasia M.D.   On: 05/23/2017 15:44   Dg Pelvis Portable  Result Date: 05/25/2017 CLINICAL DATA:  Pelvic pain EXAM: PORTABLE PELVIS 1-2 VIEWS COMPARISON:  CT 10/19/2015 FINDINGS: Chronic degenerative disc disease L3-4 with mild flattening of the L4 and L5 vertebral body similar to prior CT. Osteoarthritis of the sacroiliac joints with sclerosis. No acute pelvic fracture. Slight joint space narrowing of both hips without fracture or malalignment. Fine bony detail somewhat limited due to the patient's overlying pannus. IMPRESSION: Chronic degenerative disc disease L3-4 with slight chronic vertebral flattening of L4 on L5. Mild osteoarthritic sclerosis of the SI joints. No acute pelvic or proximal femoral fracture. Electronically Signed   By: Ashley Royalty M.D.   On: 05/25/2017 20:01    Study Conclusions  - Left ventricle: The cavity size was normal. Systolic function was   normal. The estimated ejection fraction was in the range of 55%   to 60%. Regional wall motion abnormalities cannot be excluded.   Features are consistent with a pseudonormal left ventricular   filling pattern, with concomitant abnormal relaxation and   increased filling pressure (grade 2 diastolic dysfunction). - Aortic valve: A bioprosthesis was present. Mean gradient (S): 10   mm Hg. - Mitral valve: There was  moderate regurgitation. - Left atrium: The atrium was moderately dilated. - Right ventricle: Systolic function was normal. - Pulmonary arteries: Systolic pressure was mild to moderately   elevated. PA peak pressure: 49 mm Hg (S).  Assessment:   CHRISTENA SUNDERLIN is a 84 y.o. female Admitted with a fall and fever and found to have group B strep bacteremia sensitive to ampicillin and ceftriaxone.  She does have a history of a bioprosthetic aortic valve.  Transthoracic echocardiogram is negative. Group B strep would be an unusual cause of  prosthetic valve endocarditis although it could be possible. A decision will have to be made whether to subject this 84 year old female to a transesophageal echocardiogram and prolonged IV antibiotics.  If it was not for the prosthetic valve she could be treated with oral prolonged antibiotics. After discussion with daughter I think the most reasonable thing to do at this time is to treat with IV ampicillin while inpatient and then tranisition to oral amoxicillin for a prolonged oral course Recommendations Follow clinically for fevers.  Repeat bcx Would rec a 6 week course of oral amoxicillin   Thank you very much for allowing me to participate in the care of this patient. Please call with questions.   Cheral Marker. Ola Spurr, MD

## 2017-05-26 NOTE — Clinical Social Work Note (Signed)
CSW spoke to patient's daughter Jana Half [phone], and presented bed offers.  Patient's daughter requested [facility] [place] [place].  Patient's daughter asked if a translation service can be provided for patient since she only speaks [place], CSW asked [facility], and they do provide a telephone translator system.  CSW relayed this information to patient's daughter and she was appreciative.  CSW to continue to follow patient's progress throughout discharge planning.  Jones Broom. Norval Morton, MSW, [place]  05/26/2017 12:49 PM

## 2017-05-27 LAB — BASIC METABOLIC PANEL
Anion gap: 4 — ABNORMAL LOW (ref 5–15)
BUN: 19 mg/dL (ref 6–20)
CALCIUM: 7.3 mg/dL — AB (ref 8.9–10.3)
CO2: 25 mmol/L (ref 22–32)
Chloride: 108 mmol/L (ref 101–111)
Creatinine, Ser: 0.73 mg/dL (ref 0.44–1.00)
GFR calc Af Amer: >60 mL/min (ref >60)
Glucose, Bld: 128 mg/dL — ABNORMAL HIGH (ref 65–99)
POTASSIUM: 3.5 mmol/L (ref 3.5–5.1)
SODIUM: 137 mmol/L (ref 135–145)

## 2017-05-27 LAB — CSF CULTURE W GRAM STAIN: Gram Stain: NONE SEEN

## 2017-05-27 LAB — GLUCOSE, CAPILLARY
GLUCOSE-CAPILLARY: 107 mg/dL — AB (ref 65–99)
Glucose-Capillary: 142 mg/dL — ABNORMAL HIGH (ref 65–99)
Glucose-Capillary: 186 mg/dL — ABNORMAL HIGH (ref 65–99)
Glucose-Capillary: 175 mg/dL — ABNORMAL HIGH (ref 65–99)

## 2017-05-27 LAB — CSF CULTURE: CULTURE: NO GROWTH

## 2017-05-27 LAB — MAGNESIUM: MAGNESIUM: 1.8 mg/dL (ref 1.7–2.4)

## 2017-05-27 MED ORDER — MAGNESIUM SULFATE 2 GM/50ML IV SOLN
2.0000 g | Freq: Once | INTRAVENOUS | Status: AC
  Administered 2017-05-27: 2 g via INTRAVENOUS
  Filled 2017-05-27: qty 50

## 2017-05-27 MED ORDER — POTASSIUM CHLORIDE CRYS ER 20 MEQ PO TBCR
40.0000 meq | EXTENDED_RELEASE_TABLET | Freq: Once | ORAL | Status: AC
  Administered 2017-05-27: 08:00:00 40 meq via ORAL
  Filled 2017-05-27: qty 2

## 2017-05-27 MED ORDER — TRAZODONE HCL 50 MG PO TABS
50.0000 mg | ORAL_TABLET | Freq: Every day | ORAL | Status: DC
  Administered 2017-05-27: 22:00:00 50 mg via ORAL
  Filled 2017-05-27 (×2): qty 1

## 2017-05-27 MED ORDER — POTASSIUM CHLORIDE CRYS ER 20 MEQ PO TBCR
40.0000 meq | EXTENDED_RELEASE_TABLET | Freq: Two times a day (BID) | ORAL | Status: DC
  Administered 2017-05-27: 40 meq via ORAL
  Filled 2017-05-27: qty 2

## 2017-05-27 NOTE — Plan of Care (Signed)
  Problem: Nutrition: Goal: Adequate nutrition will be maintained Outcome: Progressing  Good PO intake when pt's daughter is in the room (ie. Breakfast and lunch)

## 2017-05-27 NOTE — Progress Notes (Signed)
Patient ID: Ariana Miller, female   DOB: Jun 29, 1923, 84 y.o.   MRN: [id number]   [facility] PROGRESS NOTE  CHASITEE ZENKER FXT:[id number] DOB: Apr 13, 1923 DOA: 05/23/2017 PCP: Rusty Aus, MD  HPI/Subjective: With translator patient answers a few yes or no questions.  States she feels good.  States yes to every answering event of the answer may not be correct.  Objective: Vitals:   05/27/17 0500 05/27/17 1146  BP: (!) 154/43 (!) 134/42  Pulse: 70 68  Resp: 18   Temp: 98.1 F (36.7 C) 98 F (36.7 C)  SpO2: 97% 100%    Filed Weights   05/25/17 0433 05/26/17 0500 05/27/17 0500  Weight: 71.7 kg (158 lb 1.1 oz) 71.7 kg (158 lb) 84.8 kg (187 lb)    ROS: Review of Systems  Constitutional: Negative for fever.  Eyes: Negative for blurred vision.  Respiratory: Negative for shortness of breath.   Cardiovascular: Negative for chest pain.  Gastrointestinal: Negative for abdominal pain.  Genitourinary: Negative for hematuria.  Neurological: Negative for headaches.   Exam: Physical Exam  HENT:  Nose: No mucosal edema.  Mouth/Throat: No oropharyngeal exudate or posterior oropharyngeal edema.  Eyes: Pupils are equal, round, and reactive to light. Conjunctivae, EOM and lids are normal.  Neck: No JVD present. Carotid bruit is not present. No edema present. No thyroid mass and no thyromegaly present.  Cardiovascular: S1 normal and S2 normal. Exam reveals no gallop.  No murmur heard. Pulses:      Dorsalis pedis pulses are 2+ on the right side, and 2+ on the left side.  Respiratory: No respiratory distress. She has decreased breath sounds in the right lower field and the left lower field. She has no wheezes. She has no rhonchi. She has no rales.  GI: Soft. Bowel sounds are normal. There is no tenderness.  Musculoskeletal:       Right ankle: She exhibits no swelling.       Left ankle: She exhibits no swelling.  Lymphadenopathy:    She has no cervical adenopathy.  Neurological:  She is alert. No cranial nerve deficit.  Skin: Skin is warm. No rash noted. Nails show no clubbing.  Psychiatric: She has a normal mood and affect.      Data Reviewed: Basic Metabolic Panel: Recent Labs  Lab 05/23/17 0713 05/23/17 1217 05/24/17 0221 05/24/17 0502 05/25/17 0417 05/26/17 0457 05/27/17 0450  NA 131*  --  134*  --  132* 135 137  K 3.0*  --  3.1* 3.3* 3.4* 3.8 3.5  CL 98*  --  102  --  102 108 108  CO2 21*  --  23  --  23 24 25   GLUCOSE 201*  --  84  --  101* 113* 128*  BUN 16  --  17  --  17 15 19   CREATININE 0.85 0.94 0.83  --  0.84 0.78 0.73  CALCIUM 8.3*  --  7.4*  --  6.9* 7.1* 7.3*  MG  --  1.3* 2.5*  --  1.9 2.0 1.8  PHOS  --  1.6*  --  3.4 2.3* 2.5  --    Liver Function Tests: Recent Labs  Lab 05/23/17 0713 05/24/17 0221  AST 46* 51*  ALT 20 21  ALKPHOS 127* 77  BILITOT 2.1* 1.0  PROT 8.4* 6.8  ALBUMIN 3.5 2.7*   CBC: Recent Labs  Lab 05/23/17 0713 05/23/17 1217 05/24/17 0221 05/25/17 0417 05/26/17 0457  WBC 15.5* 23.1* 25.3* 15.4*  11.1*  NEUTROABS 13.9*  --   --   --   --   HGB 11.8* 10.3* 9.5* 8.2* 8.1*  HCT 35.7 31.6* 29.0* 24.3* 24.5*  MCV 93.6 93.9 94.0 92.5 92.9  PLT 81* 62* 61* 56* 62*   Cardiac Enzymes: Recent Labs  Lab 05/23/17 0713 05/23/17 1217 05/23/17 1616 05/23/17 2231 05/24/17 1011  CKTOTAL 42  --   --   --   --   TROPONINI 0.06* 1.03* 2.00* 2.82* 2.93*   BNP (last 3 results) Recent Labs    03/14/17 2225  BNP 608.0*    CBG: Recent Labs  Lab 05/26/17 1144 05/26/17 1701 05/26/17 2143 05/27/17 0720 05/27/17 1143  GLUCAP 124* 147* 146* 107* 142*    Recent Results (from the past 240 hour(s))  Culture, blood (Routine x 2)     Status: Abnormal   Collection Time: 05/23/17  7:13 AM  Result Value Ref Range Status   Specimen Description   Final    BLOOD LEFT AC Performed at [facility], [address], [place], [place] [address]    Special Requests   Final    BOTTLES DRAWN AEROBIC AND  ANAEROBIC Blood Culture adequate volume Performed at [facility], [address] [place], [place] [address]    Culture  Setup Time   Final    GRAM POSITIVE COCCI IN BOTH AEROBIC AND ANAEROBIC BOTTLES CRITICAL RESULT CALLED TO, READ BACK BY AND VERIFIED WITH: JASON ROBBINS @1546  05/23/17 AKT    Culture (A)  Final    GROUP B STREP(S.AGALACTIAE)ISOLATED SUSCEPTIBILITIES PERFORMED ON PREVIOUS CULTURE WITHIN THE LAST 5 DAYS. Performed at [facility], [place] [address], [place], [place] [address]    Report Status 05/26/2017 FINAL  Final  Blood Culture ID Panel (Reflexed)     Status: Abnormal   Collection Time: 05/23/17  7:13 AM  Result Value Ref Range Status   Enterococcus species NOT DETECTED NOT DETECTED Final   Listeria monocytogenes NOT DETECTED NOT DETECTED Final   Staphylococcus species NOT DETECTED NOT DETECTED Final   Staphylococcus aureus NOT DETECTED NOT DETECTED Final   Streptococcus species DETECTED (A) NOT DETECTED Final    Comment: CRITICAL RESULT CALLED TO, READ BACK BY AND VERIFIED WITH: JASON ROBBINS @1546  05/23/17 AKT    Streptococcus agalactiae DETECTED (A) NOT DETECTED Final    Comment: CRITICAL RESULT CALLED TO, READ BACK BY AND VERIFIED WITH: JASON ROBBINS @1546  05/23/17 AKT    Streptococcus pneumoniae NOT DETECTED NOT DETECTED Final   Streptococcus pyogenes NOT DETECTED NOT DETECTED Final   Acinetobacter baumannii NOT DETECTED NOT DETECTED Final   Enterobacteriaceae species NOT DETECTED NOT DETECTED Final   Enterobacter cloacae complex NOT DETECTED NOT DETECTED Final   Escherichia coli NOT DETECTED NOT DETECTED Final   Klebsiella oxytoca NOT DETECTED NOT DETECTED Final   Klebsiella pneumoniae NOT DETECTED NOT DETECTED Final   Proteus species NOT DETECTED NOT DETECTED Final   Serratia marcescens NOT DETECTED NOT DETECTED Final   Haemophilus influenzae NOT DETECTED NOT DETECTED Final   Neisseria meningitidis NOT DETECTED NOT DETECTED Final    Pseudomonas aeruginosa NOT DETECTED NOT DETECTED Final   Candida albicans NOT DETECTED NOT DETECTED Final   Candida glabrata NOT DETECTED NOT DETECTED Final   Candida krusei NOT DETECTED NOT DETECTED Final   Candida parapsilosis NOT DETECTED NOT DETECTED Final   Candida tropicalis NOT DETECTED NOT DETECTED Final    Comment: Performed at [facility], [address] [place], [place]  [address]  Culture, blood (Routine x 2)     Status: Abnormal   Collection Time: 05/23/17  7:45 AM  Result Value Ref Range Status   Specimen Description   Final    BLOOD RIGHT HAND Performed at [facility], [address] [place], [place] [address]    Special Requests   Final    BOTTLES DRAWN AEROBIC AND ANAEROBIC Blood Culture results may not be optimal due to an excessive volume of blood received in culture bottles Performed at [facility], [address] [place], [place] [address]    Culture  Setup Time   Final    GRAM POSITIVE COCCI IN BOTH AEROBIC AND ANAEROBIC BOTTLES CRITICAL RESULT CALLED TO, READ BACK BY AND VERIFIED WITH: JASON ROBBINS @1546  05/23/17 AKT Performed at [facility], [place] [address], [place], [place] [address]    Culture GROUP B STREP(S.AGALACTIAE)ISOLATED (A)  Final   Report Status 05/26/2017 FINAL  Final   Organism ID, Bacteria GROUP B STREP(S.AGALACTIAE)ISOLATED  Final      Susceptibility   Group b strep(s.agalactiae)isolated - MIC*    CLINDAMYCIN >=1 RESISTANT Resistant     AMPICILLIN <=0.25 SENSITIVE Sensitive     ERYTHROMYCIN >=8 RESISTANT Resistant     VANCOMYCIN 0.5 SENSITIVE Sensitive     CEFTRIAXONE <=0.12 SENSITIVE Sensitive     LEVOFLOXACIN 0.5 SENSITIVE Sensitive     * GROUP B STREP(S.AGALACTIAE)ISOLATED  CSF culture     Status: None (Preliminary result)   Collection Time: 05/23/17  9:57 AM  Result Value Ref Range Status   Specimen Description   Final    CSF Performed at [facility], [address],  [place], [place] [address]    Special Requests   Final    CSF Performed at [facility], [address] [place], [place] [address]    Gram Stain   Final    NO ORGANISMS SEEN RARE WBC RARE RBC Performed at [facility], [address], [place], [place] [address]    Culture   Final    NO GROWTH 2 DAYS Performed at [facility], [place] [address], [place], [place] [address]    Report Status PENDING  Incomplete     Studies: Dg Pelvis Portable  Result Date: 05/25/2017 CLINICAL DATA:  Pelvic pain EXAM: PORTABLE PELVIS 1-2 VIEWS COMPARISON:  CT 10/19/2015 FINDINGS: Chronic degenerative disc disease L3-4 with mild flattening of the L4 and L5 vertebral body similar to prior CT. Osteoarthritis of the sacroiliac joints with sclerosis. No acute pelvic fracture. Slight joint space narrowing of both hips without fracture or malalignment. Fine bony detail somewhat limited due to the patient's overlying pannus. IMPRESSION: Chronic degenerative disc disease L3-4 with slight chronic vertebral flattening of L4 on L5. Mild osteoarthritic sclerosis of the SI joints. No acute pelvic or proximal femoral fracture. Electronically Signed   By: Ashley Royalty M.D.   On: 05/25/2017 20:01    Scheduled Meds: . aspirin  81 mg Oral Daily  . docusate sodium  100 mg Oral BID  . enoxaparin (LOVENOX) injection  40 mg Subcutaneous Q24H  . ferrous sulfate  325 mg Oral Q breakfast  . mouth rinse  15 mL Mouth Rinse BID  . metoprolol succinate  12.5 mg Oral QHS  . pneumococcal 23 valent vaccine  0.5 mL Intramuscular Tomorrow-1000  . potassium chloride  40 mEq Oral BID   Continuous Infusions: . ampicillin (OMNIPEN) IV 2 g (05/27/17 1318)    Assessment/Plan:  1. Sepsis with group B Streptococcus agalactiae.  continue ampicillin and switch over to amoxicillin orally for 6 weeks upon discharge.   2. NSTEMI.  Continue aspirin.  Add low-dose Toprol-XL at night 3. Type 2 diabetes mellitus.  Patient has  refused insulin.  Sugars are good.  Just to follow with diet control only. 4. Acute encephalopathy which is metabolic secondary to sepsis.  This has improved. 5. Weakness.   on physical therapy reevaluation they still recommend rehab at this time. 6. Seizure.  No recurrence.  MRI of the brain negative.  EEG showed no seizure. 7. Anemia patient on iron.   8. Thrombocytopenia probably secondary to sepsis.  Continue to monitor. 9. Hypokalemia and hypomagnesemia replace orally  Code Status:     Code Status Orders  (From admission, onward)        Start     Ordered   05/23/17 1127  Do not attempt resuscitation (DNR)  Continuous    Question Answer Comment  In the event of cardiac or respiratory ARREST Do not call a "code blue"   In the event of cardiac or respiratory ARREST Do not perform Intubation, CPR, defibrillation or ACLS   In the event of cardiac or respiratory ARREST Use medication by any route, position, wound care, and other measures to relive pain and suffering. May use oxygen, suction and manual treatment of airway obstruction as needed for comfort.   Comments RN may pronounce      05/23/17 1126    Code Status History    Date Active Date Inactive Code Status Order ID Comments User Context   03/15/2017 0256 03/16/2017 1743 DNR [id number]  Lance Coon, MD Inpatient   10/19/2015 1122 10/20/2015 1751 DNR [id number]  Nicholes Mango, MD Inpatient   10/19/2015 1014 10/19/2015 1121 Full Code [id number]  Harvie Bridge, DO Inpatient   03/03/2015 0127 03/05/2015 1702 Full Code [id number]  Lance Coon, MD Inpatient     Family Communication: Spoke with daughter at the bedside Disposition Plan: In order to go home with home health she needs to walk better.  Right now they are recommending rehab.   Consultants:  Cardiology  Infectious disease  Antibiotics:  Ampicillin  Time spent: 34 minutes with translator.   Tinley Rought [employer]

## 2017-05-27 NOTE — Progress Notes (Signed)
Telephone call to [facility] in [place] ([id number]) regarding follow up Physical Therapy for pt; initial eval performed 05/25/17 recommended SNF with rolling walker; Dr Leslye Peer feels pt has progressed and may only need home health; PT will add the pt to his list

## 2017-05-27 NOTE — Progress Notes (Signed)
Physical Therapy Treatment Patient Details Name: Ariana Miller MRN: [id number] DOB: 05/26/1923 Today's Date: 05/27/2017    History of Present Illness 84 y.o.femalewith a known history of mild dementia chronic anemia, bladder cancer, CAD/CABG, diabetes and chronic diastolic heart failure with preserved ejection fraction who presents to the emergency room due to a fall and generalized weakness.    PT Comments    Pt presents with deficits in strength, transfers, mobility, gait, balance, and activity tolerance.  Pt continues to require extensive assistance with all bed mobility tasks.  Pt was able to perform sit to/from stand transfers this session but required extensive +2 assistance.  Once in standing pt was able to maintain standing position with close CGA but was unable to advance either LE during attempts at ambulation.  Overall pt remains very weak functionally and is at a high risk for falls.  Pt will benefit from PT services in a SNF setting upon discharge to safely address above deficits for decreased caregiver assistance and eventual return to PLOF.     Follow Up Recommendations  SNF     Equipment Recommendations  Rolling walker with 5" wheels;Other (comment)(TBD at next venue of care upon discharge to a SNF)    Recommendations for Other Services       Precautions / Restrictions Precautions Precautions: Fall Restrictions Weight Bearing Restrictions: No    Mobility  Bed Mobility Overal bed mobility: Needs Assistance Bed Mobility: Supine to Sit;Sit to Supine;Rolling Rolling: Max assist   Supine to sit: Max assist Sit to supine: Max assist   General bed mobility comments: Pt showed effort with bed mobility tasks but continues to required extensive assistance  Transfers Overall transfer level: Needs assistance Equipment used: Rolling walker (2 wheeled) Transfers: Sit to/from Stand Sit to Stand: +2 physical assistance;Max assist         General transfer comment:  Pt required extensive +2 assistance to stand but once in standing was +2 CGA for safety  Ambulation/Gait             General Gait Details: Pt attempted to take a step while standing at the EOB but was unable   Stairs             Wheelchair Mobility    Modified Rankin (Stroke Patients Only)       Balance Overall balance assessment: Needs assistance Sitting-balance support: Bilateral upper extremity supported Sitting balance-Leahy Scale: Fair Sitting balance - Comments: Pt required extensive assistance to get up to a sitting position but was able to maintain static sitting at the EOB with close SBA/CGA   Standing balance support: Bilateral upper extremity supported Standing balance-Leahy Scale: Poor Standing balance comment: Heavy reliance on BUE on the RW for stability during static standing                            Cognition Arousal/Alertness: Awake/alert Behavior During Therapy: Flat affect Overall Cognitive Status: Difficult to assess                                        Exercises Other Exercises Other Exercises: Extensive bed mobility training with rolling and supine to/from sit Other Exercises: Static sitting/balance training unsupported at the EOB Other Exercises: Sit to/from stand transfer training from an elevated EOB    General Comments        Pertinent Vitals/Pain  Pain Assessment: No/denies pain    Home Living                      Prior Function            PT Goals (current goals can now be found in the care plan section) Progress towards PT goals: Progressing toward goals    Frequency    Min 2X/week      PT Plan Current plan remains appropriate    Co-evaluation              AM-PAC PT "6 Clicks" Daily Activity  Outcome Measure                   End of Session Equipment Utilized During Treatment: Gait belt Activity Tolerance: Patient tolerated treatment well Patient left:  in bed;with call bell/phone within reach;with family/visitor present;with nursing/sitter in room(CNA assisting pt with hygiene after session and to set bed alarm once completed) Nurse Communication: Mobility status PT Visit Diagnosis: Muscle weakness (generalized) (M62.81);Difficulty in walking, not elsewhere classified (R26.2)     Time: 7169-6789 PT Time Calculation (min) (ACUTE ONLY): 25 min  Charges:  $Therapeutic Activity: 23-37 mins                    G Codes:       DRoyetta Asal PT, DPT 05/27/17, 1:14 PM

## 2017-05-27 NOTE — Progress Notes (Signed)
Pharmacy Electrolyte Monitoring Consult:  Pharmacy consulted to assist in monitoring and replacing electrolytes in this 84 y.o. female admitted on 05/23/2017 with witnessed seizure and being treated for sepsis/possible meningitis.   Labs:  Sodium (mmol/L)  Date Value  05/27/2017 137  01/05/2012 135 (L)   Potassium (mmol/L)  Date Value  05/27/2017 3.5  01/05/2012 3.8   Magnesium (mg/dL)  Date Value  05/27/2017 1.8   Phosphorus (mg/dL)  Date Value  05/26/2017 2.5   Calcium (mg/dL)  Date Value  05/27/2017 7.3 (L)   Calcium, Total (mg/dL)  Date Value  01/05/2012 7.8 (L)   Albumin (g/dL)  Date Value  05/24/2017 2.7 (L)  01/05/2012 3.3 (L)    Assessment/Plan: Goal potassium ~ 4 and goal magnesium ~ 2 in setting seizure.  Ordered KCl 90meq PO BID and Mag 2g IV x 1 dose.   Recheck all electrolytes with AM labs.   Olivia Canter [facility] Clinical Pharmacist 05/27/2017 8:17 AM

## 2017-05-28 LAB — GLUCOSE, CAPILLARY
GLUCOSE-CAPILLARY: 147 mg/dL — AB (ref 65–99)
GLUCOSE-CAPILLARY: 183 mg/dL — AB (ref 65–99)
GLUCOSE-CAPILLARY: 195 mg/dL — AB (ref 65–99)
GLUCOSE-CAPILLARY: 207 mg/dL — AB (ref 65–99)
Glucose-Capillary: 163 mg/dL — ABNORMAL HIGH (ref 65–99)

## 2017-05-28 LAB — CBC
HCT: 25.6 % — ABNORMAL LOW (ref 35.0–47.0)
HEMOGLOBIN: 8.5 g/dL — AB (ref 12.0–16.0)
MCH: 31.3 pg (ref 26.0–34.0)
MCHC: 33.4 g/dL (ref 32.0–36.0)
MCV: 93.6 fL (ref 80.0–100.0)
Platelets: 90 10*3/uL — ABNORMAL LOW (ref 150–440)
RBC: 2.73 MIL/uL — AB (ref 3.80–5.20)
RDW: 17.7 % — ABNORMAL HIGH (ref 11.5–14.5)
WBC: 12.6 10*3/uL — ABNORMAL HIGH (ref 3.6–11.0)

## 2017-05-28 LAB — POTASSIUM: Potassium: 4.7 mmol/L (ref 3.5–5.1)

## 2017-05-28 LAB — MAGNESIUM: Magnesium: 2.1 mg/dL (ref 1.7–2.4)

## 2017-05-28 MED ORDER — POTASSIUM CHLORIDE CRYS ER 20 MEQ PO TBCR
20.0000 meq | EXTENDED_RELEASE_TABLET | Freq: Two times a day (BID) | ORAL | Status: DC
  Administered 2017-05-28 (×2): 20 meq via ORAL
  Filled 2017-05-28 (×2): qty 1

## 2017-05-28 MED ORDER — OLANZAPINE 5 MG PO TBDP
5.0000 mg | ORAL_TABLET | Freq: Once | ORAL | Status: AC | PRN
  Administered 2017-05-28: 04:00:00 5 mg via ORAL
  Filled 2017-05-28: qty 1

## 2017-05-28 MED ORDER — MELATONIN 5 MG PO TABS
5.0000 mg | ORAL_TABLET | Freq: Every day | ORAL | Status: DC
  Administered 2017-05-28: 5 mg via ORAL
  Filled 2017-05-28 (×2): qty 1

## 2017-05-28 MED ORDER — AMLODIPINE BESYLATE 5 MG PO TABS
5.0000 mg | ORAL_TABLET | Freq: Every day | ORAL | Status: DC
  Administered 2017-05-28 – 2017-05-29 (×2): 5 mg via ORAL
  Filled 2017-05-28 (×2): qty 1

## 2017-05-28 MED ORDER — RISPERIDONE 0.5 MG PO TABS
0.5000 mg | ORAL_TABLET | Freq: Every day | ORAL | Status: DC
  Administered 2017-05-28: 23:00:00 0.5 mg via ORAL
  Filled 2017-05-28 (×3): qty 1

## 2017-05-28 NOTE — Plan of Care (Signed)

## 2017-05-28 NOTE — Progress Notes (Signed)
Patient ID: ANALIZ TVEDT, female   DOB: 01-Aug-1923, 84 y.o.   MRN: [id number]   [facility] PROGRESS NOTE  Ariana Miller NLG:[id number] DOB: 18-May-1923 DOA: 05/23/2017 PCP: Rusty Aus, MD  HPI/Subjective: Patient did not sleep last night.  Given trazodone and she was up.  She was also given Zyprexa last night.  Patient feels okay.  Was trying to pull out IVs last night.  Objective: Vitals:   05/28/17 0545 05/28/17 1216  BP: (!) 129/48 (!) 138/52  Pulse: 80 82  Resp: 16 17  Temp: 97.7 F (36.5 C) 97.9 F (36.6 C)  SpO2: 99% 98%    Filed Weights   05/26/17 0500 05/27/17 0500 05/28/17 0500  Weight: 71.7 kg (158 lb) 84.8 kg (187 lb) 86.2 kg (190 lb)    ROS: Review of Systems  Unable to perform ROS: Acuity of condition  Respiratory: Negative for shortness of breath.   Cardiovascular: Negative for chest pain.  Gastrointestinal: Negative for abdominal pain.  Neurological: Negative for headaches.   Exam: Physical Exam  HENT:  Nose: No mucosal edema.  Mouth/Throat: No oropharyngeal exudate or posterior oropharyngeal edema.  Eyes: Pupils are equal, round, and reactive to light. Conjunctivae, EOM and lids are normal.  Neck: No JVD present. Carotid bruit is not present. No edema present. No thyroid mass and no thyromegaly present.  Cardiovascular: S1 normal and S2 normal. Exam reveals no gallop.  No murmur heard. Pulses:      Dorsalis pedis pulses are 2+ on the right side, and 2+ on the left side.  Respiratory: No respiratory distress. She has decreased breath sounds in the right lower field and the left lower field. She has no wheezes. She has no rhonchi. She has no rales.  GI: Soft. Bowel sounds are normal. There is no tenderness.  Musculoskeletal:       Right ankle: She exhibits no swelling.       Left ankle: She exhibits no swelling.  Lymphadenopathy:    She has no cervical adenopathy.  Neurological: She is alert. No cranial nerve deficit.  Skin: Skin is warm.  No rash noted. Nails show no clubbing.  Psychiatric: She has a normal mood and affect.      Data Reviewed: Basic Metabolic Panel: Recent Labs  Lab 05/23/17 0713  05/23/17 1217 05/24/17 0221 05/24/17 0502 05/25/17 0814 05/26/17 0457 05/27/17 0450 05/28/17 0446  NA 131*  --   --  134*  --  132* 135 137  --   K 3.0*  --   --  3.1* 3.3* 3.4* 3.8 3.5 4.7  CL 98*  --   --  102  --  102 108 108  --   CO2 21*  --   --  23  --  23 24 25   --   GLUCOSE 201*  --   --  84  --  101* 113* 128*  --   BUN 16  --   --  17  --  17 15 19   --   CREATININE 0.85  --  0.94 0.83  --  0.84 0.78 0.73  --   CALCIUM 8.3*  --   --  7.4*  --  6.9* 7.1* 7.3*  --   MG  --    < > 1.3* 2.5*  --  1.9 2.0 1.8 2.1  PHOS  --   --  1.6*  --  3.4 2.3* 2.5  --   --    < > =  values in this interval not displayed.   Liver Function Tests: Recent Labs  Lab 05/23/17 0713 05/24/17 0221  AST 46* 51*  ALT 20 21  ALKPHOS 127* 77  BILITOT 2.1* 1.0  PROT 8.4* 6.8  ALBUMIN 3.5 2.7*   CBC: Recent Labs  Lab 05/23/17 0713 05/23/17 1217 05/24/17 0221 05/25/17 0417 05/26/17 0457 05/28/17 0446  WBC 15.5* 23.1* 25.3* 15.4* 11.1* 12.6*  NEUTROABS 13.9*  --   --   --   --   --   HGB 11.8* 10.3* 9.5* 8.2* 8.1* 8.5*  HCT 35.7 31.6* 29.0* 24.3* 24.5* 25.6*  MCV 93.6 93.9 94.0 92.5 92.9 93.6  PLT 81* 62* 61* 56* 62* 90*   Cardiac Enzymes: Recent Labs  Lab 05/23/17 0713 05/23/17 1217 05/23/17 1616 05/23/17 2231 05/24/17 1011  CKTOTAL 42  --   --   --   --   TROPONINI 0.06* 1.03* 2.00* 2.82* 2.93*   BNP (last 3 results) Recent Labs    03/14/17 2225  BNP 608.0*    CBG: Recent Labs  Lab 05/27/17 1631 05/27/17 2133 05/28/17 0742 05/28/17 1132 05/28/17 1359  GLUCAP 186* 175* 147* 183* 163*    Recent Results (from the past 240 hour(s))  Culture, blood (Routine x 2)     Status: Abnormal   Collection Time: 05/23/17  7:13 AM  Result Value Ref Range Status   Specimen Description   Final    BLOOD LEFT  AC Performed at [facility], [address] [place], [place] [address]    Special Requests   Final    BOTTLES DRAWN AEROBIC AND ANAEROBIC Blood Culture adequate volume Performed at [facility], [address] [place], [place] [address]    Culture  Setup Time   Final    GRAM POSITIVE COCCI IN BOTH AEROBIC AND ANAEROBIC BOTTLES CRITICAL RESULT CALLED TO, READ BACK BY AND VERIFIED WITH: JASON ROBBINS @1546  05/23/17 AKT    Culture (A)  Final    GROUP B STREP(S.AGALACTIAE)ISOLATED SUSCEPTIBILITIES PERFORMED ON PREVIOUS CULTURE WITHIN THE LAST 5 DAYS. Performed at [facility], [place] [address], [place], [place] [address]    Report Status 05/26/2017 FINAL  Final  Blood Culture ID Panel (Reflexed)     Status: Abnormal   Collection Time: 05/23/17  7:13 AM  Result Value Ref Range Status   Enterococcus species NOT DETECTED NOT DETECTED Final   Listeria monocytogenes NOT DETECTED NOT DETECTED Final   Staphylococcus species NOT DETECTED NOT DETECTED Final   Staphylococcus aureus NOT DETECTED NOT DETECTED Final   Streptococcus species DETECTED (A) NOT DETECTED Final    Comment: CRITICAL RESULT CALLED TO, READ BACK BY AND VERIFIED WITH: JASON ROBBINS @1546  05/23/17 AKT    Streptococcus agalactiae DETECTED (A) NOT DETECTED Final    Comment: CRITICAL RESULT CALLED TO, READ BACK BY AND VERIFIED WITH: JASON ROBBINS @1546  05/23/17 AKT    Streptococcus pneumoniae NOT DETECTED NOT DETECTED Final   Streptococcus pyogenes NOT DETECTED NOT DETECTED Final   Acinetobacter baumannii NOT DETECTED NOT DETECTED Final   Enterobacteriaceae species NOT DETECTED NOT DETECTED Final   Enterobacter cloacae complex NOT DETECTED NOT DETECTED Final   Escherichia coli NOT DETECTED NOT DETECTED Final   Klebsiella oxytoca NOT DETECTED NOT DETECTED Final   Klebsiella pneumoniae NOT DETECTED NOT DETECTED Final   Proteus species NOT DETECTED NOT DETECTED Final   Serratia marcescens NOT DETECTED  NOT DETECTED Final   Haemophilus influenzae NOT DETECTED NOT DETECTED Final  Neisseria meningitidis NOT DETECTED NOT DETECTED Final   Pseudomonas aeruginosa NOT DETECTED NOT DETECTED Final   Candida albicans NOT DETECTED NOT DETECTED Final   Candida glabrata NOT DETECTED NOT DETECTED Final   Candida krusei NOT DETECTED NOT DETECTED Final   Candida parapsilosis NOT DETECTED NOT DETECTED Final   Candida tropicalis NOT DETECTED NOT DETECTED Final    Comment: Performed at [facility], [address] [place], [place] [address]  Culture, blood (Routine x 2)     Status: Abnormal   Collection Time: 05/23/17  7:45 AM  Result Value Ref Range Status   Specimen Description   Final    BLOOD RIGHT HAND Performed at [facility], [address], [place], [place] [address]    Special Requests   Final    BOTTLES DRAWN AEROBIC AND ANAEROBIC Blood Culture results may not be optimal due to an excessive volume of blood received in culture bottles Performed at [facility], [address] [place], [place] [address]    Culture  Setup Time   Final    GRAM POSITIVE COCCI IN BOTH AEROBIC AND ANAEROBIC BOTTLES CRITICAL RESULT CALLED TO, READ BACK BY AND VERIFIED WITH: JASON ROBBINS @1546  05/23/17 AKT Performed at [facility], [place] [address], [place], [place] [address]    Culture GROUP B STREP(S.AGALACTIAE)ISOLATED (A)  Final   Report Status 05/26/2017 FINAL  Final   Organism ID, Bacteria GROUP B STREP(S.AGALACTIAE)ISOLATED  Final      Susceptibility   Group b strep(s.agalactiae)isolated - MIC*    CLINDAMYCIN >=1 RESISTANT Resistant     AMPICILLIN <=0.25 SENSITIVE Sensitive     ERYTHROMYCIN >=8 RESISTANT Resistant     VANCOMYCIN 0.5 SENSITIVE Sensitive     CEFTRIAXONE <=0.12 SENSITIVE Sensitive     LEVOFLOXACIN 0.5 SENSITIVE Sensitive     * GROUP B STREP(S.AGALACTIAE)ISOLATED  CSF culture     Status: None   Collection Time: 05/23/17  9:57 AM  Result Value Ref  Range Status   Specimen Description   Final    CSF Performed at [facility], [address], [place], [place] [address]    Special Requests   Final    CSF Performed at [facility], [address] [place], [place] [address]    Gram Stain   Final    NO ORGANISMS SEEN RARE WBC RARE RBC Performed at [facility], [address], [place], [place] [address]    Culture   Final    NO GROWTH 3 DAYS Performed at [facility], [place] [address] [place], [place] [address]    Report Status 05/27/2017 FINAL  Final     Studies: No results found.  Scheduled Meds: . aspirin  81 mg Oral Daily  . docusate sodium  100 mg Oral BID  . enoxaparin (LOVENOX) injection  40 mg Subcutaneous Q24H  . ferrous sulfate  325 mg Oral Q breakfast  . mouth rinse  15 mL Mouth Rinse BID  . Melatonin  5 mg Oral QHS  . metoprolol succinate  12.5 mg Oral QHS  . pneumococcal 23 valent vaccine  0.5 mL Intramuscular Tomorrow-1000  . potassium chloride  20 mEq Oral BID  . risperiDONE  0.5 mg Oral QHS   Continuous Infusions: . ampicillin (OMNIPEN) IV Stopped (05/28/17 1309)    Assessment/Plan:  1. Sepsis with group B Streptococcus agalactiae.  continue ampicillin and switch over to amoxicillin orally for 6 weeks upon discharge. 2. Insomnia.  Continue  Risperdal at night.  Add melatonin. 3. NSTEMI.  Continue aspirin, Toprol 4. Type 2 diabetes mellitus.  Patient has refused insulin.  Sugars are good.  Just to follow with diet control only. 5. Acute encephalopathy which is metabolic secondary to sepsis.  This has improved but the patient has not slept last night 6. Weakness.   on physical therapy reevaluation they still recommend rehab at this time. 7. Seizure.  No recurrence.  MRI of the brain negative.  EEG showed no seizure. 8. Anemia patient on iron.   9. Thrombocytopenia probably secondary to sepsis.  Continue to monitor. 10. Hypokalemia and hypomagnesemia replaced  Code  Status:     Code Status Orders  (From admission, onward)        Start     Ordered   05/23/17 1127  Do not attempt resuscitation (DNR)  Continuous    Question Answer Comment  In the event of cardiac or respiratory ARREST Do not call a "code blue"   In the event of cardiac or respiratory ARREST Do not perform Intubation, CPR, defibrillation or ACLS   In the event of cardiac or respiratory ARREST Use medication by any route, position, wound care, and other measures to relive pain and suffering. May use oxygen, suction and manual treatment of airway obstruction as needed for comfort.   Comments RN may pronounce      05/23/17 1126    Code Status History    Date Active Date Inactive Code Status Order ID Comments User Context   03/15/2017 0256 03/16/2017 1743 DNR [id number]  Lance Coon, MD Inpatient   10/19/2015 1122 10/20/2015 1751 DNR [id number]  Nicholes Mango, MD Inpatient   10/19/2015 1014 10/19/2015 1121 Full Code [id number]  Harvie Bridge, DO Inpatient   03/03/2015 0127 03/05/2015 1702 Full Code [id number]  Lance Coon, MD Inpatient     Family Communication: Spoke with daughter at the bedside Disposition Plan: Unable to do much with physical therapy  Consultants:  Cardiology  Infectious disease  Antibiotics:  Ampicillin  Time spent: 28 minutes with translator.   Darrion Wyszynski [employer]

## 2017-05-28 NOTE — Progress Notes (Signed)
Pharmacy Electrolyte Monitoring Consult:  Pharmacy consulted to assist in monitoring and replacing electrolytes in this 84 y.o. female admitted on 05/23/2017 with witnessed seizure and being treated for sepsis/possible meningitis.   Labs:  Sodium (mmol/L)  Date Value  05/27/2017 137  01/05/2012 135 (L)   Potassium (mmol/L)  Date Value  05/28/2017 4.7  01/05/2012 3.8   Magnesium (mg/dL)  Date Value  05/28/2017 2.1   Phosphorus (mg/dL)  Date Value  05/26/2017 2.5   Calcium (mg/dL)  Date Value  05/27/2017 7.3 (L)   Calcium, Total (mg/dL)  Date Value  01/05/2012 7.8 (L)   Albumin (g/dL)  Date Value  05/24/2017 2.7 (L)  01/05/2012 3.3 (L)    Assessment/Plan: Goal potassium ~ 4 and goal magnesium ~ 2 in setting seizure.  Ordered KCl 33meq PO BID.  Recheck electrolytes with AM labs.   Olivia Canter, [facility] Clinical Pharmacist 05/28/2017 8:30 AM

## 2017-05-28 NOTE — Plan of Care (Signed)
  Problem: Clinical Measurements: Goal: Cardiovascular complication will be avoided Outcome: Not Progressing  Elevated BP this shift; pt started on PO Norvasc this shift

## 2017-05-29 LAB — BASIC METABOLIC PANEL
Anion gap: 4 — ABNORMAL LOW (ref 5–15)
BUN: 20 mg/dL (ref 6–20)
CO2: 26 mmol/L (ref 22–32)
Calcium: 7.7 mg/dL — ABNORMAL LOW (ref 8.9–10.3)
Chloride: 108 mmol/L (ref 101–111)
Creatinine, Ser: 0.9 mg/dL (ref 0.44–1.00)
GFR calc Af Amer: >60 mL/min (ref >60)
GFR, EST NON AFRICAN AMERICAN: 54 mL/min — AB (ref >60)
GLUCOSE: 150 mg/dL — AB (ref 65–99)
POTASSIUM: 5.1 mmol/L (ref 3.5–5.1)
Sodium: 138 mmol/L (ref 135–145)

## 2017-05-29 LAB — MAGNESIUM: Magnesium: 2 mg/dL (ref 1.7–2.4)

## 2017-05-29 LAB — GLUCOSE, CAPILLARY: Glucose-Capillary: 138 mg/dL — ABNORMAL HIGH (ref 65–99)

## 2017-05-29 MED ORDER — METOPROLOL SUCCINATE ER 25 MG PO TB24: 12.5000 mg | ORAL_TABLET | Freq: Every day | ORAL | 0 refills | Status: DC

## 2017-05-29 MED ORDER — IBUPROFEN 400 MG PO TABS: 400.0000 mg | ORAL_TABLET | Freq: Four times a day (QID) | ORAL | 0 refills | Status: AC | PRN

## 2017-05-29 MED ORDER — AMOXICILLIN 500 MG PO CAPS
500.0000 mg | ORAL_CAPSULE | Freq: Three times a day (TID) | ORAL | Status: DC
Start: 2017-05-29 — End: 2017-05-29
  Filled 2017-05-29 (×2): qty 1

## 2017-05-29 MED ORDER — MELATONIN 5 MG PO TABS: 5.0000 mg | ORAL_TABLET | Freq: Every day | ORAL | 0 refills | Status: AC

## 2017-05-29 MED ORDER — RISPERIDONE 0.5 MG PO TABS: 0.5000 mg | ORAL_TABLET | Freq: Every evening | ORAL | 0 refills | Status: AC | PRN

## 2017-05-29 MED ORDER — DOCUSATE SODIUM 100 MG PO CAPS: 100.0000 mg | ORAL_CAPSULE | Freq: Two times a day (BID) | ORAL | 0 refills | Status: AC

## 2017-05-29 MED ORDER — AMLODIPINE BESYLATE 5 MG PO TABS: 5.0000 mg | ORAL_TABLET | Freq: Every day | ORAL | 0 refills | Status: DC

## 2017-05-29 MED ORDER — AMOXICILLIN 500 MG PO CAPS: 500.0000 mg | ORAL_CAPSULE | Freq: Three times a day (TID) | ORAL | 0 refills | Status: DC

## 2017-05-29 MED ORDER — AMOXICILLIN 500 MG PO CAPS: 500.0000 mg | ORAL_CAPSULE | Freq: Three times a day (TID) | ORAL | 0 refills | Status: AC

## 2017-05-29 MED ORDER — ACETAMINOPHEN 325 MG PO TABS: 650.0000 mg | ORAL_TABLET | Freq: Four times a day (QID) | ORAL | Status: AC | PRN

## 2017-05-29 NOTE — Progress Notes (Addendum)
Pharmacy Electrolyte Monitoring Consult:  Pharmacy consulted to assist in monitoring and replacing electrolytes in this 84 y.o. female admitted on 05/23/2017 with witnessed seizure and being treated for sepsis/possible meningitis.   Labs:  Sodium (mmol/L)  Date Value  05/29/2017 138  01/05/2012 135 (L)   Potassium (mmol/L)  Date Value  05/29/2017 5.1  01/05/2012 3.8   Magnesium (mg/dL)  Date Value  05/29/2017 2.0   Phosphorus (mg/dL)  Date Value  05/26/2017 2.5   Calcium (mg/dL)  Date Value  05/29/2017 7.7 (L)   Calcium, Total (mg/dL)  Date Value  01/05/2012 7.8 (L)   Albumin (g/dL)  Date Value  05/24/2017 2.7 (L)  01/05/2012 3.3 (L)    Assessment/Plan: Goal potassium ~ 4 and goal magnesium ~ 2 in setting seizure.  K now 5.1 - KCl order d/c'd by MD  Will sign off.  Rocky Morel, [facility] Clinical Pharmacist 05/29/2017 8:57 AM

## 2017-05-29 NOTE — Clinical Social Work Note (Signed)
Patient to discharge to [facility] today. CSW has notified [facility]us [facility] at [facility] and sent discharge information. Nursing to notify patient's daughter. Shela Leff MSW,LCSW [phone]

## 2017-05-29 NOTE — Plan of Care (Signed)

## 2017-05-29 NOTE — Discharge Summary (Addendum)
[facility] [place] NAME: Ariana Miller    MR#:  [id number]  DATE OF BIRTH:  07/12/1923  DATE OF ADMISSION:  05/23/2017 ADMITTING PHYSICIAN: Bettey Costa, MD  DATE OF DISCHARGE: 05/29/2017  PRIMARY CARE PHYSICIAN: Rusty Aus, MD    ADMISSION DIAGNOSIS:  Seizure ([place]) [R56.9] Fall, initial encounter [W19.XXXA] Altered mental status, unspecified altered mental status type [R41.82]  DISCHARGE DIAGNOSIS:  Active Problems:   Sepsis ([place])   SECONDARY DIAGNOSIS:   Past Medical History:  Diagnosis Date  . Anemia   . Arthritis   . Asthma   . Bladder cancer ([place])   . CAD (coronary artery disease)    a. 2-V CABG 03/2007 (LIMA-LAD, SVG-D1)  . Diabetes mellitus with complication ([place])   . Diastolic heart failure ([place])   . History of gastric ulcer   . HTN (hypertension)   . Pulmonary fibrosis ([place])   . Severe aortic stenosis    a. s/p Carpentier-Edwards AVR 03/2007; b. echo 2009 (pre-AVR): nl LVEF, mild LVH, severe AS, mild MS, trivial AR/MR/TR/PR, nl RVSF    HOSPITAL COURSE:    1. Sepsis with group B Streptococcus agalactiae.  The patient was on aggressive antibiotics and tapered to ampicillin.  As per ID can switch to oral amoxicillin orally for 6 weeks upon discharge.  Treat through July 2nd. 2. Insomnia.  Continue Risperdal at night as needed while at facility, once she goes home can stop this medication.  On melatonin. 3. NSTEMI.  Continue aspirin, Toprol.  I think the NSTEMI was secondary to sepsis.  Statin not prescribed secondary to severe weakness. 4. Type 2 diabetes mellitus.  Patient family has refused insulin.  Sugars are good.  Just to follow with diet control only.  Can check fingersticks qac and qhs. 5. Acute encephalopathy which is metabolic secondary to sepsis.  This has improved from admission.  6. Weakness.   on physical therapy reevaluation they still recommend rehab at this time. 7. Seizure.  No recurrence.  MRI of the  brain negative.  EEG showed no seizure. 8. Anemia patient on iron.   9. Thrombocytopenia probably secondary to sepsis.  Continue to monitor.  Starting to recover 10. Hypokalemia and hypomagnesemia replaced during hospital course    DISCHARGE CONDITIONS:   Fair  CONSULTS OBTAINED:  Treatment Team:  Alexis Goodell, MD Leonel Ramsay, MD  DRUG ALLERGIES:  No Known Allergies  DISCHARGE MEDICATIONS:   Allergies as of 05/29/2017   No Known Allergies     Medication List    STOP taking these medications   benzonatate 100 MG capsule Commonly known as:  TESSALON PERLES   glimepiride 1 MG tablet Commonly known as:  AMARYL   guaiFENesin-dextromethorphan 100-10 MG/5ML syrup Commonly known as:  ROBITUSSIN DM   lisinopril 10 MG tablet Commonly known as:  PRINIVIL,ZESTRIL     TAKE these medications   acetaminophen 325 MG tablet Commonly known as:  TYLENOL Take 2 tablets (650 mg total) by mouth every 6 (six) hours as needed for mild pain (or Fever >/= 101).   amLODipine 5 MG tablet Commonly known as:  NORVASC Take 1 tablet (5 mg total) by mouth daily.   amoxicillin 500 MG capsule Commonly known as:  AMOXIL Take 1 capsule (500 mg total) by mouth every 8 (eight) hours. Treat through July 2nd   aspirin EC 81 MG tablet Take 81 mg by mouth daily.   docusate sodium 100 MG capsule Commonly known as:  COLACE Take 1 capsule (100 mg total) by mouth 2 (two) times daily.   ferrous sulfate 325 (65 FE) MG tablet Take 325 mg by mouth daily with breakfast.   ibuprofen 400 MG tablet Commonly known as:  ADVIL,MOTRIN Take 1 tablet (400 mg total) by mouth every 6 (six) hours as needed for fever, headache, moderate pain or cramping.   Melatonin 5 MG Tabs Take 1 tablet (5 mg total) by mouth at bedtime.   metoprolol succinate 25 MG 24 hr tablet Commonly known as:  TOPROL-XL Take 0.5 tablets (12.5 mg total) by mouth at bedtime.   risperiDONE 0.5 MG tablet Commonly known as:   RISPERDAL Take 1 tablet (0.5 mg total) by mouth at bedtime as needed (insomnia).        DISCHARGE INSTRUCTIONS:   F/u doctor at rehab one day  If you experience worsening of your admission symptoms, develop shortness of breath, life threatening emergency, suicidal or homicidal thoughts you must seek medical attention immediately by calling 911 or calling your MD immediately  if symptoms less severe.  You Must read complete instructions/literature along with all the possible adverse reactions/side effects for all the Medicines you take and that have been prescribed to you. Take any new Medicines after you have completely understood and accept all the possible adverse reactions/side effects.   Please note  You were cared for by a hospitalist during your hospital stay. If you have any questions about your discharge medications or the care you received while you were in the hospital after you are discharged, you can call the unit and asked to speak with the hospitalist on call if the hospitalist that took care of you is not available. Once you are discharged, your primary care physician will handle any further medical issues. Please note that NO REFILLS for any discharge medications will be authorized once you are discharged, as it is imperative that you return to your primary care physician (or establish a relationship with a primary care physician if you do not have one) for your aftercare needs so that they can reassess your need for medications and monitor your lab values.    Today   CHIEF COMPLAINT:   Chief Complaint  Patient presents with  . Fall    HISTORY OF PRESENT ILLNESS:  Ariana Miller  is a 84 y.o. female with a known history of presented after fall, found to have sepsis   VITAL SIGNS:  Blood pressure (!) 149/46, pulse 75, temperature (!) 97.3 F (36.3 C), temperature source Axillary, resp. rate 18, height 5\' 2"  (1.575 m), weight 86.2 kg (190 lb), SpO2 100  %.   PHYSICAL EXAMINATION:  GENERAL:  84 y.o.-year-old patient lying in the bed with no acute distress.  EYES: Pupils equal, round, reactive to light and accommodation. No scleral icterus. HEENT: Head atraumatic, normocephalic. Oropharynx and nasopharynx clear.  NECK:  Supple, no jugular venous distention. No thyroid enlargement, no tenderness.  LUNGS: decreased breath sounds bilaterally, no wheezing, rales,rhonchi or crepitation. No use of accessory muscles of respiration.  CARDIOVASCULAR: S1, S2 normal. No murmurs, rubs, or gallops.  ABDOMEN: Soft, non-tender, non-distended. Bowel sounds present. No organomegaly or mass.  EXTREMITIES: No pedal edema, cyanosis, or clubbing.  NEUROLOGIC: Cranial nerves II through XII are intact. Muscle strength 5/5 in all extremities. Sensation intact. Gait not checked.  PSYCHIATRIC: The patient is alert and oriented x 3.  SKIN: No obvious rash, lesion, or ulcer.  Bruising upper and lower extremities.  Large bruise right hip  area  DATA REVIEW:   CBC Recent Labs  Lab 05/28/17 0446  WBC 12.6*  HGB 8.5*  HCT 25.6*  PLT 90*    Chemistries  Recent Labs  Lab 05/24/17 0221  05/29/17 0432  NA 134*   < > 138  K 3.1*   < > 5.1  CL 102   < > 108  CO2 23   < > 26  GLUCOSE 84   < > 150*  BUN 17   < > 20  CREATININE 0.83   < > 0.90  CALCIUM 7.4*   < > 7.7*  MG 2.5*   < > 2.0  AST 51*  --   --   ALT 21  --   --   ALKPHOS 77  --   --   BILITOT 1.0  --   --    < > = values in this interval not displayed.    Cardiac Enzymes Recent Labs  Lab 05/24/17 1011  TROPONINI 2.93*    Microbiology Results  Results for orders placed or performed during the hospital encounter of 05/23/17  Culture, blood (Routine x 2)     Status: Abnormal   Collection Time: 05/23/17  7:13 AM  Result Value Ref Range Status   Specimen Description   Final    BLOOD LEFT [facility] Performed at [facility], [address] [place], [place] [address]    Special Requests    Final    BOTTLES DRAWN AEROBIC AND ANAEROBIC Blood Culture adequate volume Performed at [facility], [address] [place], [place] [address]    Culture  Setup Time   Final    GRAM POSITIVE COCCI IN BOTH AEROBIC AND ANAEROBIC BOTTLES CRITICAL RESULT CALLED TO, READ BACK BY AND VERIFIED WITH: JASON ROBBINS @1546  05/23/17 AKT    Culture (A)  Final    GROUP B STREP(S.AGALACTIAE)ISOLATED SUSCEPTIBILITIES PERFORMED ON PREVIOUS CULTURE WITHIN THE LAST 5 DAYS. Performed at [facility], [place] [address], [place], [place] [address]    Report Status 05/26/2017 FINAL  Final  Blood Culture ID Panel (Reflexed)     Status: Abnormal   Collection Time: 05/23/17  7:13 AM  Result Value Ref Range Status   Enterococcus species NOT DETECTED NOT DETECTED Final   Listeria monocytogenes NOT DETECTED NOT DETECTED Final   Staphylococcus species NOT DETECTED NOT DETECTED Final   Staphylococcus aureus NOT DETECTED NOT DETECTED Final   Streptococcus species DETECTED (A) NOT DETECTED Final    Comment: CRITICAL RESULT CALLED TO, READ BACK BY AND VERIFIED WITH: JASON ROBBINS @1546  05/23/17 AKT    Streptococcus agalactiae DETECTED (A) NOT DETECTED Final    Comment: CRITICAL RESULT CALLED TO, READ BACK BY AND VERIFIED WITH: JASON ROBBINS @1546  05/23/17 AKT    Streptococcus pneumoniae NOT DETECTED NOT DETECTED Final   Streptococcus pyogenes NOT DETECTED NOT DETECTED Final   Acinetobacter baumannii NOT DETECTED NOT DETECTED Final   Enterobacteriaceae species NOT DETECTED NOT DETECTED Final   Enterobacter cloacae complex NOT DETECTED NOT DETECTED Final   Escherichia coli NOT DETECTED NOT DETECTED Final   Klebsiella oxytoca NOT DETECTED NOT DETECTED Final   Klebsiella pneumoniae NOT DETECTED NOT DETECTED Final   Proteus species NOT DETECTED NOT DETECTED Final   Serratia marcescens NOT DETECTED NOT DETECTED Final   Haemophilus influenzae NOT DETECTED NOT DETECTED Final   Neisseria meningitidis NOT  DETECTED NOT DETECTED Final   Pseudomonas aeruginosa NOT DETECTED NOT DETECTED Final   Candida albicans NOT  DETECTED NOT DETECTED Final   Candida glabrata NOT DETECTED NOT DETECTED Final   Candida krusei NOT DETECTED NOT DETECTED Final   Candida parapsilosis NOT DETECTED NOT DETECTED Final   Candida tropicalis NOT DETECTED NOT DETECTED Final    Comment: Performed at [facility], [address] [place], [place] [address]  Culture, blood (Routine x 2)     Status: Abnormal   Collection Time: 05/23/17  7:45 AM  Result Value Ref Range Status   Specimen Description   Final    BLOOD RIGHT HAND Performed at [facility], [address] [place], [place] [address]    Special Requests   Final    BOTTLES DRAWN AEROBIC AND ANAEROBIC Blood Culture results may not be optimal due to an excessive volume of blood received in culture bottles Performed at [facility], [address] [place], [place] [address]    Culture  Setup Time   Final    GRAM POSITIVE COCCI IN BOTH AEROBIC AND ANAEROBIC BOTTLES CRITICAL RESULT CALLED TO, READ BACK BY AND VERIFIED WITH: JASON ROBBINS @1546  05/23/17 AKT Performed at [facility], [place] [address], [place], [place] [address]    Culture GROUP B STREP(S.AGALACTIAE)ISOLATED (A)  Final   Report Status 05/26/2017 FINAL  Final   Organism ID, Bacteria GROUP B STREP(S.AGALACTIAE)ISOLATED  Final      Susceptibility   Group b strep(s.agalactiae)isolated - MIC*    CLINDAMYCIN >=1 RESISTANT Resistant     AMPICILLIN <=0.25 SENSITIVE Sensitive     ERYTHROMYCIN >=8 RESISTANT Resistant     VANCOMYCIN 0.5 SENSITIVE Sensitive     CEFTRIAXONE <=0.12 SENSITIVE Sensitive     LEVOFLOXACIN 0.5 SENSITIVE Sensitive     * GROUP B STREP(S.AGALACTIAE)ISOLATED  CSF culture     Status: None   Collection Time: 05/23/17  9:57 AM  Result Value Ref Range Status   Specimen Description   Final    CSF Performed at [facility], [address], [place], [place] [address]    Special Requests   Final    CSF Performed at [facility], [address] [place], [place] [address]    Gram Stain   Final    NO ORGANISMS SEEN RARE WBC RARE RBC Performed at [facility], [address] [place], [place] [address]    Culture   Final    NO GROWTH 3 DAYS Performed at [facility], [place] [address] [place], [place] [address]    Report Status 05/27/2017 FINAL  Final       Management plans discussed with the patient, family and they are in agreement.  CODE STATUS:     Code Status Orders  (From admission, onward)        Start     Ordered   05/23/17 1127  Do not attempt resuscitation (DNR)  Continuous    Question Answer Comment  In the event of cardiac or respiratory ARREST Do not call a "code blue"   In the event of cardiac or respiratory ARREST Do not perform Intubation, CPR, defibrillation or ACLS   In the event of cardiac or respiratory ARREST Use medication by any route, position, wound care, and other measures to relive pain and suffering. May use oxygen, suction and manual treatment of airway obstruction as needed for comfort.   Comments RN may pronounce      05/23/17 1126    Code Status History    Date Active Date Inactive Code Status  Order ID Comments User Context   03/15/2017 0256 03/16/2017 1743 DNR [id number]  Lance Coon, MD Inpatient   10/19/2015 1122 10/20/2015 1751 DNR [id number]  Nicholes Mango, MD Inpatient   10/19/2015 1014 10/19/2015 1121 Full Code [id number]  Harvie Bridge, DO Inpatient   03/03/2015 0127 03/05/2015 1702 Full Code [id number]  Lance Coon, MD Inpatient      TOTAL TIME TAKING CARE OF THIS PATIENT: 35 minutes.    Loletha Grayer M.D on 05/29/2017 at 8:04 AM  Between 7am to 6pm - Pager - [phone]  After 6pm go to [email or web address] - password [email or web address]  [facility] Office  [phone]  CC: Primary care physician; Rusty Aus, MD

## 2017-05-29 NOTE — Progress Notes (Signed)
Patient discharged to [facility] per MD order. Report called to facility. EMS called for transportation.

## 2017-05-29 NOTE — Care Management Important Message (Signed)
Important Message  Patient Details  Name: Ariana Miller MRN: [id number] Date of Birth: 05/07/1923   Medicare Important Message Given:  Yes    Juliann Pulse A Fynn Vanblarcom 05/29/2017, 11:54 AM

## 2017-05-29 NOTE — Plan of Care (Signed)
  Problem: Education: Goal: Knowledge of General Education information will improve 05/29/2017 0319 by [facility], Dyer Klug M, RN Outcome: Progressing 05/29/2017 0239 by [facility]yl [facility], Alexzandra Bilton M, RN Outcome: Progressing   Problem: Health Behavior/Discharge Planning: Goal: Ability to manage health-related needs will improve 05/29/2017 0319 by [facility], Champ Keetch M, RN Outcome: Progressing 05/29/2017 0239 by Jeryl [facility], Rie Mcneil M, RN Outcome: Progressing   Problem: Clinical Measurements: Goal: Ability to maintain clinical measurements within normal limits will improve 05/29/2017 0319 by [facility], Shay Jhaveri M, RN Outcome: Progressing 05/29/2017 0239 by [facility]yl [facility], Joletta Manner M, RN Outcome: Progressing Goal: Will remain free from infection 05/29/2017 0319 by Bryce Kimble M, RN Outcome: Progressing 05/29/2017 0239 by Jeryl [facility], Marinus Eicher M, RN Outcome: Progressing Goal: Diagnostic test results will improve 05/29/2017 0319 by [facility], Biagio Snelson M, RN Outcome: Progressing 05/29/2017 0239 by Jeryl [facility], Briannah Lona M, RN Outcome: Progressing Goal: Respiratory complications will improve 05/29/2017 0319 by [facility], Kindal Ponti M, RN Outcome: Progressing 05/29/2017 0239 by Jeryl Columbia, Rafaelita Foister M, RN Outcome: Progressing Goal: Cardiovascular complication will be avoided 05/29/2017 0319 by Marlei Glomski M, RN Outcome: Progressing 05/29/2017 0239 by Jeryl Columbia, Maximilien Hayashi M, RN Outcome: Progressing   Problem: Activity: Goal: Risk for activity intolerance will decrease 05/29/2017 0319 by Normal Recinos M, RN Outcome: Progressing 05/29/2017 0239 by Jeryl [facility], Farris Geiman M, RN Outcome: Progressing   Problem: Nutrition: Goal: Adequate nutrition will be maintained 05/29/2017 0319 by Kayston Jodoin M, RN Outcome: Progressing 05/29/2017 0239 by Jeryl Columbia, Doc Mandala M, RN Outcome: Progressing   Problem: Coping: Goal: Level of anxiety will decrease 05/29/2017 0319 by Kolbie Clarkston M, RN Outcome: Progressing 05/29/2017 0239 by Jeryl [facility], Kentley Cedillo M,  RN Outcome: Progressing   Problem: Elimination: Goal: Will not experience complications related to bowel motility 05/29/2017 0319 by [facility], Naser Schuld M, RN Outcome: Progressing 05/29/2017 0239 by Jeryl Columbia, Zaron Zwiefelhofer M, RN Outcome: Progressing Goal: Will not experience complications related to urinary retention 05/29/2017 0319 by [facility], Latonyia Lopata M, RN Outcome: Progressing 05/29/2017 0239 by Jeryl Columbia, Phuc Kluttz M, RN Outcome: Progressing   Problem: Pain Managment: Goal: General experience of comfort will improve 05/29/2017 0319 by [facility], Geanette Buonocore M, RN Outcome: Progressing 05/29/2017 0239 by Jeryl [facility], Briton Sellman M, RN Outcome: Progressing   Problem: Safety: Goal: Ability to remain free from injury will improve 05/29/2017 0319 by [facility], Tahir Blank M, RN Outcome: Progressing 05/29/2017 0239 by Jeryl [facility], Chiante Peden M, RN Outcome: Progressing   Problem: Skin Integrity: Goal: Risk for impaired skin integrity will decrease 05/29/2017 0319 by Addilyne Backs M, RN Outcome: Progressing 05/29/2017 0239 by Jeryl Columbia, Zelina Jimerson M, RN Outcome: Progressing

## 2017-05-29 NOTE — Clinical Social Work Placement (Signed)
   CLINICAL SOCIAL WORK PLACEMENT  NOTE  Date:  05/29/2017  Patient Details  Name: Ariana Miller MRN: [id number] Date of Birth: Dec 14, 1923  Clinical Social Work is seeking post-discharge placement for this patient at the [place] level of care (*CSW will initial, date and re-position this form in  chart as items are completed):  Yes   Patient/family provided with [place] Work Department's list of facilities offering this level of care within the geographic area requested by the patient (or if unable, by the patient's family).  Yes   Patient/family informed of their freedom to choose among providers that offer the needed level of care, that participate in Medicare, Medicaid or managed care program needed by the patient, have an available bed and are willing to accept the patient.  Yes   Patient/family informed of [facility]'s ownership interest in [facility] and [facility], as well as of the fact that they are under no obligation to receive care at these facilities.  PASRR submitted to EDS on 05/25/17     PASRR number received on       Existing PASRR number confirmed on 05/25/17     FL2 transmitted to all facilities in geographic area requested by pt/family on 05/25/17     FL2 transmitted to all facilities within larger geographic area on       Patient informed that his/her managed care company has contracts with or will negotiate with certain facilities, including the following:        Yes   Patient/family informed of bed offers received.  Patient chooses bed at [facility])     Physician recommends and patient chooses bed at [facility])    [facility].  Patient to be transferred to facility by (EMS)     Patient family notified on 05/29/17 of transfer.  Name of family member notified:  Jana Half: daughter)     PHYSICIAN       Additional Comment:     _______________________________________________ Shela Leff, LCSW 05/29/2017, 8:13 AM

## 2017-06-15 ENCOUNTER — Emergency Department: Payer: Medicare Other

## 2017-06-15 ENCOUNTER — Other Ambulatory Visit: Payer: Self-pay

## 2017-06-15 ENCOUNTER — Emergency Department
Admission: EM | Admit: 2017-06-15 | Discharge: 2017-06-15 | Disposition: A | Discharge status: Home / Self Care | Payer: Medicare Other | Attending: Emergency Medicine | Admitting: Emergency Medicine

## 2017-06-15 DIAGNOSIS — N390 Urinary tract infection, site not specified: Secondary | ICD-10-CM | POA: Insufficient documentation

## 2017-06-15 DIAGNOSIS — Z8551 Personal history of malignant neoplasm of bladder: Secondary | ICD-10-CM | POA: Insufficient documentation

## 2017-06-15 DIAGNOSIS — Z79899 Other long term (current) drug therapy: Secondary | ICD-10-CM | POA: Insufficient documentation

## 2017-06-15 DIAGNOSIS — Z9049 Acquired absence of other specified parts of digestive tract: Secondary | ICD-10-CM | POA: Diagnosis not present

## 2017-06-15 DIAGNOSIS — I5030 Unspecified diastolic (congestive) heart failure: Secondary | ICD-10-CM | POA: Diagnosis not present

## 2017-06-15 DIAGNOSIS — I2510 Atherosclerotic heart disease of native coronary artery without angina pectoris: Secondary | ICD-10-CM | POA: Diagnosis not present

## 2017-06-15 DIAGNOSIS — J45909 Unspecified asthma, uncomplicated: Secondary | ICD-10-CM | POA: Diagnosis not present

## 2017-06-15 DIAGNOSIS — E119 Type 2 diabetes mellitus without complications: Secondary | ICD-10-CM | POA: Diagnosis not present

## 2017-06-15 DIAGNOSIS — Z952 Presence of prosthetic heart valve: Secondary | ICD-10-CM | POA: Insufficient documentation

## 2017-06-15 DIAGNOSIS — Z7982 Long term (current) use of aspirin: Secondary | ICD-10-CM | POA: Insufficient documentation

## 2017-06-15 DIAGNOSIS — I110 Hypertensive heart disease with heart failure: Secondary | ICD-10-CM | POA: Diagnosis not present

## 2017-06-15 DIAGNOSIS — R4182 Altered mental status, unspecified: Secondary | ICD-10-CM | POA: Diagnosis present

## 2017-06-15 DIAGNOSIS — Z87891 Personal history of nicotine dependence: Secondary | ICD-10-CM | POA: Diagnosis not present

## 2017-06-15 LAB — URINALYSIS, COMPLETE (UACMP) WITH MICROSCOPIC
BILIRUBIN URINE: NEGATIVE
GLUCOSE, UA: NEGATIVE mg/dL
Ketones, ur: NEGATIVE mg/dL
NITRITE: POSITIVE — AB
PH: 5 (ref 5.0–8.0)
Protein, ur: 30 mg/dL — AB
SPECIFIC GRAVITY, URINE: 1.014 (ref 1.005–1.030)
Squamous Epithelial / LPF: NONE SEEN (ref 0–5)
WBC, UA: >50 WBC/hpf — ABNORMAL HIGH (ref 0–5)

## 2017-06-15 LAB — COMPREHENSIVE METABOLIC PANEL
ALT: 15 U/L (ref 14–54)
AST: 31 U/L (ref 15–41)
Albumin: 2.4 g/dL — ABNORMAL LOW (ref 3.5–5.0)
Alkaline Phosphatase: 115 U/L (ref 38–126)
Anion gap: 7 (ref 5–15)
BUN: 42 mg/dL — AB (ref 6–20)
CHLORIDE: 105 mmol/L (ref 101–111)
CO2: 23 mmol/L (ref 22–32)
Calcium: 8.1 mg/dL — ABNORMAL LOW (ref 8.9–10.3)
Creatinine, Ser: 1.05 mg/dL — ABNORMAL HIGH (ref 0.44–1.00)
GFR calc non Af Amer: 45 mL/min — ABNORMAL LOW (ref >60)
GFR, EST AFRICAN AMERICAN: 52 mL/min — AB (ref >60)
Glucose, Bld: 166 mg/dL — ABNORMAL HIGH (ref 65–99)
POTASSIUM: 3.7 mmol/L (ref 3.5–5.1)
Sodium: 135 mmol/L (ref 135–145)
Total Bilirubin: 0.7 mg/dL (ref 0.3–1.2)
Total Protein: 7 g/dL (ref 6.5–8.1)

## 2017-06-15 LAB — CBC WITH DIFFERENTIAL/PLATELET
BAND NEUTROPHILS: 0 %
BASOS ABS: 0 10*3/uL (ref 0–0.1)
Basophils Relative: 0 %
Blasts: 0 %
EOS PCT: 5 %
Eosinophils Absolute: 0.5 10*3/uL (ref 0–0.7)
HCT: 29.1 % — ABNORMAL LOW (ref 35.0–47.0)
Hemoglobin: 9.6 g/dL — ABNORMAL LOW (ref 12.0–16.0)
LYMPHS ABS: 1.4 10*3/uL (ref 1.0–3.6)
Lymphocytes Relative: 13 %
MCH: 32 pg (ref 26.0–34.0)
MCHC: 32.8 g/dL (ref 32.0–36.0)
MCV: 97.5 fL (ref 80.0–100.0)
MONOS PCT: 7 %
Metamyelocytes Relative: 0 %
Monocytes Absolute: 0.8 10*3/uL (ref 0.2–0.9)
Myelocytes: 0 %
NEUTROS ABS: 8.1 10*3/uL — AB (ref 1.4–6.5)
Neutrophils Relative %: 75 %
Other: 0 %
PLATELETS: 95 10*3/uL — AB (ref 150–440)
Promyelocytes Relative: 0 %
RBC: 2.99 MIL/uL — AB (ref 3.80–5.20)
RDW: 21.8 % — AB (ref 11.5–14.5)
WBC: 10.8 10*3/uL (ref 3.6–11.0)
nRBC: 0 /100 WBC

## 2017-06-15 LAB — TROPONIN I: TROPONIN I: 0.09 ng/mL — AB (ref <0.03)

## 2017-06-15 LAB — LIPASE, BLOOD: LIPASE: 51 U/L (ref 11–51)

## 2017-06-15 MED ORDER — LEVOFLOXACIN IN D5W 500 MG/100ML IV SOLN
500.0000 mg | Freq: Once | INTRAVENOUS | Status: AC
  Administered 2017-06-15: 500 mg via INTRAVENOUS
  Filled 2017-06-15: qty 100

## 2017-06-15 MED ORDER — LEVOFLOXACIN 250 MG PO TABS: 375.0000 mg | ORAL_TABLET | Freq: Every day | ORAL | 0 refills | Status: AC

## 2017-06-15 NOTE — ED Notes (Signed)
Pt taken to [facility] by [facility]

## 2017-06-15 NOTE — ED Notes (Signed)
Pt cathed/ tol well/ warm blankets provided to patient/ pt pleasant/ skin warm and dry. resp even and nonlabored/ sr on monitor at 62. Cont to monitor

## 2017-06-15 NOTE — ED Provider Notes (Signed)
[facility] Emergency Department Provider Note  Time seen: 1:48 PM  I have reviewed the triage vital signs and the nursing notes.   HISTORY  Chief Complaint Altered Mental Status (per family states pt discharged on may 27th for sepsis/pt isnt as alert as 2 days ago)    HPI Ariana Miller is a 84 y.o. female with a past medical history of anemia, arthritis, CAD, diabetes, hypertension, aortic stenosis status post valve replacement presents to the emergency department for altered mental status.  According to the family daughter and granddaughter the patient was discharged from hospital 05/29/2017 after an admission for a blood infection.  Patient is currently taking antibiotics the patient has become so weak she is no longer able to walk.  Patient is currently at a skilled rehab facility, [facility].  They state they checked blood work there and it had elevated from 10-11.5, they were concerned that the white blood cell count was going up and that the patient was still not walking and still seemed very weak they also think that her color looks more pale today so they requested that she be brought to the emergency department for evaluation.  Family requested that the patient go to [facility] since her cardiac history is at [facility] but the family says EMS would not bring them to [facility] and brought them here instead.  Upon my initial evaluation the family states they do not want any work-up performed here they want the patient to be transferred to [facility], but they also state that the patient is nonambulatory and they are unable to take the patient to [facility].  After much discussion the family is agreeable to allowing [place] to do a medical work-up in the emergency department here.    Past Medical History:  Diagnosis Date  . Anemia   . Arthritis   . Asthma   . Bladder cancer ([place])   . CAD (coronary artery disease)    a. 2-V CABG 03/2007 (LIMA-LAD, SVG-D1)  . Diabetes mellitus with  complication ([place])   . Diastolic heart failure ([place])   . History of gastric ulcer   . HTN (hypertension)   . Pulmonary fibrosis ([place])   . Severe aortic stenosis    a. s/p Carpentier-Edwards AVR 03/2007; b. echo 2009 (pre-AVR): nl LVEF, mild LVH, severe AS, mild MS, trivial AR/MR/TR/PR, nl RVSF    Patient Active Problem List   Diagnosis Date Noted  . Sepsis ([place]) 05/23/2017  . Dyspnea 03/15/2017  . Hypokalemia 03/15/2017  . Benign essential hypertension 10/30/2015  . Rheumatic disease of heart valve 10/27/2015  . Weakness 10/19/2015  . Pressure injury of skin 10/19/2015  . Interstitial pulmonary fibrosis ([place]) 03/12/2015  . Reactive airway disease that is not asthma 03/02/2015  . Hyponatremia 03/02/2015  . Diabetes mellitus type 2, controlled, without complications ([place]) 73/22/0254  . Accelerated hypertension 03/02/2015  . CAD (coronary artery disease) 03/02/2015  . Hyperlipidemia, mixed 02/26/2015    Past Surgical History:  Procedure Laterality Date  . AORTIC VALVE REPLACEMENT (AVR)/CORONARY ARTERY BYPASS GRAFTING (CABG)    . CARDIAC VALVE REPLACEMENT    . CAROTID ENDARTERECTOMY    . CHOLECYSTECTOMY    . CORONARY ARTERY BYPASS GRAFT    . CYSTOSCOPY W/ RETROGRADES Bilateral 12/21/2015   Procedure: CYSTOSCOPY WITH RETROGRADE PYELOGRAM;  Surgeon: Hollice Espy, MD;  Location: [facility] ORS;  Service: Urology;  Laterality: Bilateral;  . EYE SURGERY Right    Catract Extraction with IOL  . TRANSURETHRAL RESECTION OF BLADDER TUMOR  WITH MITOMYCIN-C N/A 12/21/2015   Procedure: TRANSURETHRAL RESECTION OF BLADDER TUMOR WITH MITOMYCIN-C;  Surgeon: Hollice Espy, MD;  Location: [facility] ORS;  Service: Urology;  Laterality: N/A;    Prior to Admission medications   Medication Sig Start Date End Date Taking? Authorizing Provider  acetaminophen (TYLENOL) 325 MG tablet Take 2 tablets (650 mg total) by mouth every 6 (six) hours as needed for mild pain (or Fever >/= 101). 05/29/17   Loletha Grayer,  MD  amLODipine (NORVASC) 5 MG tablet Take 1 tablet (5 mg total) by mouth daily. 05/29/17   Loletha Grayer, MD  amoxicillin (AMOXIL) 500 MG capsule Take 1 capsule (500 mg total) by mouth every 8 (eight) hours. Treat through July 2nd 05/29/17   Loletha Grayer, MD  aspirin EC 81 MG tablet Take 81 mg by mouth daily.    [provider]  docusate sodium (COLACE) 100 MG capsule Take 1 capsule (100 mg total) by mouth 2 (two) times daily. 05/29/17   Loletha Grayer, MD  ferrous sulfate 325 (65 FE) MG tablet Take 325 mg by mouth daily with breakfast.    [provider]  ibuprofen (ADVIL,MOTRIN) 400 MG tablet Take 1 tablet (400 mg total) by mouth every 6 (six) hours as needed for fever, headache, moderate pain or cramping. 05/29/17   Loletha Grayer, MD  Melatonin 5 MG TABS Take 1 tablet (5 mg total) by mouth at bedtime. 05/29/17   Loletha Grayer, MD  metoprolol succinate (TOPROL-XL) 25 MG 24 hr tablet Take 0.5 tablets (12.5 mg total) by mouth at bedtime. 05/29/17   Loletha Grayer, MD  risperiDONE (RISPERDAL) 0.5 MG tablet Take 1 tablet (0.5 mg total) by mouth at bedtime as needed (insomnia). 05/29/17   Loletha Grayer, MD    No Known Allergies  Family History  Problem Relation Age of Onset  . Hepatitis Mother   . Liver disease Father   . Asthma Unknown   . Osteoporosis Unknown   . Hypertension Unknown   . Bladder Cancer Neg Hx   . Kidney cancer Neg Hx     Social History Social History   Tobacco Use  . Smoking status: Former Smoker    Types: Cigarettes  . Smokeless tobacco: Never Used  Substance Use Topics  . Alcohol use: No  . Drug use: No    Review of Systems Unable to obtain an adequate/accurate review of systems as the patient is somnolent, weak, altered mental status per family.  ____________________________________________   PHYSICAL EXAM:  VITAL SIGNS: ED Triage Vitals  Enc Vitals Group     BP 06/15/17 1324 (!) 134/56     Pulse Rate 06/15/17 1324 (!)  54     Resp --      Temp --      Temp src --      SpO2 --      Weight 06/15/17 1326 140 lb (63.5 kg)     Height 06/15/17 1326 5' (1.524 m)     Head Circumference --      Peak Flow --      Pain Score 06/15/17 1325 0     Pain Loc --      Pain Edu? --      Excl. in [place]? --    Constitutional: Patient is alert, somnolent but awakens easily to voice, will make eye contact.  Family states largely non-English-speaking, but they are talking to the patient and she is not answering questions.  Per EMS nursing home states this has been  her baseline since arriving to the nursing facility 2 weeks ago, family states she is still very weak, not walking, but admit this is ongoing for 2 weeks. Eyes: Normal exam there is no scleral icterus. ENT   Head: Normocephalic and atraumatic   Mouth/Throat: Mucous membranes are moist. Cardiovascular: Normal rate, regular rhythm. Respiratory: Normal respiratory effort without tachypnea nor retractions. Breath sounds are clear Gastrointestinal: Soft and nontender. No distention.  No reaction to abdominal palpation. Musculoskeletal: Nontender with normal range of motion in all extremities.  Neurologic: Patient is somewhat weak, does appear to move all extremities at times.  Will occasionally shake her head yes or no when asked questions in Spanish, but somewhat inaccurate. Skin:  Skin is warm, dry and intact.  Psychiatric: Appears to be weak and somnolent.  He does awaken to voice, does make eye contact, will shake her head yes or no on occasion to questions asked (in [place])  ____________________________________________    RADIOLOGY  Chest x-ray shows no acute abnormality  ____________________________________________   INITIAL IMPRESSION / ASSESSMENT AND PLAN / ED COURSE  Pertinent labs & imaging results that were available during my care of the patient were reviewed by me and considered in my medical decision making (see chart for details).  Patient  presents to the emergency department for continued weakness since being discharged 2 weeks ago to [facility].  Differential would include ongoing infection, metabolic or electrolyte abnormality, anemia, deconditioning.  Family initially states they did not want to be evaluated here they would prefer to go to [facility] as this is where the patient's cardiac history is performed.  However I discussed with him that I cannot transfer her to [facility] without first performing a medical work-up per EMTALA, and as the patient is nonambulatory the family is not able to provide transportation for the patient.  They are currently exploring options is to look for a wheelchair Lucianne Lei or private ambulance they could take the patient to [facility].  After much discussion they are agreeable to allow [place] to a medical evaluation in the emergency department including labs and a chest x-ray.  In speaking to the family it appears that the patient has not been improving at [facility], more so than declining.  They state the patient is receiving her antibiotics but continues to be weak and is still not walking.  There does not appear to be any acute decline in cognition or capability.  I reviewed the patient's recent discharge summary 05/29/2017.  Patient was admitted with sepsis found to be due to group B streptococcus.  Infectious diseases was involved and recommended switching the patient oral amoxicillin through July 2.  Patient was placed on Risperdal for insomnia, this medication could contribute to the patient's weakness and decreased ambulatory capacity.  Patient was also diagnosed with an NSTEMI at that time which they believe was secondary to sepsis.  Patient had encephalopathy which again was metabolic secondary to sepsis.  Weakness in which physical therapy was consulted recommending rehab placement which was provided at [facility].  His labs have resulted largely at baseline.  Troponin is slightly elevated although  significantly decreased from 2 weeks ago.  Patient's urinalysis has resulted showing urinary tract infection.  Patient is currently taking amoxicillin for her group B strep infection, likely resistant to amoxicillin.  We will avoid the penicillin and cephalosporin families, I sent a urine culture.  We will start the patient on a dose of IV Levaquin.  I discussed with the family  if repeat temperature is negative we will discharge the patient with Levaquin.  If it is elevated they would prefer the patient to be admitted.  Given the patient's recent group B strep infection we will also send repeat blood cultures as a precaution to ensure that the patient is no longer experiencing bacteremia at the family reports that the patient is having intermittent fevers at her nursing facility over the past 1 week.  Highly suspect this is due to the urinary tract infection.      ____________________________________________   FINAL CLINICAL IMPRESSION(S) / ED DIAGNOSES  Weakness Urinary tract infection   Harvest Dark, MD 06/15/17 1617

## 2017-06-15 NOTE — ED Triage Notes (Signed)
Pt unable to speak english/pt from nursing home/ nursing home states she is at baseline but daughter states not baseline mental status/pt alert and awake

## 2017-06-15 NOTE — Discharge Instructions (Addendum)
You have been seen in the emergency department today.  Your work-up was consistent with a urinary tract infection.  Please take your antibiotic as prescribed starting tomorrow 06/16/2017.  A urine culture has been sent.  Blood cultures have been sent.  Return to the emergency department for any personally concerning symptoms.

## 2017-06-18 LAB — URINE CULTURE

## 2017-06-19 ENCOUNTER — Other Ambulatory Visit: Payer: Self-pay

## 2017-06-19 ENCOUNTER — Inpatient Hospital Stay
Admission: EM | Admit: 2017-06-19 | Discharge: 2017-06-21 | DRG: 309 | Disposition: A | Discharge status: Home Health | Payer: Medicare Other | Attending: Internal Medicine | Admitting: Internal Medicine

## 2017-06-19 ENCOUNTER — Emergency Department: Payer: Medicare Other

## 2017-06-19 DIAGNOSIS — D649 Anemia, unspecified: Secondary | ICD-10-CM | POA: Diagnosis present

## 2017-06-19 DIAGNOSIS — E782 Mixed hyperlipidemia: Secondary | ICD-10-CM | POA: Diagnosis present

## 2017-06-19 DIAGNOSIS — I110 Hypertensive heart disease with heart failure: Secondary | ICD-10-CM | POA: Diagnosis present

## 2017-06-19 DIAGNOSIS — R51 Headache: Secondary | ICD-10-CM | POA: Diagnosis present

## 2017-06-19 DIAGNOSIS — N390 Urinary tract infection, site not specified: Secondary | ICD-10-CM | POA: Diagnosis present

## 2017-06-19 DIAGNOSIS — J45909 Unspecified asthma, uncomplicated: Secondary | ICD-10-CM | POA: Diagnosis present

## 2017-06-19 DIAGNOSIS — E119 Type 2 diabetes mellitus without complications: Secondary | ICD-10-CM | POA: Diagnosis present

## 2017-06-19 DIAGNOSIS — R0789 Other chest pain: Secondary | ICD-10-CM | POA: Diagnosis present

## 2017-06-19 DIAGNOSIS — I361 Nonrheumatic tricuspid (valve) insufficiency: Secondary | ICD-10-CM | POA: Diagnosis not present

## 2017-06-19 DIAGNOSIS — I441 Atrioventricular block, second degree: Secondary | ICD-10-CM | POA: Diagnosis present

## 2017-06-19 DIAGNOSIS — I5032 Chronic diastolic (congestive) heart failure: Secondary | ICD-10-CM | POA: Diagnosis present

## 2017-06-19 DIAGNOSIS — Z7189 Other specified counseling: Secondary | ICD-10-CM | POA: Diagnosis not present

## 2017-06-19 DIAGNOSIS — Z953 Presence of xenogenic heart valve: Secondary | ICD-10-CM

## 2017-06-19 DIAGNOSIS — R001 Bradycardia, unspecified: Secondary | ICD-10-CM | POA: Diagnosis present

## 2017-06-19 DIAGNOSIS — Z87440 Personal history of urinary (tract) infections: Secondary | ICD-10-CM | POA: Diagnosis not present

## 2017-06-19 DIAGNOSIS — Z951 Presence of aortocoronary bypass graft: Secondary | ICD-10-CM

## 2017-06-19 DIAGNOSIS — I2510 Atherosclerotic heart disease of native coronary artery without angina pectoris: Secondary | ICD-10-CM | POA: Diagnosis present

## 2017-06-19 DIAGNOSIS — R627 Adult failure to thrive: Secondary | ICD-10-CM | POA: Diagnosis present

## 2017-06-19 DIAGNOSIS — J8410 Pulmonary fibrosis, unspecified: Secondary | ICD-10-CM | POA: Diagnosis present

## 2017-06-19 DIAGNOSIS — R7881 Bacteremia: Secondary | ICD-10-CM | POA: Diagnosis present

## 2017-06-19 DIAGNOSIS — Z515 Encounter for palliative care: Secondary | ICD-10-CM

## 2017-06-19 DIAGNOSIS — I6529 Occlusion and stenosis of unspecified carotid artery: Secondary | ICD-10-CM | POA: Diagnosis present

## 2017-06-19 DIAGNOSIS — Z6828 Body mass index (BMI) 28.0-28.9, adult: Secondary | ICD-10-CM

## 2017-06-19 DIAGNOSIS — R079 Chest pain, unspecified: Secondary | ICD-10-CM | POA: Diagnosis not present

## 2017-06-19 DIAGNOSIS — Z66 Do not resuscitate: Secondary | ICD-10-CM | POA: Diagnosis present

## 2017-06-19 DIAGNOSIS — R54 Age-related physical debility: Secondary | ICD-10-CM | POA: Diagnosis present

## 2017-06-19 DIAGNOSIS — Z8551 Personal history of malignant neoplasm of bladder: Secondary | ICD-10-CM

## 2017-06-19 DIAGNOSIS — I350 Nonrheumatic aortic (valve) stenosis: Secondary | ICD-10-CM | POA: Diagnosis present

## 2017-06-19 DIAGNOSIS — B9620 Unspecified Escherichia coli [E. coli] as the cause of diseases classified elsewhere: Secondary | ICD-10-CM | POA: Diagnosis present

## 2017-06-19 DIAGNOSIS — F0390 Unspecified dementia without behavioral disturbance: Secondary | ICD-10-CM | POA: Diagnosis present

## 2017-06-19 DIAGNOSIS — I10 Essential (primary) hypertension: Secondary | ICD-10-CM | POA: Diagnosis present

## 2017-06-19 DIAGNOSIS — Z87891 Personal history of nicotine dependence: Secondary | ICD-10-CM

## 2017-06-19 DIAGNOSIS — I495 Sick sinus syndrome: Secondary | ICD-10-CM | POA: Diagnosis not present

## 2017-06-19 DIAGNOSIS — I208 Other forms of angina pectoris: Secondary | ICD-10-CM | POA: Diagnosis not present

## 2017-06-19 HISTORY — DX: Unspecified diastolic (congestive) heart failure: I50.30

## 2017-06-19 LAB — CBC WITH DIFFERENTIAL/PLATELET
BASOS PCT: 1 %
BLASTS: 0 %
Band Neutrophils: 1 %
Basophils Absolute: 0.1 10*3/uL (ref 0–0.1)
Eosinophils Absolute: 0.4 10*3/uL (ref 0–0.7)
Eosinophils Relative: 4 %
HCT: 29.1 % — ABNORMAL LOW (ref 35.0–47.0)
Hemoglobin: 9.6 g/dL — ABNORMAL LOW (ref 12.0–16.0)
Lymphocytes Relative: 9 %
Lymphs Abs: 0.9 10*3/uL — ABNORMAL LOW (ref 1.0–3.6)
MCH: 32.1 pg (ref 26.0–34.0)
MCHC: 32.9 g/dL (ref 32.0–36.0)
MCV: 97.6 fL (ref 80.0–100.0)
MONO ABS: 0.8 10*3/uL (ref 0.2–0.9)
MYELOCYTES: 1 %
Metamyelocytes Relative: 2 %
Monocytes Relative: 8 %
NEUTROS PCT: 74 %
NRBC: 1 /100{WBCs} — AB
Neutro Abs: 8.2 10*3/uL — ABNORMAL HIGH (ref 1.4–6.5)
Other: 0 %
PLATELETS: 103 10*3/uL — AB (ref 150–440)
PROMYELOCYTES RELATIVE: 0 %
RBC: 2.99 MIL/uL — AB (ref 3.80–5.20)
RDW: 21.9 % — ABNORMAL HIGH (ref 11.5–14.5)
SMEAR REVIEW: ADEQUATE
WBC: 10.4 10*3/uL (ref 3.6–11.0)

## 2017-06-19 LAB — BASIC METABOLIC PANEL
ANION GAP: 6 (ref 5–15)
BUN: 38 mg/dL — ABNORMAL HIGH (ref 6–20)
CALCIUM: 8.2 mg/dL — AB (ref 8.9–10.3)
CO2: 25 mmol/L (ref 22–32)
Chloride: 103 mmol/L (ref 101–111)
Creatinine, Ser: 1.02 mg/dL — ABNORMAL HIGH (ref 0.44–1.00)
GFR, EST AFRICAN AMERICAN: 54 mL/min — AB (ref >60)
GFR, EST NON AFRICAN AMERICAN: 46 mL/min — AB (ref >60)
Glucose, Bld: 155 mg/dL — ABNORMAL HIGH (ref 65–99)
Potassium: 4.8 mmol/L (ref 3.5–5.1)
Sodium: 134 mmol/L — ABNORMAL LOW (ref 135–145)

## 2017-06-19 LAB — MAGNESIUM: Magnesium: 1.8 mg/dL (ref 1.7–2.4)

## 2017-06-19 LAB — TROPONIN I: Troponin I: 0.09 ng/mL (ref <0.03)

## 2017-06-19 LAB — LACTIC ACID, PLASMA: Lactic Acid, Venous: 1.8 mmol/L (ref 0.5–1.9)

## 2017-06-19 MED ORDER — MAGNESIUM SULFATE 2 GM/50ML IV SOLN
2.0000 g | Freq: Once | INTRAVENOUS | Status: AC
  Administered 2017-06-19: 2 g via INTRAVENOUS
  Filled 2017-06-19: qty 50

## 2017-06-19 MED ORDER — IPRATROPIUM-ALBUTEROL 0.5-2.5 (3) MG/3ML IN SOLN
3.0000 mL | Freq: Once | RESPIRATORY_TRACT | Status: AC
  Administered 2017-06-19: 3 mL via RESPIRATORY_TRACT
  Filled 2017-06-19: qty 3

## 2017-06-19 NOTE — ED Notes (Signed)
Cup of water provided to pt. 

## 2017-06-19 NOTE — ED Notes (Signed)
Resumed care from [place] rn.

## 2017-06-19 NOTE — H&P (Signed)
[facility] at [place] NAME: Ariana Miller    MR#:  [id number]  DATE OF BIRTH:  Sep 06, 1923  DATE OF ADMISSION:  06/19/2017  PRIMARY CARE PHYSICIAN: Rusty Aus, MD   REQUESTING/REFERRING PHYSICIAN: Alfred Levins, MD  CHIEF COMPLAINT:   Chief Complaint  Patient presents with  . Headache    HISTORY OF PRESENT ILLNESS:  Ariana Miller  is a 84 y.o. female who presents with headaches and bradycardia.  Patient was found here to have new onset of Mobitz type II AV block rhythm.  Her troponin is mildly elevated at 0.09.  Per family at bedside her heart rate has been reading low for the past couple of days, but her daughter thought that the blood pressure machine was not reading correctly.  Patient has been having significant headaches intermittently for the past couple of days.  Tonight the patient developed some chest pain as well.  Hospitalist were called for admission  PAST MEDICAL HISTORY:   Past Medical History:  Diagnosis Date  . Anemia   . Arthritis   . Asthma   . Bladder cancer ([place])   . CAD (coronary artery disease)    a. 2-V CABG 03/2007 (LIMA-LAD, SVG-D1)  . Diabetes mellitus with complication ([place])   . Diastolic heart failure ([place])   . History of gastric ulcer   . HTN (hypertension)   . Pulmonary fibrosis ([place])   . Severe aortic stenosis    a. s/p Carpentier-Edwards AVR 03/2007; b. echo 2009 (pre-AVR): nl LVEF, mild LVH, severe AS, mild MS, trivial AR/MR/TR/PR, nl RVSF     PAST SURGICAL HISTORY:   Past Surgical History:  Procedure Laterality Date  . AORTIC VALVE REPLACEMENT (AVR)/CORONARY ARTERY BYPASS GRAFTING (CABG)    . CARDIAC VALVE REPLACEMENT    . CAROTID ENDARTERECTOMY    . CHOLECYSTECTOMY    . CORONARY ARTERY BYPASS GRAFT    . CYSTOSCOPY W/ RETROGRADES Bilateral 12/21/2015   Procedure: CYSTOSCOPY WITH RETROGRADE PYELOGRAM;  Surgeon: Hollice Espy, MD;  Location: [facility] ORS;  Service: Urology;  Laterality:  Bilateral;  . EYE SURGERY Right    Catract Extraction with IOL  . TRANSURETHRAL RESECTION OF BLADDER TUMOR WITH MITOMYCIN-C N/A 12/21/2015   Procedure: TRANSURETHRAL RESECTION OF BLADDER TUMOR WITH MITOMYCIN-C;  Surgeon: Hollice Espy, MD;  Location: [facility] ORS;  Service: Urology;  Laterality: N/A;     SOCIAL HISTORY:   Social History   Tobacco Use  . Smoking status: Former Smoker    Types: Cigarettes  . Smokeless tobacco: Never Used  Substance Use Topics  . Alcohol use: No     FAMILY HISTORY:   Family History  Problem Relation Age of Onset  . Hepatitis Mother   . Liver disease Father   . Asthma Unknown   . Osteoporosis Unknown   . Hypertension Unknown   . Bladder Cancer Neg Hx   . Kidney cancer Neg Hx      DRUG ALLERGIES:  No Known Allergies  MEDICATIONS AT HOME:   Prior to Admission medications   Medication Sig Start Date End Date Taking? Authorizing Provider  acetaminophen (TYLENOL) 325 MG tablet Take 2 tablets (650 mg total) by mouth every 6 (six) hours as needed for mild pain (or Fever >/= 101). 05/29/17  Yes Wieting, Richard, MD  amLODipine (NORVASC) 5 MG tablet Take 1 tablet (5 mg total) by mouth daily. Patient taking differently: Take 2.5 mg by mouth daily.  05/29/17  Yes Loletha Grayer, MD  amoxicillin (  AMOXIL) 500 MG capsule Take 1 capsule (500 mg total) by mouth every 8 (eight) hours. Treat through July 2nd 05/29/17  Yes Wieting, Richard, MD  docusate sodium (COLACE) 100 MG capsule Take 1 capsule (100 mg total) by mouth 2 (two) times daily. 05/29/17  Yes Wieting, Richard, MD  ferrous sulfate 325 (65 FE) MG tablet Take 325 mg by mouth 2 (two) times daily.    Yes [provider]  ibuprofen (ADVIL,MOTRIN) 400 MG tablet Take 1 tablet (400 mg total) by mouth every 6 (six) hours as needed for fever, headache, moderate pain or cramping. 05/29/17  Yes Wieting, Richard, MD  levofloxacin (LEVAQUIN) 250 MG tablet Take 1.5 tablets (375 mg total) by mouth daily for  7 days. 06/15/17 06/22/17 Yes Paduchowski, Lennette Bihari, MD  Melatonin 5 MG TABS Take 1 tablet (5 mg total) by mouth at bedtime. 05/29/17  Yes Wieting, Richard, MD  metoprolol succinate (TOPROL-XL) 25 MG 24 hr tablet Take 0.5 tablets (12.5 mg total) by mouth at bedtime. 05/29/17  Yes Wieting, Richard, MD  Multiple Vitamin (MULTIVITAMIN WITH MINERALS) TABS tablet Take 1 tablet by mouth daily.   Yes [provider]  potassium chloride SA (K-DUR,KLOR-CON) 20 MEQ tablet Take 20 mEq by mouth 2 (two) times daily.   Yes [provider]  risperiDONE (RISPERDAL) 0.5 MG tablet Take 1 tablet (0.5 mg total) by mouth at bedtime as needed (insomnia). 05/29/17  Yes Wieting, Richard, MD  vitamin C (ASCORBIC ACID) 250 MG tablet Take 500 mg by mouth 2 (two) times daily.   Yes [provider]    REVIEW OF SYSTEMS:  Review of Systems  Constitutional: Negative for chills, fever, malaise/fatigue and weight loss.  HENT: Negative for ear pain, hearing loss and tinnitus.   Eyes: Negative for blurred vision, double vision, pain and redness.  Respiratory: Negative for cough, hemoptysis and shortness of breath.   Cardiovascular: Negative for chest pain, palpitations, orthopnea and leg swelling.  Gastrointestinal: Negative for abdominal pain, constipation, diarrhea, nausea and vomiting.  Genitourinary: Negative for dysuria, frequency and hematuria.  Musculoskeletal: Negative for back pain, joint pain and neck pain.  Skin:       No acne, rash, or lesions  Neurological: Positive for headaches. Negative for dizziness, tremors, focal weakness and weakness.  Endo/Heme/Allergies: Negative for polydipsia. Does not bruise/bleed easily.  Psychiatric/Behavioral: Negative for depression. The patient is not nervous/anxious and does not have insomnia.      VITAL SIGNS:   Vitals:   06/19/17 2145 06/19/17 2202 06/19/17 2230 06/19/17 2300  BP:  108/66 115/62 (!) 145/41  Pulse: (!) 34     Resp: 19     Temp:       TempSrc:      SpO2: 97%     Weight:      Height:       Wt Readings from Last 3 Encounters:  06/19/17 63.5 kg (140 lb)  06/15/17 63.5 kg (140 lb)  05/29/17 86.2 kg (190 lb)    PHYSICAL EXAMINATION:  Physical Exam  Vitals reviewed. Constitutional: She is oriented to person, place, and time. She appears well-developed and well-nourished. No distress.  HENT:  Head: Normocephalic and atraumatic.  Mouth/Throat: Oropharynx is clear and moist.  Eyes: Pupils are equal, round, and reactive to light. Conjunctivae and EOM are normal. No scleral icterus.  Neck: Normal range of motion. Neck supple. No JVD present. No thyromegaly present.  Cardiovascular: Intact distal pulses. Exam reveals no gallop and no friction rub.  No murmur  heard. Bradycardic  Respiratory: Effort normal and breath sounds normal. No respiratory distress. She has no wheezes. She has no rales.  GI: Soft. Bowel sounds are normal. She exhibits no distension. There is no tenderness.  Musculoskeletal: Normal range of motion. She exhibits no edema.  No arthritis, no gout  Lymphadenopathy:    She has no cervical adenopathy.  Neurological: She is alert and oriented to person, place, and time. No cranial nerve deficit.  No dysarthria, no aphasia  Skin: Skin is warm and dry. No rash noted. No erythema.  Psychiatric: She has a normal mood and affect. Her behavior is normal. Judgment and thought content normal.    LABORATORY PANEL:   CBC Recent Labs  Lab 06/19/17 2026  WBC 10.4  HGB 9.6*  HCT 29.1*  PLT 103*   ------------------------------------------------------------------------------------------------------------------  Chemistries  Recent Labs  Lab 06/15/17 1357 06/19/17 2026 06/19/17 2033  NA 135 134*  --   K 3.7 4.8  --   CL 105 103  --   CO2 23 25  --   GLUCOSE 166* 155*  --   BUN 42* 38*  --   CREATININE 1.05* 1.02*  --   CALCIUM 8.1* 8.2*  --   MG  --   --  1.8  AST 31  --   --   ALT 15  --   --    ALKPHOS 115  --   --   BILITOT 0.7  --   --    ------------------------------------------------------------------------------------------------------------------  Cardiac Enzymes Recent Labs  Lab 06/19/17 2026  TROPONINI 0.09*   ------------------------------------------------------------------------------------------------------------------  RADIOLOGY:  Dg Chest 2 View  Result Date: 06/19/2017 CLINICAL DATA:  84 year old female with headache and shortness of breath for 3 days. EXAM: CHEST - 2 VIEW COMPARISON:  Portable chest 06/15/2017 and earlier. FINDINGS: Prior CABG. Prior CABG and cardiac valve replacement. Stable cardiac size and mediastinal contours. Stable lung volumes. Chronic pulmonary interstitial opacity. No pneumothorax or definite pleural effusion. No acute pulmonary opacity identified. Stable visualized osseous structures. Chronic deformity of the proximal left humerus and right humerus arthroplasty. Negative visible bowel gas pattern. IMPRESSION: Stable.  No acute cardiopulmonary abnormality. Electronically Signed   By: Genevie Ann M.D.   On: 06/19/2017 20:29    EKG:   Orders placed or performed during the hospital encounter of 06/19/17  . ED EKG  . ED EKG  . EKG 12-Lead  . EKG 12-Lead  . EKG 12-Lead  . EKG 12-Lead    IMPRESSION AND PLAN:  Principal Problem:   Chest pain -cycle cardiac enzymes, get an echocardiogram and cardiology consult Active Problems:   Mobitz type II block -hold any nodal blocking agents, heart rate is currently sustaining 40s to 50s, monitor closely on telemetry and get a cardiology consult   Diabetes mellitus type 2, controlled, without complications (HCC) -sliding scale insulin with corresponding glucose checks   CAD (coronary artery disease) -continue home meds except for nodal blocking agents and pursue other work-up as above   Benign essential hypertension -patient's blood pressure is stable at this time, goal is less than 160/100, treat  as needed   Hyperlipidemia, mixed -Home dose antilipid  Chart review performed and case discussed with ED provider. Labs, imaging and/or ECG reviewed by provider and discussed with patient/family. Management plans discussed with the patient and/or family.  DVT PROPHYLAXIS: SubQ lovenox  GI PROPHYLAXIS: None  ADMISSION STATUS: Inpatient  CODE STATUS: DNR Code Status History    Date Active Date Inactive Code  Status Order ID Comments User Context   05/23/2017 1126 05/29/2017 1515 DNR [id number]  Bettey Costa, MD Inpatient   03/15/2017 0256 03/16/2017 1743 DNR [id number]  Lance Coon, MD Inpatient   10/19/2015 1122 10/20/2015 1751 DNR [id number]  Nicholes Mango, MD Inpatient   10/19/2015 1014 10/19/2015 1121 Full Code [id number]  Harvie Bridge, DO Inpatient   03/03/2015 0127 03/05/2015 1702 Full Code [id number]  Lance Coon, MD Inpatient    Questions for Most Recent Historical Code Status (Order [id number])    Question Answer Comment   In the event of cardiac or respiratory ARREST Do not call a "code blue"    In the event of cardiac or respiratory ARREST Do not perform Intubation, CPR, defibrillation or ACLS    In the event of cardiac or respiratory ARREST Use medication by any route, position, wound care, and other measures to relive pain and suffering. May use oxygen, suction and manual treatment of airway obstruction as needed for comfort.    Comments RN may pronounce       TOTAL TIME TAKING CARE OF THIS PATIENT: 45 minutes.   Ollen Rao [place] 06/19/2017, 11:34 PM  [facility]  Office  [phone]  CC: Primary care physician; Rusty Aus, MD  Note:  This document was prepared using Dragon voice recognition software and may include unintentional dictation errors.

## 2017-06-19 NOTE — ED Triage Notes (Signed)
Pt arrived via ems with headache x3 days and SOB. Pt was just discharged from [facility] today. Pt also had UTI and blood infection x3 days. EMS vitals: 122/36, 71, 98% on RA, and CBG-184.

## 2017-06-19 NOTE — ED Provider Notes (Signed)
[facility] Provider Note  ____________________________________________  Time seen: Approximately 8:11 PM  I have reviewed the triage vital signs and the nursing notes.   HISTORY  Chief Complaint Headache  Level 5 caveat:  Portions of the history and physical were unable to be obtained due to dementia   HPI Ariana Miller is a 84 y.o. female with a recent admission for bacteremia on 05/29/2017, with a history of asthma, CAD status post CABG, diabetes, CHF, hypertension, pulmonary fibrosis, and aortic stenosis who presents for evaluation of coughing and shortness of breath.  Patient has been at rehab for 3 weeks since being admitted for seizure on 05/23/17 and found to be bacteremic. Spinal tap negative for seizure. Patient continues to be on amoxicillin. She was also started on levaquin 4 days after a visit to the ED showed a UTI.  Patient went home today.  She has been complaining of intermittent headaches and also cough and shortness of breath for the last few days.  Today after arriving at home patient started saying that she could not breathe.  Daughter says that she does not have an inhaler at home and brought her to the emergency room for evaluation.  She reports that patient had a temp of 68F today.  She has received a Tylenol for her headache.  At this time patient's only complaint is of chest pain.  She points to the center of her chest.  She is unable to provide any details about the chest pain.  Daughter reports that she has had a dry cough.  No vomiting or diarrhea.  Patient denies abdominal pain, she denies headache.   Past Medical History:  Diagnosis Date  . Anemia   . Arthritis   . Asthma   . Bladder cancer ([place])   . CAD (coronary artery disease)    a. 2-V CABG 03/2007 (LIMA-LAD, SVG-D1)  . Diabetes mellitus with complication ([place])   . Diastolic heart failure ([place])   . History of gastric ulcer   . HTN (hypertension)   .  Pulmonary fibrosis ([place])   . Severe aortic stenosis    a. s/p Carpentier-Edwards AVR 03/2007; b. echo 2009 (pre-AVR): nl LVEF, mild LVH, severe AS, mild MS, trivial AR/MR/TR/PR, nl RVSF    Patient Active Problem List   Diagnosis Date Noted  . Sepsis ([place]) 05/23/2017  . Dyspnea 03/15/2017  . Hypokalemia 03/15/2017  . Benign essential hypertension 10/30/2015  . Rheumatic disease of heart valve 10/27/2015  . Weakness 10/19/2015  . Pressure injury of skin 10/19/2015  . Interstitial pulmonary fibrosis ([place]) 03/12/2015  . Reactive airway disease that is not asthma 03/02/2015  . Hyponatremia 03/02/2015  . Diabetes mellitus type 2, controlled, without complications ([place]) 24/40/1027  . Accelerated hypertension 03/02/2015  . CAD (coronary artery disease) 03/02/2015  . Hyperlipidemia, mixed 02/26/2015    Past Surgical History:  Procedure Laterality Date  . AORTIC VALVE REPLACEMENT (AVR)/CORONARY ARTERY BYPASS GRAFTING (CABG)    . CARDIAC VALVE REPLACEMENT    . CAROTID ENDARTERECTOMY    . CHOLECYSTECTOMY    . CORONARY ARTERY BYPASS GRAFT    . CYSTOSCOPY W/ RETROGRADES Bilateral 12/21/2015   Procedure: CYSTOSCOPY WITH RETROGRADE PYELOGRAM;  Surgeon: Hollice Espy, MD;  Location: [facility] ORS;  Service: Urology;  Laterality: Bilateral;  . EYE SURGERY Right    Catract Extraction with IOL  . TRANSURETHRAL RESECTION OF BLADDER TUMOR WITH MITOMYCIN-C N/A 12/21/2015   Procedure: TRANSURETHRAL RESECTION OF BLADDER TUMOR WITH MITOMYCIN-C;  Surgeon:  Hollice Espy, MD;  Location: [facility] ORS;  Service: Urology;  Laterality: N/A;    Prior to Admission medications   Medication Sig Start Date End Date Taking? Authorizing Provider  acetaminophen (TYLENOL) 325 MG tablet Take 2 tablets (650 mg total) by mouth every 6 (six) hours as needed for mild pain (or Fever >/= 101). 05/29/17  Yes Wieting, Richard, MD  amLODipine (NORVASC) 5 MG tablet Take 1 tablet (5 mg total) by mouth daily. Patient taking differently:  Take 2.5 mg by mouth daily.  05/29/17  Yes Wieting, Richard, MD  amoxicillin (AMOXIL) 500 MG capsule Take 1 capsule (500 mg total) by mouth every 8 (eight) hours. Treat through July 2nd 05/29/17  Yes Wieting, Richard, MD  docusate sodium (COLACE) 100 MG capsule Take 1 capsule (100 mg total) by mouth 2 (two) times daily. 05/29/17  Yes Wieting, Richard, MD  ferrous sulfate 325 (65 FE) MG tablet Take 325 mg by mouth 2 (two) times daily.    Yes [provider]  ibuprofen (ADVIL,MOTRIN) 400 MG tablet Take 1 tablet (400 mg total) by mouth every 6 (six) hours as needed for fever, headache, moderate pain or cramping. 05/29/17  Yes Wieting, Richard, MD  levofloxacin (LEVAQUIN) 250 MG tablet Take 1.5 tablets (375 mg total) by mouth daily for 7 days. 06/15/17 06/22/17 Yes Paduchowski, Lennette Bihari, MD  Melatonin 5 MG TABS Take 1 tablet (5 mg total) by mouth at bedtime. 05/29/17  Yes Wieting, Richard, MD  metoprolol succinate (TOPROL-XL) 25 MG 24 hr tablet Take 0.5 tablets (12.5 mg total) by mouth at bedtime. 05/29/17  Yes Wieting, Richard, MD  Multiple Vitamin (MULTIVITAMIN WITH MINERALS) TABS tablet Take 1 tablet by mouth daily.   Yes [provider]  potassium chloride SA (K-DUR,KLOR-CON) 20 MEQ tablet Take 20 mEq by mouth 2 (two) times daily.   Yes [provider]  risperiDONE (RISPERDAL) 0.5 MG tablet Take 1 tablet (0.5 mg total) by mouth at bedtime as needed (insomnia). 05/29/17  Yes Wieting, Richard, MD  vitamin C (ASCORBIC ACID) 250 MG tablet Take 500 mg by mouth 2 (two) times daily.   Yes [provider]    Allergies Patient has no known allergies.  Family History  Problem Relation Age of Onset  . Hepatitis Mother   . Liver disease Father   . Asthma Unknown   . Osteoporosis Unknown   . Hypertension Unknown   . Bladder Cancer Neg Hx   . Kidney cancer Neg Hx     Social History Social History   Tobacco Use  . Smoking status: Former Smoker    Types: Cigarettes  .  Smokeless tobacco: Never Used  Substance Use Topics  . Alcohol use: No  . Drug use: No    Review of Systems  Constitutional: Negative for fever. Eyes: Negative for visual changes. ENT: Negative for sore throat. Neck: No neck pain  Cardiovascular: + chest pain. Respiratory: + shortness of breath, cough Gastrointestinal: Negative for abdominal pain, vomiting or diarrhea. Genitourinary: Negative for dysuria. Musculoskeletal: Negative for back pain. Skin: Negative for rash. Neurological: Negative for weakness or numbness. + HA Psych: No SI or HI  ____________________________________________   PHYSICAL EXAM:  VITAL SIGNS: Vitals:   06/19/17 2100 06/19/17 2130  BP: (!) 115/38 (!) 148/36  Pulse: (!) 36   Resp: 20 (!) 21  Temp:    SpO2: 100%     Constitutional: Alert and oriented x 1, smiling, no distress.  HEENT:      Head: Normocephalic and  atraumatic.         Eyes: Conjunctivae are normal. Sclera is non-icteric.       Mouth/Throat: Mucous membranes are moist.       Neck: Supple with no signs of meningismus. Cardiovascular: Regular rate and rhythm. No murmurs, gallops, or rubs. 2+ symmetrical distal pulses are present in all extremities. No JVD. Respiratory: Normal respiratory effort. Lungs are clear to auscultation bilaterally with slightly diminished air movement and bibasilar crackles.  Gastrointestinal: Soft, non tender, and non distended with positive bowel sounds. No rebound or guarding. Musculoskeletal: Nontender with normal range of motion in all extremities. No edema, cyanosis, or erythema of extremities. Neurologic: Normal speech and language. Face is symmetric. Moving all extremities. No gross focal neurologic deficits are appreciated. Skin: Skin is warm, dry and intact. No rash noted. Psychiatric: Mood and affect are normal. Speech and behavior are normal.  ____________________________________________   LABS (all labs ordered are listed, but only abnormal  results are displayed)  Labs Reviewed  CBC WITH DIFFERENTIAL/PLATELET - Abnormal; Notable for the following components:      Result Value   RBC 2.99 (*)    Hemoglobin 9.6 (*)    HCT 29.1 (*)    RDW 21.9 (*)    Platelets 103 (*)    nRBC 1 (*)    Neutro Abs 8.2 (*)    Lymphs Abs 0.9 (*)    All other components within normal limits  BASIC METABOLIC PANEL - Abnormal; Notable for the following components:   Sodium 134 (*)    Glucose, Bld 155 (*)    BUN 38 (*)    Creatinine, Ser 1.02 (*)    Calcium 8.2 (*)    GFR calc non Af Amer 46 (*)    GFR calc Af Amer 54 (*)    All other components within normal limits  TROPONIN I - Abnormal; Notable for the following components:   Troponin I 0.09 (*)    All other components within normal limits  LACTIC ACID, PLASMA  MAGNESIUM  URINALYSIS, COMPLETE (UACMP) WITH MICROSCOPIC   ____________________________________________  EKG  ED ECG REPORT I, Rudene Re, the attending physician, personally viewed and interpreted this ECG.  Second-degree AV block Mobitz type II, rate of 62, prolonged QTC, right axis deviation, right bundle branch block and LPFB, no ST elevations or depressions.  These changes are all new when compared to prior. ____________________________________________  RADIOLOGY  I have personally reviewed the images performed during this visit and I agree with the Radiologist's read.   Interpretation by Radiologist:  Dg Chest 2 View  Result Date: 06/19/2017 CLINICAL DATA:  84 year old female with headache and shortness of breath for 3 days. EXAM: CHEST - 2 VIEW COMPARISON:  Portable chest 06/15/2017 and earlier. FINDINGS: Prior CABG. Prior CABG and cardiac valve replacement. Stable cardiac size and mediastinal contours. Stable lung volumes. Chronic pulmonary interstitial opacity. No pneumothorax or definite pleural effusion. No acute pulmonary opacity identified. Stable visualized osseous structures. Chronic deformity of the  proximal left humerus and right humerus arthroplasty. Negative visible bowel gas pattern. IMPRESSION: Stable.  No acute cardiopulmonary abnormality. Electronically Signed   By: Genevie Ann M.D.   On: 06/19/2017 20:29     ____________________________________________   PROCEDURES  Procedure(s) performed: None Procedures Critical Care performed:  None ____________________________________________   INITIAL IMPRESSION / ASSESSMENT AND PLAN / ED COURSE  84 y.o. female with a recent admission for bacteremia on 05/29/2017, with a history of asthma, CAD status post CABG, diabetes, CHF,  hypertension, pulmonary fibrosis, and aortic stenosis who presents for evaluation of coughing, chest pain, and shortness of breath.  Patient has come home from rehab today after 21 days.  She was there after an admission for bacteremia.  She continues to be on amoxicillin and Levaquin.  Has not had a fever for the last 2 to 3 days.  Has been complaining for 3 days intermittently of shortness of breath.  She has a cough.  She does have bibasilar crackles and slightly diminished air movement.  She has a history of pulmonary fibrosis and asthma.  Her vitals here are within normal limits, normal work of breathing, normal sats, and afebrile with no signs of sepsis.  We will give DuoNeb's and do a chest x-ray to rule out pneumonia.  Will check basic labs and EKG to rule out ischemia.    _________________________ 10:23 PM on 06/19/2017 -----------------------------------------  Chest x-ray with no evidence of pneumonia.  EKG showing new Mobitz type II heart block.  Discussed with Dr. Oval Linsey from [facility] cardiology who recommended admission on telemetry for monitoring and stopping patient's beta-blocker.  Based on lab work there is no signs of dehydration or electrolyte abnormalities.  Patient's troponin is 0.09 which seems to be patient's baseline.  There is no signs of infection with normal white count, no fever, normal  lactic acid.  UA is pending.  Will discuss with the hospitalist for admission peer   As part of my medical decision making, I reviewed the following data within the [place] notes reviewed and incorporated, Labs reviewed , EKG interpreted , Old EKG reviewed, Old chart reviewed, Radiograph reviewed , Discussed with admitting physician , A consult was requested and obtained from this/these consultant(s) cardiology, Notes from prior ED visits and [place] Controlled Substance Database    Pertinent labs & imaging results that were available during my care of the patient were reviewed by me and considered in my medical decision making (see chart for details).    ____________________________________________   FINAL CLINICAL IMPRESSION(S) / ED DIAGNOSES  Final diagnoses:  Mobitz type II block  Chest pain, unspecified type      NEW MEDICATIONS STARTED DURING THIS VISIT:  ED Discharge Orders    None       Note:  This document was prepared using Dragon voice recognition software and may include unintentional dictation errors.    Rudene Re, MD 06/19/17 2224

## 2017-06-19 NOTE — ED Notes (Addendum)
Patient transported to X-ray 

## 2017-06-19 NOTE — ED Notes (Signed)
Pt waiting of admission.  Family with pt.  Iv in place.  Sinus brady on monitor.

## 2017-06-20 ENCOUNTER — Encounter: Payer: Self-pay | Admitting: *Deleted

## 2017-06-20 ENCOUNTER — Inpatient Hospital Stay (HOSPITAL_COMMUNITY)
Admit: 2017-06-20 | Discharge: 2017-06-20 | Disposition: A | Discharge status: Home / Self Care | Payer: Medicare Other | Attending: Internal Medicine | Admitting: Internal Medicine

## 2017-06-20 DIAGNOSIS — Z7189 Other specified counseling: Secondary | ICD-10-CM

## 2017-06-20 DIAGNOSIS — I361 Nonrheumatic tricuspid (valve) insufficiency: Secondary | ICD-10-CM

## 2017-06-20 DIAGNOSIS — Z515 Encounter for palliative care: Secondary | ICD-10-CM

## 2017-06-20 DIAGNOSIS — R627 Adult failure to thrive: Secondary | ICD-10-CM

## 2017-06-20 DIAGNOSIS — I441 Atrioventricular block, second degree: Principal | ICD-10-CM

## 2017-06-20 DIAGNOSIS — R079 Chest pain, unspecified: Secondary | ICD-10-CM

## 2017-06-20 LAB — BASIC METABOLIC PANEL
Anion gap: 6 (ref 5–15)
BUN: 37 mg/dL — AB (ref 6–20)
CALCIUM: 8.3 mg/dL — AB (ref 8.9–10.3)
CO2: 25 mmol/L (ref 22–32)
Chloride: 103 mmol/L (ref 101–111)
Creatinine, Ser: 1.03 mg/dL — ABNORMAL HIGH (ref 0.44–1.00)
GFR calc Af Amer: 53 mL/min — ABNORMAL LOW (ref >60)
GFR, EST NON AFRICAN AMERICAN: 46 mL/min — AB (ref >60)
GLUCOSE: 133 mg/dL — AB (ref 65–99)
Potassium: 4.4 mmol/L (ref 3.5–5.1)
SODIUM: 134 mmol/L — AB (ref 135–145)

## 2017-06-20 LAB — CBC
HCT: 29.1 % — ABNORMAL LOW (ref 35.0–47.0)
Hemoglobin: 9.4 g/dL — ABNORMAL LOW (ref 12.0–16.0)
MCH: 31.7 pg (ref 26.0–34.0)
MCHC: 32.3 g/dL (ref 32.0–36.0)
MCV: 98.2 fL (ref 80.0–100.0)
PLATELETS: 114 10*3/uL — AB (ref 150–440)
RBC: 2.97 MIL/uL — ABNORMAL LOW (ref 3.80–5.20)
RDW: 22.1 % — AB (ref 11.5–14.5)
WBC: 10.3 10*3/uL (ref 3.6–11.0)

## 2017-06-20 LAB — TROPONIN I
TROPONIN I: 0.1 ng/mL — AB (ref <0.03)
TROPONIN I: 0.13 ng/mL — AB (ref <0.03)
Troponin I: 0.09 ng/mL (ref <0.03)

## 2017-06-20 LAB — CULTURE, BLOOD (ROUTINE X 2)
Culture: NO GROWTH
Culture: NO GROWTH
SPECIAL REQUESTS: ADEQUATE
Special Requests: ADEQUATE

## 2017-06-20 LAB — URINALYSIS, COMPLETE (UACMP) WITH MICROSCOPIC
Bilirubin Urine: NEGATIVE
Glucose, UA: NEGATIVE mg/dL
Hgb urine dipstick: NEGATIVE
Ketones, ur: NEGATIVE mg/dL
Nitrite: NEGATIVE
Protein, ur: NEGATIVE mg/dL
SPECIFIC GRAVITY, URINE: 1.013 (ref 1.005–1.030)
pH: 5 (ref 5.0–8.0)

## 2017-06-20 LAB — ECHOCARDIOGRAM COMPLETE
HEIGHTINCHES: 60 in
WEIGHTICAEL: 2318.4 [oz_av]

## 2017-06-20 LAB — GLUCOSE, CAPILLARY
Glucose-Capillary: 121 mg/dL — ABNORMAL HIGH (ref 65–99)
Glucose-Capillary: 105 mg/dL — ABNORMAL HIGH (ref 65–99)

## 2017-06-20 MED ORDER — LEVOFLOXACIN 750 MG PO TABS
375.0000 mg | ORAL_TABLET | Freq: Every day | ORAL | Status: DC
  Administered 2017-06-20 – 2017-06-21 (×2): 375 mg via ORAL
  Filled 2017-06-20 (×2): qty 1

## 2017-06-20 MED ORDER — RISPERIDONE 0.5 MG PO TABS
0.5000 mg | ORAL_TABLET | Freq: Every evening | ORAL | Status: DC | PRN
Start: 2017-06-20 — End: 2017-06-21
  Filled 2017-06-20: qty 1

## 2017-06-20 MED ORDER — ENOXAPARIN SODIUM 30 MG/0.3ML ~~LOC~~ SOLN
30.0000 mg | SUBCUTANEOUS | Status: DC
  Administered 2017-06-20 – 2017-06-21 (×2): 30 mg via SUBCUTANEOUS
  Filled 2017-06-20 (×2): qty 0.3

## 2017-06-20 MED ORDER — ONDANSETRON HCL 4 MG PO TABS: 4.0000 mg | ORAL_TABLET | Freq: Four times a day (QID) | ORAL | Status: DC | PRN

## 2017-06-20 MED ORDER — SODIUM CHLORIDE 0.9 % IV SOLN
INTRAVENOUS | Status: AC
  Administered 2017-06-20: 13:00:00 via INTRAVENOUS

## 2017-06-20 MED ORDER — ONDANSETRON HCL 4 MG/2ML IJ SOLN: 4.0000 mg | Freq: Four times a day (QID) | INTRAMUSCULAR | Status: DC | PRN

## 2017-06-20 MED ORDER — INSULIN ASPART 100 UNIT/ML ~~LOC~~ SOLN
0.0000 [IU] | Freq: Four times a day (QID) | SUBCUTANEOUS | Status: DC
  Administered 2017-06-20: 1 [IU] via SUBCUTANEOUS
  Filled 2017-06-20: qty 1

## 2017-06-20 MED ORDER — ACETAMINOPHEN 325 MG PO TABS
650.0000 mg | ORAL_TABLET | Freq: Four times a day (QID) | ORAL | Status: DC | PRN
  Administered 2017-06-20 – 2017-06-21 (×2): 650 mg via ORAL
  Filled 2017-06-20 (×2): qty 2

## 2017-06-20 MED ORDER — ACETAMINOPHEN 650 MG RE SUPP: 650.0000 mg | Freq: Four times a day (QID) | RECTAL | Status: DC | PRN

## 2017-06-20 MED ORDER — AMOXICILLIN 500 MG PO CAPS
500.0000 mg | ORAL_CAPSULE | Freq: Three times a day (TID) | ORAL | Status: DC
  Administered 2017-06-20 – 2017-06-21 (×6): 500 mg via ORAL
  Filled 2017-06-20 (×6): qty 1

## 2017-06-20 MED ORDER — ASPIRIN 81 MG PO CHEW
81.0000 mg | CHEWABLE_TABLET | Freq: Every day | ORAL | Status: DC
  Administered 2017-06-20 – 2017-06-21 (×2): 81 mg via ORAL
  Filled 2017-06-20 (×2): qty 1

## 2017-06-20 NOTE — Plan of Care (Signed)
  Problem: Pain Managment: Goal: General experience of comfort will improve Outcome: Progressing   Problem: Safety: Goal: Ability to remain free from injury will improve Outcome: 70   Spanish speaking daughter at bedside who speaks english, receiving PO abx for UTI. NPO currently except medications.

## 2017-06-20 NOTE — Evaluation (Signed)
Clinical/Bedside Swallow Evaluation Patient Details  Name: Ariana Miller MRN: [id number] Date of Birth: December 02, 1923  Today's Date: 06/20/2017 Time: SLP Start Time (ACUTE ONLY): 1350 SLP Stop Time (ACUTE ONLY): 1415 SLP Time Calculation (min) (ACUTE ONLY): 25 min  Past Medical History:  Past Medical History:  Diagnosis Date  . (HFpEF) heart failure with preserved ejection fraction ([place])    a. 04/2017 Echo: EF 55-60%, Gr2 DD.  [place] Anemia   . Arthritis   . Asthma   . Bladder cancer ([place])   . CAD (coronary artery disease)    a. 2-V CABG 03/2007 (LIMA-LAD, SVG-D1)  . Diabetes mellitus with complication ([place])   . History of gastric ulcer   . HTN (hypertension)   . Pulmonary fibrosis ([place])   . Severe aortic stenosis    a. s/p Carpentier-Edwards AVR 03/2007; b. echo 2009 (pre-AVR): nl LVEF, mild LVH, severe AS, mild MS, trivial AR/MR/TR/PR, nl RVSF; c. 05/2017 Echo: EF 55-60%, Gr2 DD, bioprosthetic AoV, mean gradient 36mmHg. Mod MR. Mod dil LA. Nl RV fxn. PASP 60mmHg.   Past Surgical History:  Past Surgical History:  Procedure Laterality Date  . AORTIC VALVE REPLACEMENT (AVR)/CORONARY ARTERY BYPASS GRAFTING (CABG)    . CARDIAC VALVE REPLACEMENT    . CAROTID ENDARTERECTOMY    . CHOLECYSTECTOMY    . CORONARY ARTERY BYPASS GRAFT    . CYSTOSCOPY W/ RETROGRADES Bilateral 12/21/2015   Procedure: CYSTOSCOPY WITH RETROGRADE PYELOGRAM;  Surgeon: Hollice Espy, MD;  Location: [facility] ORS;  Service: Urology;  Laterality: Bilateral;  . EYE SURGERY Right    Catract Extraction with IOL  . TRANSURETHRAL RESECTION OF BLADDER TUMOR WITH MITOMYCIN-C N/A 12/21/2015   Procedure: TRANSURETHRAL RESECTION OF BLADDER TUMOR WITH MITOMYCIN-C;  Surgeon: Hollice Espy, MD;  Location: [facility] ORS;  Service: Urology;  Laterality: N/A;   HPI:  Pt is a 84 y.o. female with a known history of multiple medical issues including Dementia chronic anemia, bladder cancer, CAD/CABG, diabetes and chronic diastolic heart failure with  preserved ejection fraction who presents to the emergency room due to a fall and generalized weakness. No focal neurological deficits were noted.  She was brought to the ER by her family members, while in the emergency room she had a witnessed 32nd generalized tonic-clonic seizure in the emergency room.  She has never had seizures in her life.  She also was noted to have a temperature of 102. Patient was lethargic after being given Ativan for her seizure. Currently, more awake w/ verbal cues. Pt had been eating a little lunch w/ Dtr in room. Dtr reported pt eats soft foods at home AND wears her dentures.    Assessment / Plan / Recommendation Clinical Impression  pt presented with a moderte oral phase dysphagia as characterized by missing dentition and her dentures were not present. daughter states that her dentures no longer fit appropriatly and the dentist is unwilling to continue to modify them as it will no longer assist in the denture fitting well. SLP educated that if the denture does not fit properly, it is more of a dietary impairment than assistive device. Daugther verbally agreed. pt did not attempt to masticate semi solid bolus trials and immediatly swallowed the bolus. pt had no overt ssx aspiration noted with trials of solids or liquids. SLP recommends to increase diet to puree with thin liquids and pt may use straws to decrease risk of aspiration and increase nutritional intake. daughter verbally agreed to diet recommendation. Daughter stated she had previously been on  puree but they had requested the facility increase her diet to fine chop. SLP educated on need to continue with puree trials at this time. RN and family agree to diet.  SLP Visit Diagnosis: Dysphagia, oral phase (R13.11)    Aspiration Risk  Mild aspiration risk    Diet Recommendation Dysphagia 1 (Puree);Thin liquid   Liquid Administration via: Cup;Straw Medication Administration: Crushed with puree Supervision: Patient able  to self feed Compensations: Slow rate;Small sips/bites;Follow solids with liquid Postural Changes: Seated upright at 90 degrees    Other  Recommendations Oral Care Recommendations: Oral care BID;Staff/trained caregiver to provide oral care   Follow up Recommendations        Frequency and Duration min 2x/week  2 weeks       Prognosis Prognosis for Safe Diet Advancement: Fair Barriers to Reach Goals: Cognitive deficits      Swallow Study   General Date of Onset: 06/20/17 HPI: Pt is a 84 y.o. female with a known history of multiple medical issues including Dementia chronic anemia, bladder cancer, CAD/CABG, diabetes and chronic diastolic heart failure with preserved ejection fraction who presents to the emergency room due to a fall and generalized weakness. No focal neurological deficits were noted.  She was brought to the ER by her family members, while in the emergency room she had a witnessed 32nd generalized tonic-clonic seizure in the emergency room.  She has never had seizures in her life.  She also was noted to have a temperature of 102. Patient was lethargic after being given Ativan for her seizure. Currently, more awake w/ verbal cues. Pt had been eating a little lunch w/ Dtr in room. Dtr reported pt eats soft foods at home AND wears her dentures.  Type of Study: Bedside Swallow Evaluation Diet Prior to this Study: NPO Temperature Spikes Noted: No Respiratory Status: Nasal cannula History of Recent Intubation: No Behavior/Cognition: Cooperative;Pleasant mood;Confused;Distractible;Requires cueing Oral Cavity Assessment: Within Functional Limits Oral Care Completed by SLP: Recent completion by staff Oral Cavity - Dentition: Edentulous Vision: Functional for self-feeding Self-Feeding Abilities: Needs assist Patient Positioning: Upright in bed Baseline Vocal Quality: Low vocal intensity Volitional Cough: Cognitively unable to elicit Volitional Swallow: Unable to elicit     Oral/Motor/Sensory Function Overall Oral Motor/Sensory Function: Within functional limits   Ice Chips Ice chips: Within functional limits Presentation: Spoon   Thin Liquid Thin Liquid: Within functional limits Presentation: Straw    Nectar Thick Nectar Thick Liquid: Not tested   Honey Thick Honey Thick Liquid: Not tested   Puree Puree: Within functional limits Presentation: Spoon   Solid   GO   Solid: Impaired Oral Phase Impairments: Reduced labial seal;Reduced lingual movement/coordination;Poor awareness of bolus;Impaired mastication        Stacie Harris Sauber 06/20/2017,2:28 PM

## 2017-06-20 NOTE — Consult Note (Signed)
Consultation Note Date: 06/20/2017   Patient Name: Ariana Miller  DOB: Aug 29, 1923  MRN: [id number]  Age / Sex: 84 y.o., female  PCP: Rusty Aus, MD Referring Physician: Hillary Bow, MD  Reason for Consultation: Establishing goals of care  HPI/Patient Profile: 84 y.o. female  with past medical history of CAD s/p CABG, bioprosthetic AVR (2009), HTN, HLD, carotid artery stenosis, T2DM, anemia, bladder cancer, asthma, pulmonary fibrosis, OA, and recent group B strep bacteremia admitted on 06/19/2017 with headache and chest pain. Patient found to be bradycardic and second degree AV block. Troponin 0.1. Patient was recently at [facility] for rehab and was d/c'd home 6/17 - was admitted to the hospital same day. Per cardiology, patient needs cardiac cath but d/t comorbidities this not a good option. Also not a candidate for a pacemaker. PMT consulted d/t declining function and to establish goals of care.  Clinical Assessment and Goals of Care: I have reviewed medical records including EPIC notes, labs and imaging, assessed the patient and then met at the bedside along with patient's daughter, Jana Half,  to discuss diagnosis prognosis, [place], EOL wishes, disposition and options.  I introduced Palliative Medicine as specialized medical care for people living with serious illness. It focuses on providing relief from the symptoms and stress of a serious illness. The goal is to improve quality of life for both the patient and the family.  As far as functional and nutritional status, daughter tells me of significant decline. Patient is non-ambulatory and dependent in ADLs. Will sometimes feed herself but needs a lot of encouragement to do so. Appetite has been declining. Daughter tells me of a cognitive decline as well - becomes confused easily and forgetful.     We discussed her current illness and what it means in the larger context of her on-going co-morbidities.   Natural disease trajectory and expectations at EOL were discussed. Specifically, we discussed the patient's complex medical history and now heart block. We discussed her poor functional and nutritional status. We discussed that evaluation and treatment options are limited d/t her frailty, age, and comorbidities. Daughter has a good understanding and realizes that her mother is quite ill and debilitated. Recognizes and verbalizes that she may be near the end of life.   I attempted to elicit values and goals of care important to the patient.    The difference between aggressive medical intervention and comfort care was considered in light of the patient's goals of care. The daughter is unsure of how to proceed and would like to allow more time to see how her mother does. If declines or does not improve, she is interested in comfort care. The main goal is for her mother to return home - she is not interested in rehab.   Advanced directives, concepts specific to code status, artifical feeding and hydration, and rehospitalization were considered and discussed.  Hospice and Palliative Care services outpatient were explained and offered. At the minimum, we discussed patient will return home with palliative, but possibly hospice based on hospital course.   Questions and concerns were addressed. The family was encouraged to call with questions or concerns.   Primary Decision Maker NEXT OF KIN- daughter, Ermelinda Das  SUMMARY OF RECOMMENDATIONS   - daughter has good understanding of how ill her mother is - she is not interested in rehab placement - main goal is to return home - at the minimum, return home with palliative to follow but potentially interested in hospice depending on hospital course -  PMT will follow  Code Status/Advance Care Planning:  DNR  Symptom Management:   Per primary - denies currently  Additional Recommendations (Limitations, Scope, Preferences):  Full Scope  Treatment  Psycho-social/Spiritual:   Desire for further Chaplaincy support:no  Additional Recommendations: Education on Hospice  Prognosis:   Unable to determine poor prognosis r/t multiple comorbidities, poor functional status, and recurrent infections  Discharge Planning: Home with Palliative Services  and home health vs home with hospice     Primary Diagnoses: Present on Admission: . CAD (coronary artery disease) . Benign essential hypertension . Hyperlipidemia, mixed . Chest pain . Mobitz type II block   I have reviewed the medical record, interviewed the patient and family, and examined the patient. The following aspects are pertinent.  Past Medical History:  Diagnosis Date  . (HFpEF) heart failure with preserved ejection fraction ([place])    a. 04/2017 Echo: EF 55-60%, Gr2 DD.  Marland Kitchen Anemia   . Arthritis   . Asthma   . Bladder cancer ([place])   . CAD (coronary artery disease)    a. 2-V CABG 03/2007 (LIMA-LAD, SVG-D1)  . Diabetes mellitus with complication ([place])   . History of gastric ulcer   . HTN (hypertension)   . Pulmonary fibrosis ([place])   . Severe aortic stenosis    a. s/p Carpentier-Edwards AVR 03/2007; b. echo 2009 (pre-AVR): nl LVEF, mild LVH, severe AS, mild MS, trivial AR/MR/TR/PR, nl RVSF; c. 05/2017 Echo: EF 55-60%, Gr2 DD, bioprosthetic AoV, mean gradient 67mHg. Mod MR. Mod dil LA. Nl RV fxn. PASP 42mg.   Social History   Socioeconomic History  . Marital status: Single    Spouse name: Not on file  . Number of children: Not on file  . Years of education: Not on file  . Highest education level: Not on file  Occupational History  . Not on file  Social Needs  . Financial resource strain: Not on file  . Food insecurity:    Worry: Not on file    Inability: Not on file  . Transportation needs:    Medical: Not on file    Non-medical: Not on file  Tobacco Use  . Smoking status: Former Smoker    Types: Cigarettes  . Smokeless tobacco: Never Used   Substance and Sexual Activity  . Alcohol use: No  . Drug use: No  . Sexual activity: Not Currently  Lifestyle  . Physical activity:    Days per week: Not on file    Minutes per session: Not on file  . Stress: Not on file  Relationships  . Social connections:    Talks on phone: Not on file    Gets together: Not on file    Attends religious service: Not on file    Active member of club or organization: Not on file    Attends meetings of clubs or organizations: Not on file    Relationship status: Not on file  Other Topics Concern  . Not on file  Social History Narrative   Lives locally with daughter.  Just discharged from [facility] on 06/19/2017.   Family History  Problem Relation Age of Onset  . Hepatitis Mother   . Liver disease Father   . Asthma Unknown   . Osteoporosis Unknown   . Hypertension Unknown   . Bladder Cancer Neg Hx   . Kidney cancer Neg Hx    Scheduled Meds: . amoxicillin  500 mg Oral Q8H  . aspirin  81 mg Oral Daily  .  enoxaparin (LOVENOX) injection  30 mg Subcutaneous Q24H  . levofloxacin  375 mg Oral Daily   Continuous Infusions: . sodium chloride 75 mL/hr at 06/20/17 1236   PRN Meds:.acetaminophen **OR** acetaminophen, ondansetron **OR** ondansetron (ZOFRAN) IV, risperiDONE No Known Allergies Review of Systems  Unable to perform ROS: Dementia    Physical Exam  Constitutional: She appears lethargic. She is cooperative. No distress.  HENT:  Head: Normocephalic and atraumatic.  Cardiovascular: Bradycardia present.  Pulmonary/Chest: Effort normal. She has wheezes.  Abdominal: Soft. Bowel sounds are normal.  Musculoskeletal:       Right lower leg: She exhibits no edema.       Left lower leg: She exhibits no edema.  Neurological: She appears lethargic. She is disoriented.  Skin: Skin is warm and dry.  Psychiatric: She is withdrawn. Cognition and memory are impaired. She is inattentive.    Vital Signs: BP (!) 114/45 (BP Location: Left Arm)    Pulse 69   Temp 97.8 F (36.6 C)   Resp 18   Ht 5' (1.524 m)   Wt 65.7 kg (144 lb 14.4 oz)   SpO2 97%   BMI 28.30 kg/m  Pain Scale: Faces   Pain Score: Asleep   SpO2: SpO2: 97 % O2 Device:SpO2: 97 % O2 Flow Rate: .O2 Flow Rate (L/min): 1 L/min  IO: Intake/output summary:   Intake/Output Summary (Last 24 hours) at 06/20/2017 1406 Last data filed at 06/20/2017 0400 Gross per 24 hour  Intake -  Output 350 ml  Net -350 ml    LBM: Last BM Date: 06/17/17 Baseline Weight: Weight: 63.5 kg (140 lb) Most recent weight: Weight: 65.7 kg (144 lb 14.4 oz)     Palliative Assessment/Data: PPS 30%    Time Total: 80 minutes Greater than 50%  of this time was spent counseling and coordinating care related to the above assessment and plan.  Juel Burrow, DNP, AGNP-C Palliative Medicine Team [phone] Pager: [phone]

## 2017-06-20 NOTE — Progress Notes (Signed)
[facility] [place] NAME: Ariana Miller    MR#:  [id number]  DATE OF BIRTH:  1923/02/27  SUBJECTIVE:  CHIEF COMPLAINT:   Chief Complaint  Patient presents with  . Headache   patient laying in bed and wakes up easily. Daughter at bedside. No pain  REVIEW OF SYSTEMS:    Review of Systems  Unable to perform ROS: Mental status change    DRUG ALLERGIES:  No Known Allergies  VITALS:  Blood pressure (!) 114/45, pulse 69, temperature 97.8 F (36.6 C), resp. rate 18, height 5' (1.524 m), weight 65.7 kg (144 lb 14.4 oz), SpO2 97 %.  PHYSICAL EXAMINATION:   Physical Exam  GENERAL:  84 y.o.-year-old patient lying in the bed with no acute distress.  EYES: Pupils equal, round, reactive to light and accommodation. No scleral icterus. Extraocular muscles intact.  HEENT: Head atraumatic, normocephalic. Oropharynx and nasopharynx clear.  NECK:  Supple, no jugular venous distention. No thyroid enlargement, no tenderness.  LUNGS: Normal breath sounds bilaterally, no wheezing, rales, rhonchi. No use of accessory muscles of respiration.  CARDIOVASCULAR: S1, S2 normal. No murmurs, rubs, or gallops.  ABDOMEN: Soft, nontender, nondistended. Bowel sounds present. No organomegaly or mass.  EXTREMITIES: No cyanosis, clubbing or edema b/l.    NEUROLOGIC: Cranial nerves II through XII are intact. No focal Motor or sensory deficits b/l.   PSYCHIATRIC: The patient is alert and awake SKIN: No obvious rash, lesion, or ulcer.   LABORATORY PANEL:   CBC Recent Labs  Lab 06/20/17 0205  WBC 10.3  HGB 9.4*  HCT 29.1*  PLT 114*   ------------------------------------------------------------------------------------------------------------------ Chemistries  Recent Labs  Lab 06/15/17 1357  06/19/17 2033 06/20/17 0205  NA 135   < >  --  134*  K 3.7   < >  --  4.4  CL 105   < >  --  103  CO2 23   < >  --  25  GLUCOSE 166*   < >  --  133*  BUN 42*   < >   --  37*  CREATININE 1.05*   < >  --  1.03*  CALCIUM 8.1*   < >  --  8.3*  MG  --   --  1.8  --   AST 31  --   --   --   ALT 15  --   --   --   ALKPHOS 115  --   --   --   BILITOT 0.7  --   --   --    < > = values in this interval not displayed.   ------------------------------------------------------------------------------------------------------------------  Cardiac Enzymes Recent Labs  Lab 06/20/17 0811  TROPONINI 0.09*   ------------------------------------------------------------------------------------------------------------------  RADIOLOGY:  Dg Chest 2 View  Result Date: 06/19/2017 CLINICAL DATA:  84 year old female with headache and shortness of breath for 3 days. EXAM: CHEST - 2 VIEW COMPARISON:  Portable chest 06/15/2017 and earlier. FINDINGS: Prior CABG. Prior CABG and cardiac valve replacement. Stable cardiac size and mediastinal contours. Stable lung volumes. Chronic pulmonary interstitial opacity. No pneumothorax or definite pleural effusion. No acute pulmonary opacity identified. Stable visualized osseous structures. Chronic deformity of the proximal left humerus and right humerus arthroplasty. Negative visible bowel gas pattern. IMPRESSION: Stable.  No acute cardiopulmonary abnormality. Electronically Signed   By: Genevie Ann M.D.   On: 06/19/2017 20:29     ASSESSMENT AND PLAN:     Chest pain  chronic  and atypical. Patient with a history of CAD will ideally need cardiac catheterization but due to multiple comorbidities this is not being pursued.  2:1 AV block and Mobitz type 1 second degree AV block appreciate cardiology input. No pacemaker at this time.    Benign essential hypertension -patient's blood pressure is stable at this time, goal is less than 160/100, treat as needed    Hyperlipidemia, mixed -Home dose antilipid  Generalized weakness. Declining functional status over the past few weeks. Will request palliative care consult due to multiple  comorbidities and high risk for complications.  All the records are reviewed and case discussed with Care Management/Social Worker Management plans discussed with the patient, family and they are in agreement.  CODE STATUS: DNR  DVT Prophylaxis: SCDs  TOTAL TIME TAKING CARE OF THIS PATIENT: 35 minutes.   POSSIBLE D/C IN 1-2 DAYS, DEPENDING ON CLINICAL CONDITION.  Neita Carp M.D on 06/20/2017 at 1:54 PM  Between 7am to 6pm - Pager - [phone]  After 6pm go to [email or web address] - password [email or web address] [facility]  Office  [phone]  CC: Primary care physician; Rusty Aus, MD  Note: This dictation was prepared with Dragon dictation along with smaller phrase technology. Any transcriptional errors that result from this process are unintentional.

## 2017-06-20 NOTE — Progress Notes (Signed)
*  PRELIMINARY RESULTS* Echocardiogram 2D Echocardiogram has been performed.  Ariana Miller 06/20/2017, 3:00 PM

## 2017-06-20 NOTE — Consult Note (Signed)
Cardiology Consult    Patient ID: [name] MRN: [id number], DOB/AGE: 82/07/1923   Admit date: 06/19/2017 Date of Consult: 06/20/2017  Primary Physician: Rusty Aus, MD Primary Cardiologist: Ida Rogue, MD Requesting Provider: Chauncey Cruel. Sudini, MD  Patient Profile    Ariana Miller is a 84 y.o. female with a history of CAD s/p CABG x 2 (LIMA  LAD, VG  D1), Bioprosthetic AVR, HTN, HL, DMII, anemia, OA, bladder cancer s/p TURBT, asthma, IDA, dementia, pulm fibrosis, carotid dzs s/p L CEA, recent bacteremia, and UTI, who is being seen today for the evaluation of elevated troponin, bradycardia/Mobitz I, and chest pain at the request of Dr. Darvin Neighbours.  Past Medical History   Past Medical History:  Diagnosis Date  . (HFpEF) heart failure with preserved ejection fraction ([place])    a. 04/2017 Echo: EF 55-60%, Gr2 DD.  Marland Kitchen Anemia   . Arthritis   . Asthma   . Bladder cancer ([place])   . CAD (coronary artery disease)    a. 2-V CABG 03/2007 (LIMA-LAD, SVG-D1)  . Diabetes mellitus with complication ([place])   . History of gastric ulcer   . HTN (hypertension)   . Pulmonary fibrosis ([place])   . Severe aortic stenosis    a. s/p Carpentier-Edwards AVR 03/2007; b. echo 2009 (pre-AVR): nl LVEF, mild LVH, severe AS, mild MS, trivial AR/MR/TR/PR, nl RVSF; c. 04/2017 Echo: EF 55-60%, Gr2 DD, bioprosthetic AoV, mean gradient 19mHg. Mod MR. Mod dil LA. Nl RV fxn. PASP 499mg.    Past Surgical History:  Procedure Laterality Date  . AORTIC VALVE REPLACEMENT (AVR)/CORONARY ARTERY BYPASS GRAFTING (CABG)    . CARDIAC VALVE REPLACEMENT    . CAROTID ENDARTERECTOMY    . CHOLECYSTECTOMY    . CORONARY ARTERY BYPASS GRAFT    . CYSTOSCOPY W/ RETROGRADES Bilateral 12/21/2015   Procedure: CYSTOSCOPY WITH RETROGRADE PYELOGRAM;  Surgeon: AsHollice EspyMD;  Location: [facility] ORS;  Service: Urology;  Laterality: Bilateral;  . EYE SURGERY Right    Catract Extraction with IOL  . TRANSURETHRAL RESECTION OF BLADDER TUMOR  WITH MITOMYCIN-C N/A 12/21/2015   Procedure: TRANSURETHRAL RESECTION OF BLADDER TUMOR WITH MITOMYCIN-C;  Surgeon: AsHollice EspyMD;  Location: [facility] ORS;  Service: Urology;  Laterality: N/A;     Allergies  No Known Allergies  History of Present Illness    [age].o. female with a history of CAD s/p CABG x 2 (LIMA  LAD, VG  D1), Bioprosthetic AVR, HTN, HL, DMII, anemia, OA, bladder cancer s/p TURBT, asthma, IDA, dementia, pulm fibrosis, carotid dzs s/p L CEA, recent bacteremia, and UTI, who is being seen today for the evaluation of elevated troponin, bradycardia/Mobitz I, and chest pain.  Cardiac history dates back to 2009, at which time she underwent two-vessel bypass and bioprosthetic aortic valve replacement at [facility] She had not followed with cardiology for many years.  She lives locally with her daughter.  She was admitted in March of this year in the setting of dyspnea and viral illness.  She had mild troponin elevation to 0.09 during that admission and echocardiogram showed an EF of 60 to 65% with normal functioning aortic valve bioprosthesis.  It was not felt that she required ischemic evaluation at that time she was subsequently discharged.  She was readmitted on May 21 following a mechanical fall and while in the emergency department was noted to have a tonic-clonic seizure.  She was febrile at 102.  She was placed on empiric vancomycin and Rocephin and blood  cultures returned showing group B strep in both bottles.  She was seen by infectious disease and placed on IV antibiotics.  She was also seen by our team in the setting of elevated troponin with a peak of 2.82.  Repeat echocardiogram showed stable LV function and normally functioning bioprosthetic valve.  Again, conservative therapy was recommended.  She was eventually discharged to [facility] on May 27 for rehabilitation.  Her daughter states that patient was having intermittent fevers and also progressive weakness while at rehab.  She  never regained strength.  Patient has a long history of intermittent chest discomfort, dating back to her bypass surgery but her daughter notes that over the past few months, she has been complaining of more frequent episodes of chest discomfort, often lasting up to 2 hours and resolving after using Tylenol or ibuprofen.  While at [facility], she had intermittent chest pain which was generally preceded by headaches, and associated with dyspnea.  Symptoms were sometimes brief but could last up to several hours.  Patient's daughter took her to the emergency department earlier this month in the setting of weakness and fatigue and patient was found to have UTI.  She was placed on Levaquin.  Patient was discharged home from [facility] on June 17.  Daughter notes that she has remained very weak and seems to be very sleepy.  While at home yesterday, she complained of recurrent headache and chest pain with dyspnea.  Her daughter checked her blood pressure and though her systolic pressure was in the 160s, heart rates were in the 30s.  She continue to follow her blood pressure closely over the next 40 minutes and continued to note heart rates less than 50 with stable blood pressures.  She called EMS and patient was subsequently taken to the emergency department.  Here, ECG showed sinus rhythm at a rate of 60 bpm with Mobitz 1 and intermittent 2-1 heart block.  She was hemodynamically stable.  Troponin was mildly elevated at 0.09.  She was mildly anemic but this was stable.  White blood cell count stable at 10.3.  She was admitted for further evaluation.  She currently reports chest wall tenderness as well as diffuse abdominal tenderness.  Her daughter is at bedside and assists with interview as patient is fairly groggy.  Of note, though she was discharged home on low-dose metoprolol and amlodipine last month, her daughter says that she was not receiving this at [facility] or at home.  Inpatient Medications      . amoxicillin  500 mg Oral Q8H  . aspirin  81 mg Oral Daily  . enoxaparin (LOVENOX) injection  30 mg Subcutaneous Q24H  . insulin aspart  0-9 Units Subcutaneous Q6H  . levofloxacin  375 mg Oral Daily    Family History    Family History  Problem Relation Age of Onset  . Hepatitis Mother   . Liver disease Father   . Asthma Unknown   . Osteoporosis Unknown   . Hypertension Unknown   . Bladder Cancer Neg Hx   . Kidney cancer Neg Hx    indicated that her mother is deceased. She indicated that her father is deceased. She indicated that the status of her neg hx is unknown.   Social History    Social History   Socioeconomic History  . Marital status: Single    Spouse name: Not on file  . Number of children: Not on file  . Years of education: Not on file  .  Highest education level: Not on file  Occupational History  . Not on file  Social Needs  . Financial resource strain: Not on file  . Food insecurity:    Worry: Not on file    Inability: Not on file  . Transportation needs:    Medical: Not on file    Non-medical: Not on file  Tobacco Use  . Smoking status: Former Smoker    Types: Cigarettes  . Smokeless tobacco: Never Used  Substance and Sexual Activity  . Alcohol use: No  . Drug use: No  . Sexual activity: Not Currently  Lifestyle  . Physical activity:    Days per week: Not on file    Minutes per session: Not on file  . Stress: Not on file  Relationships  . Social connections:    Talks on phone: Not on file    Gets together: Not on file    Attends religious service: Not on file    Active member of club or organization: Not on file    Attends meetings of clubs or organizations: Not on file    Relationship status: Not on file  . Intimate partner violence:    Fear of current or ex partner: Not on file    Emotionally abused: Not on file    Physically abused: Not on file    Forced sexual activity: Not on file  Other Topics Concern  . Not on file  Social  History Narrative  . Not on file     Review of Systems    General: Patient is very sleepy this morning.  Her daughter is present and translates for her.  Generalized malaise, weakness, and sleepiness.  No chills, fever, night sweats or weight changes.  Cardiovascular:  +++  intermittent chest pa and chest wall tenderness in, +++ dyspnea on exertion, no edema, orthopnea, palpitations, paroxysmal nocturnal dyspnea. Dermatological: No rash, lesions/masses Respiratory: No cough, +++ dyspnea Urologic: No hematuria, dysuria Abdominal:   Diffuse abdominal tenderness.  No nausea, vomiting, diarrhea, bright red blood per rectum, melena, or hematemesis Neurologic:  No visual changes, wkns, changes in mental status.  +++ Headaches. All other systems reviewed and are otherwise negative except as noted above.  Physical Exam    Blood pressure (!) 134/27, pulse 66, temperature 97.8 F (36.6 C), resp. rate 17, height 5' (1.524 m), weight 144 lb 14.4 oz (65.7 kg), SpO2 100 %.  General: Pleasant, NAD.  Dozes off frequently. Psych:  Flat affect. Neuro: Alert and oriented X 3. Moves all extremities spontaneously. HEENT: Normal  Neck: Supple without bruits or JVD. Lungs:  Resp regular and unlabored, bibasilar crackles. Heart: Irregular.  No s3, s4, or murmurs.  Chest wall is tender. Abdomen: Soft, non-distended, BS + x 4.  diffusely tender.   Extremities: No clubbing, cyanosis or edema. DP/PT/Radials 2+ and equal bilaterally.  Labs     Recent Labs    06/19/17 2026 06/20/17 0205 06/20/17 0811  TROPONINI 0.09* 0.10* 0.09*   Lab Results  Component Value Date   WBC 10.3 06/20/2017   HGB 9.4 (L) 06/20/2017   HCT 29.1 (L) 06/20/2017   MCV 98.2 06/20/2017   PLT 114 (L) 06/20/2017    Recent Labs  Lab 06/15/17 1357  06/20/17 0205  NA 135   < > 134*  K 3.7   < > 4.4  CL 105   < > 103  CO2 23   < > 25  BUN 42*   < > 37*  CREATININE  1.05*   < > 1.03*  CALCIUM 8.1*   < > 8.3*  PROT 7.0  --    --   BILITOT 0.7  --   --   ALKPHOS 115  --   --   ALT 15  --   --   AST 31  --   --   GLUCOSE 166*   < > 133*   < > = values in this interval not displayed.   Lab Results  Component Value Date   CHOL 139 10/19/2015   HDL 48 10/19/2015   LDLCALC 79 10/19/2015   TRIG 62 10/19/2015    Radiology Studies    Dg Chest 2 View  Result Date: 06/19/2017 CLINICAL DATA:  84 year old female with headache and shortness of breath for 3 days. EXAM: CHEST - 2 VIEW COMPARISON:  Portable chest 06/15/2017 and earlier. FINDINGS: Prior CABG. Prior CABG and cardiac valve replacement. Stable cardiac size and mediastinal contours. Stable lung volumes. Chronic pulmonary interstitial opacity. No pneumothorax or definite pleural effusion. No acute pulmonary opacity identified. Stable visualized osseous structures. Chronic deformity of the proximal left humerus and right humerus arthroplasty. Negative visible bowel gas pattern. IMPRESSION: Stable.  No acute cardiopulmonary abnormality. Electronically Signed   By: Genevie Ann M.D.   On: 06/19/2017 20:29   Dg Chest 2 View  Result Date: 05/23/2017 CLINICAL DATA:  Found on floor, unable to get up on her own, history of stroke with LEFT-sided weakness, dementia, former smoker EXAM: CHEST - 2 VIEW COMPARISON:  03/14/2017 FINDINGS: Upper normal heart size post CABG and AVR. Atherosclerotic calcification aorta. Peribronchial thickening with diffuse interstitial changes likely representing pulmonary edema, little changed. Slight decrease in lung volumes since previous exam. No pleural effusion or pneumothorax. Marked osseous demineralization with nonunion of an old proximal LEFT humeral fracture and prior RIGHT shoulder replacement. IMPRESSION: Diffuse interstitial infiltrates likely representing pulmonary edema, little changed. Electronically Signed   By: Lavonia Dana M.D.   On: 05/23/2017 08:05   Ct Head Wo Contrast  Result Date: 05/23/2017 CLINICAL DATA:  Head trauma with  ataxia.  Initial encounter. EXAM: CT HEAD WITHOUT CONTRAST TECHNIQUE: Contiguous axial images were obtained from the base of the skull through the vertex without intravenous contrast. COMPARISON:  10/20/2015 FINDINGS: Brain: No evidence of acute infarction, hemorrhage, hydrocephalus, extra-axial collection or mass lesion/mass effect. Left frontal cortically based calcification, chronic and incidental. Age normal brain volume and white matter appearance Vascular: No hyperdense vessel or unexpected calcification. Skull: Normal. Negative for fracture or focal lesion. Sinuses/Orbits: No acute finding. IMPRESSION: No evidence of injury.  Age normal head CT. Electronically Signed   By: Monte Fantasia M.D.   On: 05/23/2017 07:58   Mr Brain Wo Contrast  Result Date: 05/23/2017 CLINICAL DATA:  Altered level of consciousness, unexplained EXAM: MRI HEAD WITHOUT CONTRAST TECHNIQUE: Multiplanar, multiecho pulse sequences of the brain and surrounding structures were obtained without intravenous contrast. COMPARISON:  Head CT from earlier today FINDINGS: Severely motion degraded sagittal T1, diffusion, axial T2, axial FLAIR was obtained. No restricted diffusion seen on both axial and coronal scans. Major flow voids are preserved. There is no hydrocephalus or midline shift. The study was terminated early due to concerns for patient's safety. IMPRESSION: 1. No acute finding. 2. Partial and severely motion degraded study due to altered mental status. Electronically Signed   By: Monte Fantasia M.D.   On: 05/23/2017 15:44   Dg Pelvis Portable  Result Date: 05/25/2017 CLINICAL DATA:  Pelvic pain EXAM:  PORTABLE PELVIS 1-2 VIEWS COMPARISON:  CT 10/19/2015 FINDINGS: Chronic degenerative disc disease L3-4 with mild flattening of the L4 and L5 vertebral body similar to prior CT. Osteoarthritis of the sacroiliac joints with sclerosis. No acute pelvic fracture. Slight joint space narrowing of both hips without fracture or  malalignment. Fine bony detail somewhat limited due to the patient's overlying pannus. IMPRESSION: Chronic degenerative disc disease L3-4 with slight chronic vertebral flattening of L4 on L5. Mild osteoarthritic sclerosis of the SI joints. No acute pelvic or proximal femoral fracture. Electronically Signed   By: Ashley Royalty M.D.   On: 05/25/2017 20:01   Dg Chest Portable 1 View  Result Date: 06/15/2017 CLINICAL DATA:  Altered mental status, former smoker, asthma, pulmonary fibrosis, hypertension, coronary artery disease EXAM: PORTABLE CHEST 1 VIEW COMPARISON:  Portable exam 1400 hours compared to 05/23/2017 FINDINGS: Normal heart size, mediastinal contours, and pulmonary vascularity. Postsurgical changes of median sternotomy. Chronic interstitial changes which have been present since an earlier study of 03/02/2015 question chronic interstitial lung disease. Minimal central peribronchial thickening. No definite superimposed acute infiltrate, pleural effusion or pneumothorax. Bones demineralized with note of a RIGHT shoulder prosthesis and chronic proximal LEFT humeral deformity post fracture. IMPRESSION: Bronchitic and chronic interstitial lung disease changes. No acute abnormalities. Electronically Signed   By: Lavonia Dana M.D.   On: 06/15/2017 14:16    ECG & Cardiac Imaging    Sinus rhythm, 62, right axis deviation, right bundle branch block, Mobitz 1  Assessment & Plan    1.  Bradycardia/Mobitz 1/2-1 heart block: Patient admitted with intermittent headache, chest pain, and ongoing fatigue and weakness.  She was recently hospitalized in the setting of group B Streptococcus bacteremia and seizure and subsequently discharged to [facility] on May 27.  She was just discharged home from [facility] on June 17.  Daughter has noted fatigue with intermittent complaints of chest pain, dyspnea, and headache.  She also noted low heart rates both at [facility] and on her home cuff yesterday.  This is  what prompted her to call EMS.  In the emergency department, she was noted to be in Mobitz 1.  On telemetry review from overnight, she has intermittent 2-1 heart block but was not clear if this represents Mobitz 1 or Mobitz 2.  She has not had any prolonged pauses or reports of syncope.  She has been hemodynamically stable throughout.  Though she was discharged home on low-dose beta-blocker in May, her daughter reports that she never received any of this as her blood pressure has been stable.  Lab work otherwise unrevealing with stable potassium, magnesium, and normal TSH in May.  Continue to hold any AV nodal blocking agents.  Follow on telemetry.  No current indication for pacing.  When appropriate, would like to see her get up and walk around some with physical therapy to assess her chronotropic response to activity.  2.  Chest pain/coronary artery disease/troponin elevation: Patient with known prior history of CAD status post CABG x2 with a LIMA to the LAD and vein graft to the first diagonal, and 2009.  Patient's daughter indicates the patient has been complaining of intermittent chest pain dating back to at least 2010 but it seems as though her complaints are more frequent.  Chest pain is often preceded by headache and is also associated with dyspnea.  This has been occurring multiple times per week and is typically treated with Tylenol or ibuprofen with relief of symptoms in about 2 hours.  She has had mild troponin elevations on each of her last 2 admissions with echo was performed in March and again in May, both times showing normal LV function normally functioning bioprosthetic valve.  Troponin on this admission was 0.09  0.10  0.09.  She did not initially have chest pain this morning when we met but does have chest wall tenderness.  Recommend continuation of low-dose aspirin therapy.  No beta-blocker in the setting of bradycardia.  With advanced age and comorbidities, would not pursue additional  ischemic evaluation at this time.  We will add low-dose long-acting nitrate though this may be limited by headaches.  3.  Essential hypertension: Stable.  4.  Recent bacteremia/UTI: Currently both on Levaquin and amoxicillin.  Amoxicillin is a hold over from her bacteremia with a plan for 6 weeks of coverage.    5.  History of aortic stenosis status post bioprosthetic aortic valve: Stable functioning valve by recent echoes.  6.  Type 2 diabetes mellitus: Sliding scale insulin per medicine.  7.  Normocytic anemia: Stable.   Signed, Murray Hodgkins, NP 06/20/2017, 11:01 AM  For questions or updates, please contact   Please consult [email or web address] for contact info under Cardiology/STEMI.

## 2017-06-20 NOTE — ED Notes (Signed)
Report called to lexi rn floor nurse 

## 2017-06-20 NOTE — Progress Notes (Signed)
Per MD verbal orders to discontinue blood sugar checks. Patient does not take anything at home for Diabetes. Will continue to monitor patient.

## 2017-06-21 ENCOUNTER — Telehealth: Payer: Self-pay | Admitting: Cardiovascular Disease

## 2017-06-21 DIAGNOSIS — E782 Mixed hyperlipidemia: Secondary | ICD-10-CM

## 2017-06-21 DIAGNOSIS — I208 Other forms of angina pectoris: Secondary | ICD-10-CM

## 2017-06-21 DIAGNOSIS — E119 Type 2 diabetes mellitus without complications: Secondary | ICD-10-CM

## 2017-06-21 DIAGNOSIS — I495 Sick sinus syndrome: Secondary | ICD-10-CM

## 2017-06-21 MED ORDER — ISOSORBIDE MONONITRATE ER 30 MG PO TB24
15.0000 mg | ORAL_TABLET | Freq: Every day | ORAL | Status: DC
  Administered 2017-06-21: 15 mg via ORAL
  Filled 2017-06-21: qty 1

## 2017-06-21 MED ORDER — IPRATROPIUM-ALBUTEROL 0.5-2.5 (3) MG/3ML IN SOLN: 3.0000 mL | RESPIRATORY_TRACT | Status: DC | PRN

## 2017-06-21 MED ORDER — POLYETHYLENE GLYCOL 3350 17 G PO PACK
17.0000 g | PACK | Freq: Every day | ORAL | Status: DC
  Administered 2017-06-21: 17 g via ORAL
  Filled 2017-06-21: qty 1

## 2017-06-21 MED ORDER — ALBUTEROL SULFATE (2.5 MG/3ML) 0.083% IN NEBU: 2.5000 mg | INHALATION_SOLUTION | Freq: Four times a day (QID) | RESPIRATORY_TRACT | 12 refills | Status: AC | PRN

## 2017-06-21 MED ORDER — ISOSORBIDE MONONITRATE ER 30 MG PO TB24: 15.0000 mg | ORAL_TABLET | Freq: Every day | ORAL | 0 refills | Status: AC

## 2017-06-21 NOTE — Progress Notes (Signed)
Patient given discharge instructions with daughter at bedside. Daughter verbalized understanding with no further questions. Discharge packet given. Patient to transport home via EMS. DNR form given to EMS. Patient in stable condition upon discharge.

## 2017-06-21 NOTE — Progress Notes (Signed)
Progress Note  Patient Name: Ariana Miller Date of Encounter: 06/21/2017  Primary Cardiologist: Ida Rogue, MD  Subjective   Pt a little more alert this AM.  Was getting a bath prior to my arrival.  Dtr @ bedside. Pt cont to c/o chest wall tenderness and headache.  No dyspnea.  Ongoing Mobitz I and 2:1 heart block overnight.  Hemodynamically stable.  Inpatient Medications    Scheduled Meds: . amoxicillin  500 mg Oral Q8H  . aspirin  81 mg Oral Daily  . enoxaparin (LOVENOX) injection  30 mg Subcutaneous Q24H  . levofloxacin  375 mg Oral Daily   Continuous Infusions:  PRN Meds: acetaminophen **OR** acetaminophen, ondansetron **OR** ondansetron (ZOFRAN) IV, risperiDONE   Vital Signs    Vitals:   06/20/17 1808 06/20/17 2019 06/21/17 0609 06/21/17 0814  BP: (!) 129/52 (!) 138/32 (!) 145/48 (!) 141/37  Pulse: (!) 41 (!) 42 (!) 46 (!) 47  Resp: 18  18   Temp:  97.9 F (36.6 C) 98.7 F (37.1 C) 98.8 F (37.1 C)  TempSrc:  Oral Oral Oral  SpO2: 100% 100% 94% 100%  Weight:      Height:        Intake/Output Summary (Last 24 hours) at 06/21/2017 0830 Last data filed at 06/20/2017 2021 Gross per 24 hour  Intake 375 ml  Output 600 ml  Net -225 ml   Filed Weights   06/19/17 2010 06/20/17 0053  Weight: 140 lb (63.5 kg) 144 lb 14.4 oz (65.7 kg)    Physical Exam   GEN: Well nourished, well developed, in no acute distress.  HEENT: Grossly normal.  Neck: Supple, no JVD, carotid bruits, or masses. Cardiac: Irreg, 2/6 SEM RUSB and @ apex.  No rubs, or gallops. No clubbing, cyanosis, edema.  Radials/DP/PT 1+ and equal bilaterally. Chest wall is diffusely tender. Respiratory:  Respirations regular and unlabored, bibasilar crackles - poor effort. GI: Soft, nontender, nondistended, BS + x 4. MS: no deformity or atrophy. Skin: warm and dry, no rash. Neuro:  Strength and sensation are intact. Psych: AAOx3.  Normal affect.  Labs    Chemistry Recent Labs  Lab  06/15/17 1357 06/19/17 2026 06/20/17 0205  NA 135 134* 134*  K 3.7 4.8 4.4  CL 105 103 103  CO2 23 25 25   GLUCOSE 166* 155* 133*  BUN 42* 38* 37*  CREATININE 1.05* 1.02* 1.03*  CALCIUM 8.1* 8.2* 8.3*  PROT 7.0  --   --   ALBUMIN 2.4*  --   --   AST 31  --   --   ALT 15  --   --   ALKPHOS 115  --   --   BILITOT 0.7  --   --   GFRNONAA 45* 46* 46*  GFRAA 52* 54* 53*  ANIONGAP 7 6 6      Hematology Recent Labs  Lab 06/15/17 1357 06/19/17 2026 06/20/17 0205  WBC 10.8 10.4 10.3  RBC 2.99* 2.99* 2.97*  HGB 9.6* 9.6* 9.4*  HCT 29.1* 29.1* 29.1*  MCV 97.5 97.6 98.2  MCH 32.0 32.1 31.7  MCHC 32.8 32.9 32.3  RDW 21.8* 21.9* 22.1*  PLT 95* 103* 114*    Cardiac Enzymes Recent Labs  Lab 06/19/17 2026 06/20/17 0205 06/20/17 0811 06/20/17 1544  TROPONINI 0.09* 0.10* 0.09* 0.13*      Radiology    Dg Chest 2 View  Result Date: 06/19/2017 CLINICAL DATA:  84 year old female with headache and shortness of breath for 3  days. EXAM: CHEST - 2 VIEW COMPARISON:  Portable chest 06/15/2017 and earlier. FINDINGS: Prior CABG. Prior CABG and cardiac valve replacement. Stable cardiac size and mediastinal contours. Stable lung volumes. Chronic pulmonary interstitial opacity. No pneumothorax or definite pleural effusion. No acute pulmonary opacity identified. Stable visualized osseous structures. Chronic deformity of the proximal left humerus and right humerus arthroplasty. Negative visible bowel gas pattern. IMPRESSION: Stable.  No acute cardiopulmonary abnormality. Electronically Signed   By: Genevie Ann M.D.   On: 06/19/2017 20:29    Telemetry    Sinus brady in the low 40's with intermittent 2:1 HB and wenckebach  - Personally Reviewed  Cardiac Studies   2D Echocardiogram 6.18.2019  Study Conclusions   - Left ventricle: The cavity size was normal. Wall thickness was   increased in a pattern of moderate LVH. Systolic function was   vigorous. The estimated ejection fraction was in the  range of 65%   to 70%. - Aortic valve: A bioprosthesis was present. Peak velocity (S): 243   cm/s. Mean gradient (S): 13 mm Hg. - Mitral valve: Calcified annulus. Severely thickened leaflets .   There is a faint mobile echodensity adjacent to the left atrial   side of the posterior leaflet. Cannot exclude vegetation.   Transvalvular velocity was increased. The findings are consistent   with moderate stenosis. There was mild to moderate regurgitation.   Mean gradient (D): 8 mm Hg. Valve area by pressure half-time: 2.5   cm^2. Valve area by continuity equation (using LVOT flow): 1.44   cm^2. - Left atrium: The atrium was moderately dilated. - Tricuspid valve: There was mild-moderate regurgitation. _____________   Patient Profile     Ariana Miller is a 84 y.o. female with a history of CAD s/p CABG x 2 (LIMA  LAD, VG  D1), Bioprosthetic AVR, HTN, HL, DMII, anemia, OA, bladder cancer s/p TURBT, asthma, IDA, dementia, pulm fibrosis, carotid dzs s/p L CEA, recent bacteremia, and UTI, who was admitted 6/17 w/ elevated troponin, bradycardia/Mobitz I, and chest pain.  Assessment & Plan    1.  Bradycardia/Mobtiz1 and 2:1 heart block: Pt admitted with fatigue, chest pain, dyspnea, and bradycardia.  Not on any AVN blocking agents.  HR's persistently in 40's over past 24 hrs with 2:1 HB and intermittent wenckebach.  Despite bradycardia, she has been hemodynamically stable. Echo w/ nl EF and nl fxn'ing AoV. Ideally would like to assess for chronotropic response, though she has been more or less bed ridden for several weeks.  Will need PT.  With hemodynamic stability, recent bacteremia and concern for possible veg on MV on echo, and lack of higher grade HB, would rec conservative rx for the time being.  If she develops higher grade HB or hemodynamic instability, she may be treated w/ atropine we will need to reconsider options.  2.  Chest pain/CAD/troponin elevation: s/p prior CABG x 2 w/ LIMA  LAD and VG   Diag.  She has been having intermittent c/p since CABG per dtr, though this has increased in frequency recently.  She had elevated troponins during last admission w/ bacteremia.  Trop  0.09  0.09  0.10  0.13 this admission.  Echo yesterday showed nl EF.  She cont to have chest wall pain.  Not clear what role potential demand ischemia might be playing in bradycardia.  Rec conservative Rx  ASA.  3.  Essential HTN: BP up this AM.  Antihypertensives have been on hold. In setting of elevated troponin and  c/p, will add long-acting nitrate.  If worsens headaches would have to d/c.  4.  Recent Bacteremia/UTI: Echo w/ faint mobile echodensity adjacent to the left atrial side of the posterior leaflet  ? Vegetation.  Repeat blood cultures drawn.  Abx per IM.  With frailty, would avoid TEE.  5.  H/o AS s/p bioprosthetic AVR: Normally fxn'ing valve on echo. Mean gradient 13.  6.  DMII:  SSI per IM.  7.  Normocytic anemia:  Stable.  Signed, Murray Hodgkins, NP  06/21/2017, 8:30 AM    For questions or updates, please contact   Please consult [email or web address] for contact info under Cardiology/STEMI.

## 2017-06-21 NOTE — Progress Notes (Signed)
Daily Progress Note   Patient Name: Ariana Miller       Date: 06/21/2017 DOB: 1923-02-28  Age: 84 y.o. MRN#: [id number] Attending Physician: Hillary Bow, MD Primary Care Physician: Rusty Aus, MD Admit Date: 06/19/2017  Reason for Consultation/Follow-up: Establishing goals of care  Subjective: Patient more alert this morning, eating a magic cup, smiles at me  Length of Stay: 2  Current Medications: Scheduled Meds:  . amoxicillin  500 mg Oral Q8H  . aspirin  81 mg Oral Daily  . enoxaparin (LOVENOX) injection  30 mg Subcutaneous Q24H  . isosorbide mononitrate  15 mg Oral Daily  . levofloxacin  375 mg Oral Daily    Continuous Infusions:   PRN Meds: acetaminophen **OR** acetaminophen, ondansetron **OR** ondansetron (ZOFRAN) IV, risperiDONE  Physical Exam  Constitutional: She appears alert. She is cooperative. No distress.  HENT:  Head: Normocephalic and atraumatic.  Cardiovascular: RRR during my assessment - tele shows rate in 60s.  Pulmonary/Chest: Effort normal. Clear anteriorly to auscultation.  Abdominal: Soft. Bowel sounds are normal.  Musculoskeletal:       Right lower leg: She exhibits no edema.       Left lower leg: She exhibits no edema.  Neurological: She appears alert. She is disoriented.  Skin: Skin is warm and dry.  Psychiatric: Cognition and memory are impaired.  Vital Signs: BP (!) 141/37 (BP Location: Left Arm)   Pulse (!) 47   Temp 98.8 F (37.1 C) (Oral)   Resp 18   Ht 5' (1.524 m)   Wt 65.7 kg (144 lb 14.4 oz)   SpO2 100%   BMI 28.30 kg/m  SpO2: SpO2: 100 % O2 Device: O2 Device: Room Air O2 Flow Rate: O2 Flow Rate (L/min): 1 L/min  Intake/output summary:   Intake/Output Summary (Last 24 hours) at 06/21/2017 1012 Last data filed at 06/20/2017  2021 Gross per 24 hour  Intake 375 ml  Output 600 ml  Net -225 ml   LBM: Last BM Date: 06/17/17 Baseline Weight: Weight: 63.5 kg (140 lb) Most recent weight: Weight: 65.7 kg (144 lb 14.4 oz)       Palliative Assessment/Data: PPS 30%   Flowsheet Rows     Most Recent Value  Intake Tab  Referral Department  Hospitalist  Unit at Time of Referral  Cardiac/Telemetry Unit  Palliative Care Primary Diagnosis  Cardiac  Date Notified  06/20/17  Palliative Care Type  New Palliative care  Reason for referral  Clarify Goals of Care  Date of Admission  06/19/17  Date first seen by Palliative Care  06/20/17  # of days Palliative referral response time  0 Day(s)  # of days IP prior to Palliative referral  1  Clinical Assessment  Palliative Performance Scale Score  30%  Psychosocial & Spiritual Assessment  Palliative Care Outcomes  Patient/Family meeting held?  Yes  Who was at the meeting?  patient and daughter  Palliative Care Outcomes  Clarified goals of care, Provided psychosocial or spiritual support, Counseled regarding hospice      Patient Active Problem List   Diagnosis Date Noted  . Goals of care, counseling/discussion   . Palliative care by specialist   . Adult  failure to thrive   . Chest pain 06/19/2017  . Mobitz type II block 06/19/2017  . Sepsis ([place]) 05/23/2017  . Dyspnea 03/15/2017  . Hypokalemia 03/15/2017  . Benign essential hypertension 10/30/2015  . Rheumatic disease of heart valve 10/27/2015  . Weakness 10/19/2015  . Pressure injury of skin 10/19/2015  . Interstitial pulmonary fibrosis ([place]) 03/12/2015  . Reactive airway disease that is not asthma 03/02/2015  . Hyponatremia 03/02/2015  . Diabetes mellitus type 2, controlled, without complications ([place]) 76/16/0737  . Accelerated hypertension 03/02/2015  . CAD (coronary artery disease) 03/02/2015  . Hyperlipidemia, mixed 02/26/2015    Palliative Care Assessment & Plan   HPI: 84 y.o. female  with past  medical history of CAD s/p CABG, bioprosthetic AVR (2009), HTN, HLD, carotid artery stenosis, T2DM, anemia, bladder cancer, asthma, pulmonary fibrosis, OA, and recent group B strep bacteremia admitted on 06/19/2017 with headache and chest pain. Patient found to be bradycardic and second degree AV block. Troponin 0.1. Patient was recently at [facility] for rehab and was d/c'd home 6/17 - was admitted to the hospital same day. Per cardiology, patient needs cardiac cath but d/t comorbidities this not a good option. Also not a candidate for a pacemaker. PMT consulted d/t declining function and to establish goals of care.  Assessment: Follow up with patient and her daughter, Ariana Miller, this am. Ariana Miller is hopeful her mother is beginning to improve. She is pleased that she is a bit more alert and her HR is a bit higher.   Time spent engaging in life review. She shared about patient's family.   Today I had a discussion with the daughter about how to proceed if patient acutely decompensated. We thoroughly discussed different scenarios. The daughter spoke with her mother regarding this and feels that if she decompensated we should transition our focus to comfort and not pursue aggressive medical interventions. We also discussed how to proceed if the state we see her mother in is her new baseline. She is concerned about limited function. We discussed high chance of readmission and if she would want her mother to return to the hospital. She tells me that she does not think she would want her to return. We discussed palliative care involvement outpatient and transitioning to hospice when she was ready/as her mother declined.  She has concerns about hospice care - continues to mention her fears of morphine. I discussed these concerns with her and tried to clarify misconceptions.   Recommendations/Plan:  Family requests PT - has been ordered by Dr. Darvin Neighbours  Family requests note about hospitalization to receive refund from  airline - provided by me  Outpatient palliative care at home with home health  If patient acutely decompensates, daughter would like to focus on comfort and not pursue aggressive medical interventions  Main goal is for patient to return home - not interested in rehab  PMT will follow  Code Status:  DNR  Prognosis:   Unable to determine - poor prognosis r/t multiple comorbidities, poor functional status, and recurrent infections  Discharge Planning:  Home with Palliative Services and home health  Care plan was discussed with Dr. Darvin Neighbours  Thank you for allowing the Palliative Medicine Team to assist in the care of this patient.   Time In: 1000 Time Out: 1145 Total Time 105 minutes Prolonged Time Billed  yes       Greater than 50%  of this time was spent counseling and coordinating care related to the above assessment and plan.  Ariana Burrow, DNP, [facility] Palliative Medicine Team Team Phone # [phone]  Pager [phone]

## 2017-06-21 NOTE — Evaluation (Signed)
Physical Therapy Evaluation Patient Details Name: Ariana Miller MRN: [id number] DOB: [birth year] Today's Date: 06/21/2017   History of Present Illness  Pt is a 84 y.o. female presenting to hospital 06/19/17 (same day pt discharged from STR at [facility]); pt with coughing, SOB, and intermittent HA's; recent hospital admission for bacteremia.  Pt admitted with chest pain and Mobitz type II block (HR 40-50's).  PMH includes asthma, CAD s/p CABG, DM, CHF, htn, pulmonary fibrosis, and aortic stenosis.  Clinical Impression  Prior to this hospital admission, pt was hoyer lift dependent with transfers (but was ambulatory about 1 month ago).  Pt lives with family in 1 level home with ramp to enter.  Pt appearing pleasantly confused during session.  Currently pt is max assist with bed mobility; SBA with sitting balance; and 2 assist to stand for a few seconds with youth RW.  Per pt's daughter's description, pt appears to be at similar functional level as prior to this hospital admission (was recently at [facility] and discharged home with hoyer lift).  Pt would benefit from skilled PT to address noted impairments and functional limitations (see below for any additional details).  Upon hospital discharge, recommend pt discharge to home with 24/7 assist and use of hoyer lift for transfers (pt has hospital bed, hoyer lift with pad, and BSC at home per pt's daughter); also recommend HHPT.    Follow Up Recommendations Home health PT;Supervision/Assistance - 24 hour    Equipment Recommendations  Wheelchair (measurements PT);Wheelchair cushion (measurements PT);Hospital bed    Recommendations for Other Services       Precautions / Restrictions Precautions Precautions: Fall Restrictions Weight Bearing Restrictions: No      Mobility  Bed Mobility Overal bed mobility: Needs Assistance Bed Mobility: Supine to Sit;Sit to Supine Rolling: Max assist   Supine to sit: Max assist     General bed mobility  comments: pt requiring assist for trunk and B LE's (pt attempted to perform but requiring significant assist)  Transfers Overall transfer level: Needs assistance Equipment used: Rolling walker (2 wheeled) Transfers: Sit to/from Stand Sit to Stand: Max assist;+2 physical assistance         General transfer comment: pt only able to stand for a few seconds with 2 assist and youth RW and then L LE sliding forward and pt needing to sit again  Ambulation/Gait             General Gait Details: deferred d/t pt unable to maintain standing with 2 assist  Stairs            Wheelchair Mobility    Modified Rankin (Stroke Patients Only)       Balance Overall balance assessment: Needs assistance Sitting-balance support: Bilateral upper extremity supported Sitting balance-Leahy Scale: Poor Sitting balance - Comments: pt requires B UE support to maintain sitting balance   Standing balance support: Bilateral upper extremity supported Standing balance-Leahy Scale: Poor Standing balance comment: 2 assist to maintain standing with youth RW                             Pertinent Vitals/Pain Pain Assessment: No/denies pain  HR 45-52 bpm during session (nursing aware).    Home Living Family/patient expects to be discharged to:: Private residence Living Arrangements: Children Available Help at Discharge: Family;Available 24 hours/day Type of Home: House Home Access: Ramped entrance     Home Layout: One level Home Equipment: Hospital bed;Wheelchair - manual;Bedside commode;Cane -  single point;Walker - 2 wheels Additional Comments: Hoyer lift with pad    Prior Function Level of Independence: Needs assistance   Gait / Transfers Assistance Needed: Pt was ambulatory beginning of May 2019 using [facility].  Pt discharged from [facility] 06/19/17 and pt using hoyer lift (pt's daughter reporting pt could only stand for a few seconds with assist at [facility]).           Hand Dominance         Extremity/Trunk Assessment   Upper Extremity Assessment Upper Extremity Assessment: (very limited L UE movement; <70 degree AROM R UE)    Lower Extremity Assessment Lower Extremity Assessment: Generalized weakness       Communication   Communication: Prefers language other than [place];Interpreter utilized(Spanish interpreter Alean Rinne present entire session)  Cognition Arousal/Alertness: Awake/alert Behavior During Therapy: WFL for tasks assessed/performed Overall Cognitive Status: History of cognitive impairments - at baseline(Oriented to name and month/day of birthday (pt did not know year of birthday).)                                 General Comments: Pt reported she was 84 years old and that she was currently at home; did not know current month.  Pt reporting she was ambulatory prior to this hospital admission (pt's daughter came during session and reporting pt was not ambulatory--pt was hoyer lift status prior to this hospital admission)      General Comments   Nursing cleared pt for participation in physical therapy.  Pt agreeable to PT session and requesting to get OOB.  Pt's daughter came during therapy session.    Exercises     Assessment/Plan    PT Assessment Patient needs continued PT services  PT Problem List Decreased strength;Decreased balance;Decreased mobility;Decreased knowledge of use of DME       PT Treatment Interventions DME instruction;Gait training;Functional mobility training;Therapeutic activities;Balance training;Therapeutic exercise;Patient/family education    PT Goals (Current goals can be found in the Care Plan section)  Acute Rehab PT Goals Patient Stated Goal: to be able to walk again PT Goal Formulation: With patient/family Time For Goal Achievement: 07/05/17 Potential to Achieve Goals: Fair    Frequency Min 2X/week   Barriers to discharge        Co-evaluation               AM-PAC PT "6 Clicks"  Daily Activity  Outcome Measure Difficulty turning over in bed (including adjusting bedclothes, sheets and blankets)?: Unable Difficulty moving from lying on back to sitting on the side of the bed? : Unable Difficulty sitting down on and standing up from a chair with arms (e.g., wheelchair, bedside commode, etc,.)?: Unable Help needed moving to and from a bed to chair (including a wheelchair)?: Total Help needed walking in hospital room?: Total Help needed climbing 3-5 steps with a railing? : Total 6 Click Score: 6    End of Session Equipment Utilized During Treatment: Gait belt Activity Tolerance: Patient tolerated treatment well Patient left: in bed;with call bell/phone within reach;with bed alarm set;with family/visitor present;Other (comment) Nurse Communication: Mobility status PT Visit Diagnosis: Other abnormalities of gait and mobility (R26.89);Muscle weakness (generalized) (M62.81);History of falling (Z91.81);Difficulty in walking, not elsewhere classified (R26.2)    Time: 9163-8466 PT Time Calculation (min) (ACUTE ONLY): 21 min   Charges:   PT Evaluation $PT Eval Low Complexity: 1 Low     PT G Codes:  Leitha Bleak, PT 06/21/17, 5:36 PM [phone]

## 2017-06-21 NOTE — Care Management Note (Signed)
Case Management Note  Patient Details  Name: Ariana Miller MRN: [id number] Date of Birth: [birth year]  Subjective/Objective:  Spoke with daughter regarding discharge plan. Patient was already set up to begin services with [facility] prior to admission. She would like to continue with [facility]. Also discuss palliative care services with [facility]. She is agreeable.  Referral to [facility].  She has met and discussed this with the palliative care team. Will need a nebulizer. Ordered from [place] with [facility]. Patient will need EMS transport home. Medical necessity completed.                    Action/Plan:  [facility] for RN, PT, OT, HHA, SW. Palliative care services with [facility]  Expected Discharge Date:  06/21/17               Expected Discharge Plan:  [place]  In-House Referral:     Discharge planning Services  CM Consult  Post Acute Care Choice:  Durable Medical Equipment, Home Health Choice offered to:  Adult Children  DME Arranged:  Chiropodist DME Agency:  [place] Arranged:  RN, PT, OT, Nurse's Aide, Social Work [employer] Agency:  [place]  Status of Service:  Completed, signed off  If discussed at [employer], dates discussed:    Additional Comments:  Jolly Mango, RN 06/21/2017, 1:28 PM

## 2017-06-21 NOTE — Progress Notes (Signed)
New referral for outpatient palliative to follow at home received from [facility]. Patient to discharge home today with [facility]. Patient information faxed to referral.  Flo Shanks RN, BSN, [facility] [place], hospital liaison [phone]

## 2017-06-21 NOTE — Progress Notes (Signed)
  Speech Language Pathology Treatment: Dysphagia  Patient Details Name: Ariana Miller MRN: [id number] DOB: May 04, 1923 Today's Date: 06/21/2017 Time: 8144-8185 SLP Time Calculation (min) (ACUTE ONLY): 24 min  Assessment / Plan / Recommendation Clinical Impression  SLP present for noon meal to assess pt for safety with Dysphagia 1 with thin liquid diet. pt intake was less than 25% with verbal cues and encouragement. Pt daughter present for session. SLP provided education on how to puree foods and add moisture to foods to increase safety. Pt daughter has concerns for amount of sodium and sugar in foods that are puree. slp educated and advised to choose lower sodium foods that are a substitute for higher sodium foods. slp educated on foods that are naturally puree and how to utilize kitchen equipment for puree food textures. Daughter agreed and stated understanding but slp guarded on understanding for safety.   daughter states pt coughs with meals, however upon st entry pt was sitting at 45^ position and slp educated on need to increase positioning to 90^ to decrease aspiration risk. Daughter increased head of bed and stated that at home she keeps her mom in the wheel chair for eating.  SLP discussed with NSG to have order for speech with home health orders upon discharge expected for today.     HPI HPI: Pt is a 84 y.o. female with a known history of multiple medical issues including Dementia chronic anemia, bladder cancer, CAD/CABG, diabetes and chronic diastolic heart failure with preserved ejection fraction who presents to the emergency room due to a fall and generalized weakness. No focal neurological deficits were noted.  She was brought to the ER by her family members, while in the emergency room she had a witnessed 32nd generalized tonic-clonic seizure in the emergency room.  She has never had seizures in her life.  She also was noted to have a temperature of 102. Patient was lethargic after being  given Ativan for her seizure. Currently, more awake w/ verbal cues. Pt had been eating a little lunch w/ Dtr in room. Dtr reported pt eats soft foods at home AND wears her dentures.       SLP Plan  Continue with current plan of care       Recommendations  Diet recommendations: Dysphagia 1 (puree);Thin liquid Liquids provided via: Teaspoon;Cup;Straw Medication Administration: Crushed with puree Compensations: Slow rate;Small sips/bites;Follow solids with liquid Postural Changes and/or Swallow Maneuvers: Out of bed for meals;Seated upright 90 degrees                Oral Care Recommendations: Oral care BID;Staff/trained caregiver to provide oral care Follow up Recommendations: None SLP Visit Diagnosis: Dysphagia, oral phase (R13.11) Plan: Continue with current plan of care       GO                [place] 06/21/2017, 1:17 PM

## 2017-06-21 NOTE — Telephone Encounter (Signed)
TCM [facility] for  elevated troponin, bradycardia/Mobitz I, and chest pain  Needs 1 wk fu   Scheduled next available 7/18 [place] at 220   Added to waitlist

## 2017-06-21 NOTE — Telephone Encounter (Signed)
Patient currently admitted at this time. 

## 2017-06-21 NOTE — Plan of Care (Signed)
  Problem: Education: Goal: Knowledge of General Education information will improve Outcome: Adequate for Discharge   Problem: Health Behavior/Discharge Planning: Goal: Ability to manage health-related needs will improve Outcome: Adequate for Discharge   Problem: Clinical Measurements: Goal: Ability to maintain clinical measurements within normal limits will improve Outcome: Adequate for Discharge Goal: Will remain free from infection Outcome: Adequate for Discharge Goal: Diagnostic test results will improve Outcome: Adequate for Discharge Goal: Respiratory complications will improve Outcome: Adequate for Discharge Goal: Cardiovascular complication will be avoided Outcome: Adequate for Discharge   Problem: Activity: Goal: Risk for activity intolerance will decrease Outcome: Adequate for Discharge   Problem: Pain Managment: Goal: General experience of comfort will improve Outcome: Adequate for Discharge   Problem: Safety: Goal: Ability to remain free from injury will improve Outcome: Adequate for Discharge

## 2017-06-22 NOTE — Telephone Encounter (Signed)
Left voicemail message to call back  

## 2017-06-25 LAB — CULTURE, BLOOD (ROUTINE X 2)
CULTURE: NO GROWTH
CULTURE: NO GROWTH
SPECIAL REQUESTS: ADEQUATE
Special Requests: ADEQUATE

## 2017-06-27 NOTE — Telephone Encounter (Signed)
No answer. Left message to call back if she has any questions on upcoming appointment or with discharge instructions.

## 2017-06-28 NOTE — Discharge Summary (Signed)
[facility] [place] NAME: Ariana Miller    MR#:  [id number]  DATE OF BIRTH:  [birth year]  DATE OF ADMISSION:  06/19/2017 ADMITTING PHYSICIAN: Lance Coon, MD  DATE OF DISCHARGE: 06/21/2017  5:37 PM  PRIMARY CARE PHYSICIAN: Rusty Aus, MD   ADMISSION DIAGNOSIS:  Mobitz type II block [I44.1] Chest pain, unspecified type [R07.9]  DISCHARGE DIAGNOSIS:  Principal Problem:   Chest pain Active Problems:   Diabetes mellitus type 2, controlled, without complications (HCC)   CAD (coronary artery disease)   Benign essential hypertension   Hyperlipidemia, mixed   Mobitz type II block   Goals of care, counseling/discussion   Palliative care by specialist   Adult failure to thrive   SECONDARY DIAGNOSIS:   Past Medical History:  Diagnosis Date  . (HFpEF) heart failure with preserved ejection fraction ([place])    a. 04/2017 Echo: EF 55-60%, Gr2 DD.  Marland Kitchen Anemia   . Arthritis   . Asthma   . Bladder cancer ([place])   . CAD (coronary artery disease)    a. 2-V CABG 03/2007 (LIMA-LAD, SVG-D1)  . Diabetes mellitus with complication ([place])   . History of gastric ulcer   . HTN (hypertension)   . Pulmonary fibrosis ([place])   . Severe aortic stenosis    a. s/p Carpentier-Edwards AVR 03/2007; b. echo 2009 (pre-AVR): nl LVEF, mild LVH, severe AS, mild MS, trivial AR/MR/TR/PR, nl RVSF; c. 05/2017 Echo: EF 55-60%, Gr2 DD, bioprosthetic AoV, mean gradient 64mmHg. Mod MR. Mod dil LA. Nl RV fxn. PASP 110mmHg.     ADMITTING HISTORY  HISTORY OF PRESENT ILLNESS:  Ariana Miller  is a 84 y.o. female who presents with headaches and bradycardia.  Patient was found here to have new onset of Mobitz type II AV block rhythm.  Her troponin is mildly elevated at 0.09.  Per family at bedside her heart rate has been reading low for the past couple of days, but her daughter thought that the blood pressure machine was not reading correctly.  Patient has been having significant  headaches intermittently for the past couple of days.  Tonight the patient developed some chest pain as well.  Hospitalist were called for admission  HOSPITAL COURSE:   Chest pain  chronic and atypical. Patient with a history of CAD will ideally need cardiac catheterization but due to multiple comorbidities this is not being pursued. Seen by cardiology.  After discussing with family cardiology has deferred any invasive work-up. Medical management.  2:1 AV block and Mobitz type 1 second degree AV block appreciate cardiology input. No pacemaker at this time.  Benign essential hypertension -patient's blood pressure is stable at this time, goal is less than 160/100, treat as needed  Hyperlipidemia, mixed -Home dose antilipid meds   Discussed at length with daughter.  Patient is DO NOT RESUSCITATE and DO NOT INTUBATE.  Their wishes are to take patient home and not to nursing home where she has not done well.  She seems to have declined significantly over the last few months.  Daughter has inquired about hospice services.  We are discharging patient home with home health with palliative following.  She will be transition to hospice services and comfort measures if any worsening as outpatient.  Patient discharged home with guarded prognosis.  High risk for deterioration/readmission.   CONSULTS OBTAINED:  Treatment Team:  Nelva Bush, MD  DRUG ALLERGIES:  No Known Allergies  DISCHARGE MEDICATIONS:   Allergies as of 06/21/2017  No Known Allergies     Medication List    STOP taking these medications   amLODipine 5 MG tablet Commonly known as:  NORVASC   metoprolol succinate 25 MG 24 hr tablet Commonly known as:  TOPROL-XL     TAKE these medications   acetaminophen 325 MG tablet Commonly known as:  TYLENOL Take 2 tablets (650 mg total) by mouth every 6 (six) hours as needed for mild pain (or Fever >/= 101).   albuterol (2.5 MG/3ML) 0.083% nebulizer  solution Commonly known as:  PROVENTIL Take 3 mLs (2.5 mg total) by nebulization every 6 (six) hours as needed for wheezing or shortness of breath.   amoxicillin 500 MG capsule Commonly known as:  AMOXIL Take 1 capsule (500 mg total) by mouth every 8 (eight) hours. Treat through July 2nd   docusate sodium 100 MG capsule Commonly known as:  COLACE Take 1 capsule (100 mg total) by mouth 2 (two) times daily.   ferrous sulfate 325 (65 FE) MG tablet Take 325 mg by mouth 2 (two) times daily.   ibuprofen 400 MG tablet Commonly known as:  ADVIL,MOTRIN Take 1 tablet (400 mg total) by mouth every 6 (six) hours as needed for fever, headache, moderate pain or cramping.   isosorbide mononitrate 30 MG 24 hr tablet Commonly known as:  IMDUR Take 0.5 tablets (15 mg total) by mouth daily.   Melatonin 5 MG Tabs Take 1 tablet (5 mg total) by mouth at bedtime.   multivitamin with minerals Tabs tablet Take 1 tablet by mouth daily.   potassium chloride SA 20 MEQ tablet Commonly known as:  K-DUR,KLOR-CON Take 20 mEq by mouth 2 (two) times daily.   risperiDONE 0.5 MG tablet Commonly known as:  RISPERDAL Take 1 tablet (0.5 mg total) by mouth at bedtime as needed (insomnia).   vitamin C 250 MG tablet Commonly known as:  ASCORBIC ACID Take 500 mg by mouth 2 (two) times daily.     ASK your doctor about these medications   levofloxacin 250 MG tablet Commonly known as:  LEVAQUIN Take 1.5 tablets (375 mg total) by mouth daily for 7 days. Ask about: Should I take this medication?       Today   VITAL SIGNS:  Blood pressure (!) 110/97, pulse (!) 44, temperature 97.9 F (36.6 C), temperature source Oral, resp. rate 18, height 5' (1.524 m), weight 65.7 kg (144 lb 14.4 oz), SpO2 97 %.  I/O:  No intake or output data in the 24 hours ending 06/28/17 1102  PHYSICAL EXAMINATION:  Physical Exam  GENERAL:  84 y.o.-year-old patient lying in the bed with no acute distress.  LUNGS: Normal breath  sounds bilaterally, no wheezing, rales,rhonchi or crepitation. No use of accessory muscles of respiration.  CARDIOVASCULAR: S1, S2 normal. No murmurs, rubs, or gallops.  ABDOMEN: Soft, non-tender, non-distended. Bowel sounds present. No organomegaly or mass.  NEUROLOGIC: Moves all 4 extremities. PSYCHIATRIC: The patient is alert and awake.  Confused.   SKIN: No obvious rash, lesion, or ulcer.   DATA REVIEW:   CBC No results for input(s): WBC, HGB, HCT, PLT in the last 168 hours.  Chemistries  No results for input(s): NA, K, CL, CO2, GLUCOSE, BUN, CREATININE, CALCIUM, MG, AST, ALT, ALKPHOS, BILITOT in the last 168 hours.  Invalid input(s): GFRCGP  Cardiac Enzymes No results for input(s): TROPONINI in the last 168 hours.  Microbiology Results  Results for orders placed or performed during the hospital encounter of 06/19/17  CULTURE, BLOOD (  ROUTINE X 2) w Reflex to ID Panel     Status: None   Collection Time: 06/20/17 11:15 AM  Result Value Ref Range Status   Specimen Description BLOOD BLOOD RIGHT HAND  Final   Special Requests   Final    BOTTLES DRAWN AEROBIC AND ANAEROBIC Blood Culture adequate volume   Culture   Final    NO GROWTH 5 DAYS Performed at [facility], [address] [place], [place] [address]    Report Status 06/25/2017 FINAL  Final  CULTURE, BLOOD (ROUTINE X 2) w Reflex to ID Panel     Status: None   Collection Time: 06/20/17 11:16 AM  Result Value Ref Range Status   Specimen Description BLOOD LEFT ANTECUBITAL  Final   Special Requests   Final    BOTTLES DRAWN AEROBIC AND ANAEROBIC Blood Culture adequate volume   Culture   Final    NO GROWTH 5 DAYS Performed at [facility], [address], [place], [place] [address]    Report Status 06/25/2017 FINAL  Final    RADIOLOGY:  No results found.  Follow up with PCP in 1 week.  Management plans discussed with the patient, family and they are in agreement.  CODE STATUS:  Code Status  History    Date Active Date Inactive Code Status Order ID Comments User Context   06/20/2017 0046 06/21/2017 2126 DNR [id number]  Lance Coon, MD Inpatient   05/23/2017 1126 05/29/2017 1515 DNR [id number]  Bettey Costa, MD Inpatient   03/15/2017 0256 03/16/2017 1743 DNR [id number]  Lance Coon, MD Inpatient   10/19/2015 1122 10/20/2015 1751 DNR [id number]  Nicholes Mango, MD Inpatient   10/19/2015 1014 10/19/2015 1121 Full Code [id number]  Harvie Bridge, DO Inpatient   03/03/2015 0127 03/05/2015 1702 Full Code [id number]  Lance Coon, MD Inpatient    Questions for Most Recent Historical Code Status (Order [id number])    Question Answer Comment   In the event of cardiac or respiratory ARREST Do not call a "code blue"    In the event of cardiac or respiratory ARREST Do not perform Intubation, CPR, defibrillation or ACLS    In the event of cardiac or respiratory ARREST Use medication by any route, position, wound care, and other measures to relive pain and suffering. May use oxygen, suction and manual treatment of airway obstruction as needed for comfort.    Comments RN may pronounce         Advance Directive Documentation     Most Recent Value  Type of Advance Directive  Out of facility DNR (pink MOST or yellow form), Living will  Pre-existing out of facility DNR order (yellow form or pink MOST form)  Yellow form placed in chart (order not valid for inpatient use)  "MOST" Form in Place?  -      TOTAL TIME TAKING CARE OF THIS PATIENT ON DAY OF DISCHARGE: more than 30 minutes.   Ariana Miller M.D on 06/28/2017 at 11:02 AM  Between 7am to 6pm - Pager - [phone]  After 6pm go to [email or web address] - password [email or web address] [facility]  Office  [phone]  CC: Primary care physician; Rusty Aus, MD  Note: This dictation was prepared with Dragon dictation along with smaller phrase technology. Any transcriptional errors that result from this process are unintentional.

## 2017-07-20 ENCOUNTER — Ambulatory Visit: Payer: Medicare Other | Admitting: Cardiovascular Disease

## 2017-09-19 ENCOUNTER — Other Ambulatory Visit: Admitting: Urology

## 2017-09-19 ENCOUNTER — Encounter: Payer: Self-pay | Admitting: Urology

## 2017-10-28 IMAGING — CT CT HEAD W/O CM
3 series · 16 of 45 positions shown, 19 images · non-contrast
Comparison: 10/19/2015

CLINICAL DATA: Follow-up examination from of on recent CT

EXAM:
CT HEAD WITHOUT CONTRAST
TECHNIQUE: Contiguous axial images were obtained from the base of the skull
through the vertex without intravenous contrast.

[Series 2: head wo · axial · 0.39mm/px · z∈[+1020,+1135]mm · 10 of 28 slices shown, 13 images]
[im 3/28  brain]
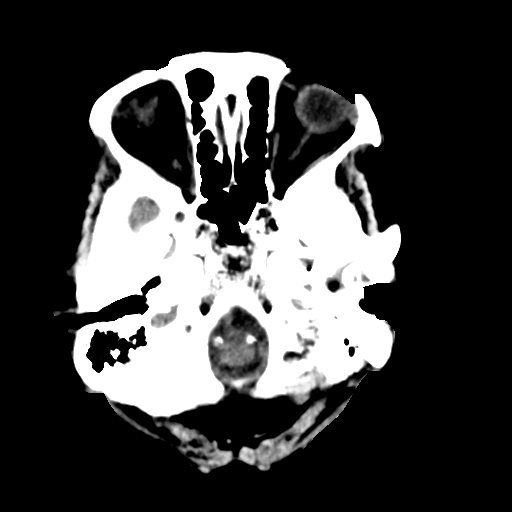
[im 3/28  bone]
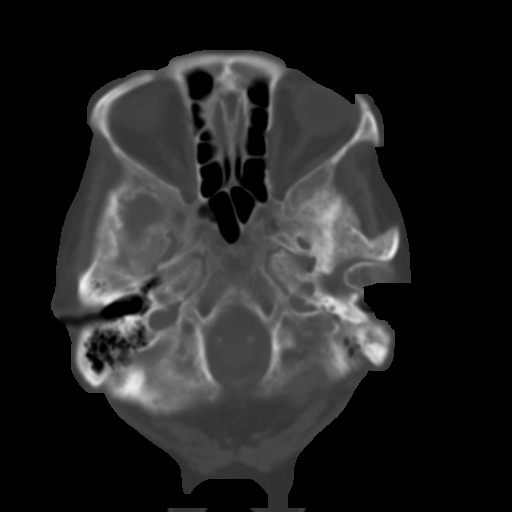
[im 5/28  brain]
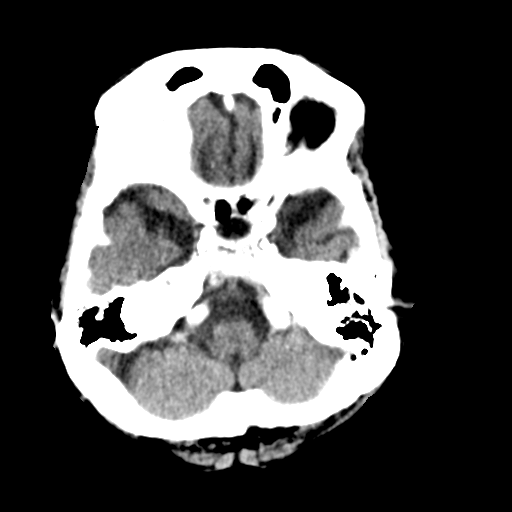
[im 8/28  brain]
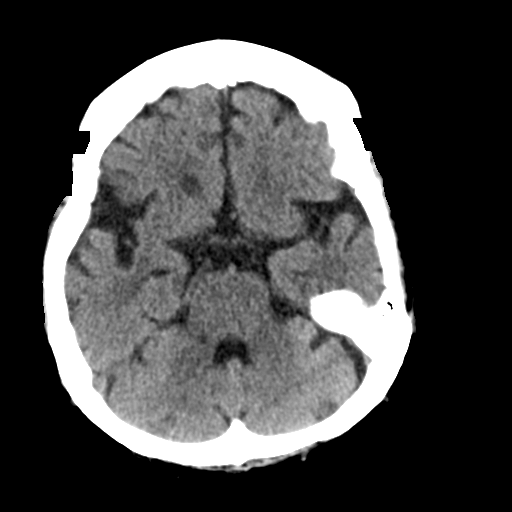
[im 11/28  brain]
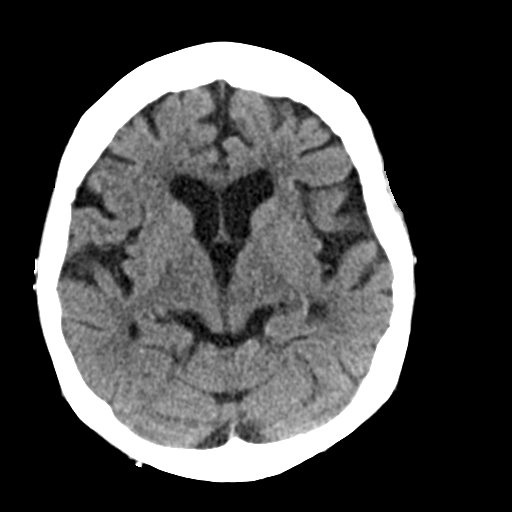
[im 13/28  brain]
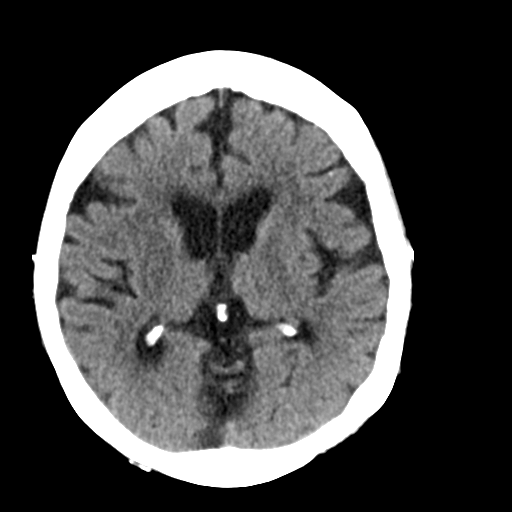
[im 13/28  bone]
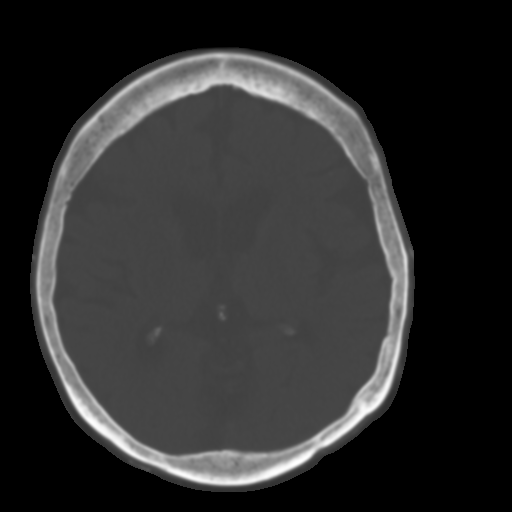
[im 16/28  brain]
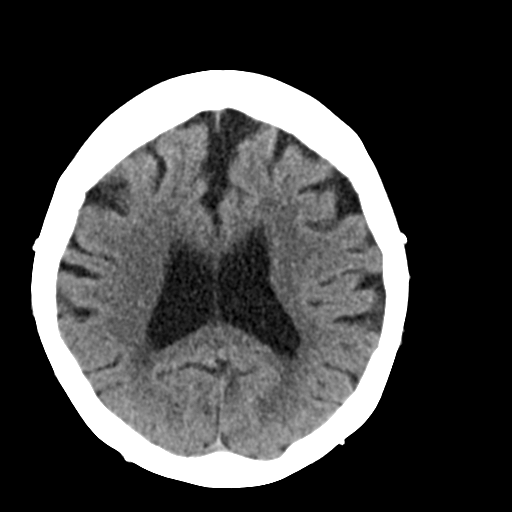
[im 18/28  brain]
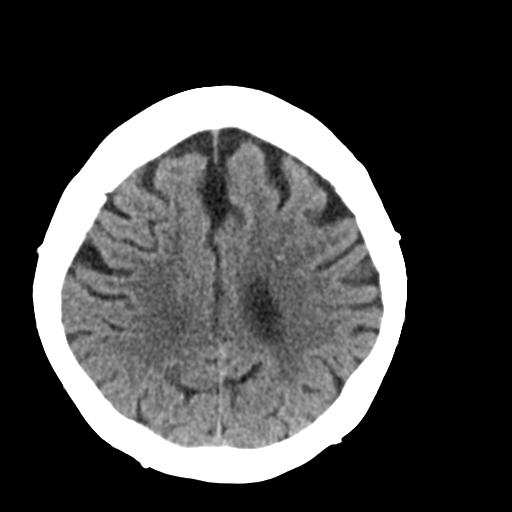
[im 21/28  brain]
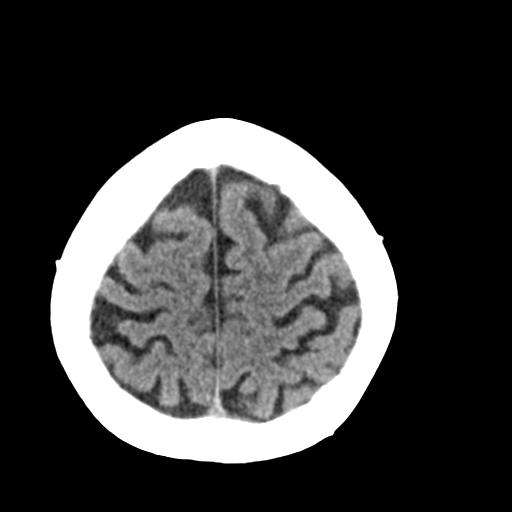
[im 24/28  brain]
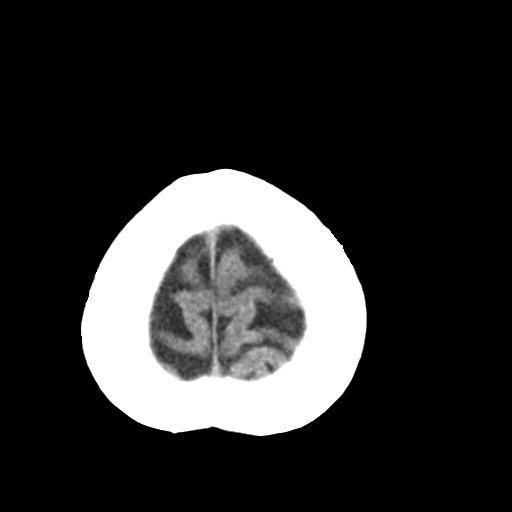
[im 24/28  bone]
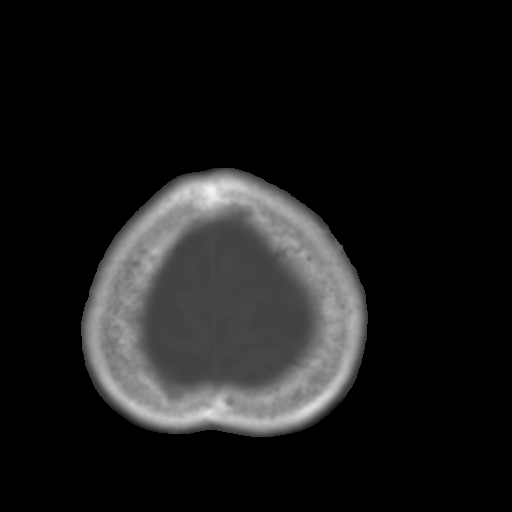
[im 26/28  brain]
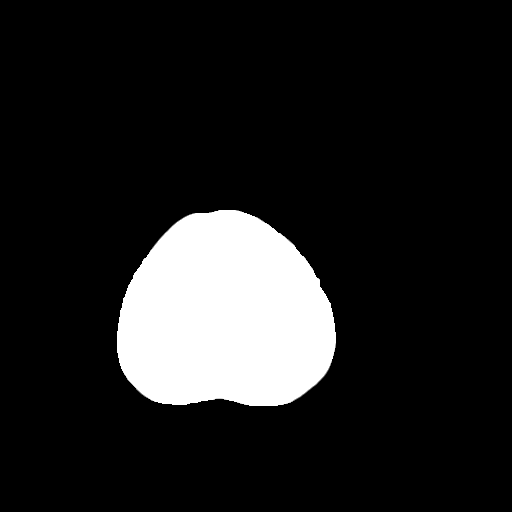

[Series 4: coronal soft tissue · coronal · 0.28mm/px · 3 of 58 slices shown]
[im 20/58  brain]
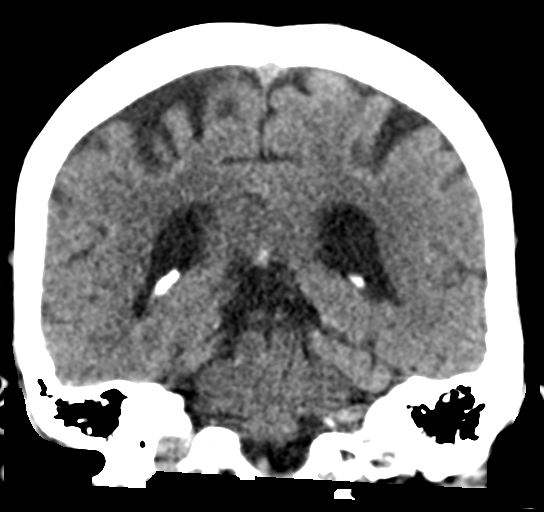
[im 26/58  brain]
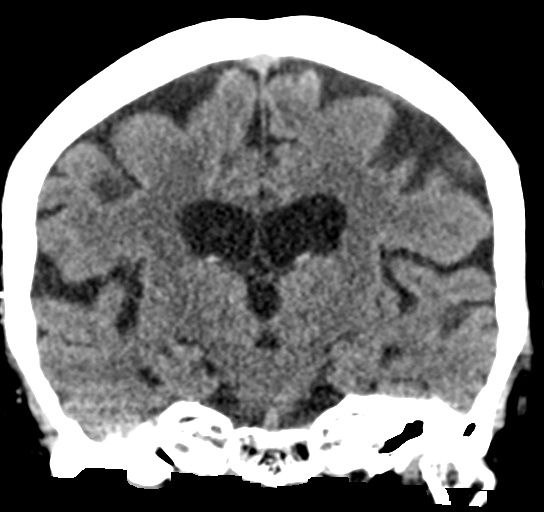
[im 32/58  brain]
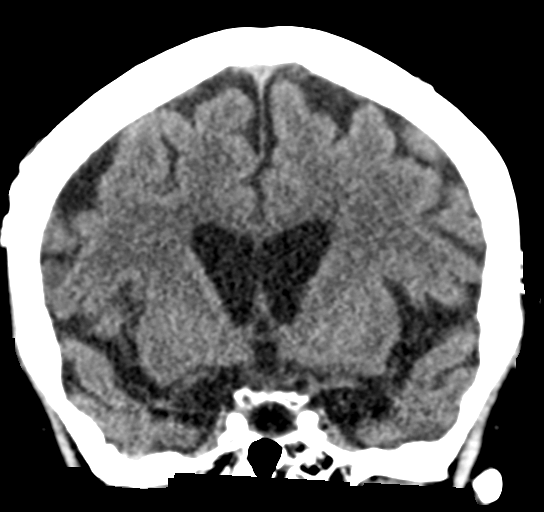

[Series 5: sagittal soft tissue · sagittal · 0.28mm/px · 3 of 52 slices shown]
[im 18/52  brain]
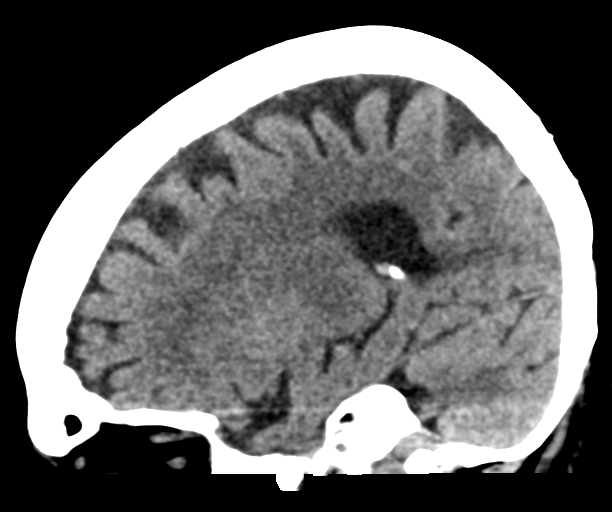
[im 26/52  brain]
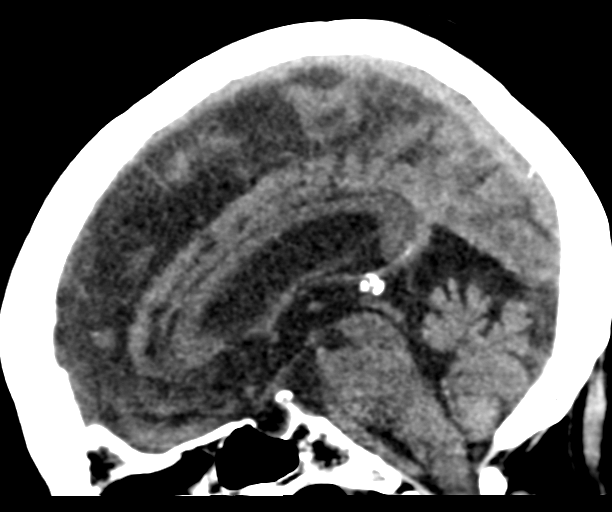
[im 35/52  brain]
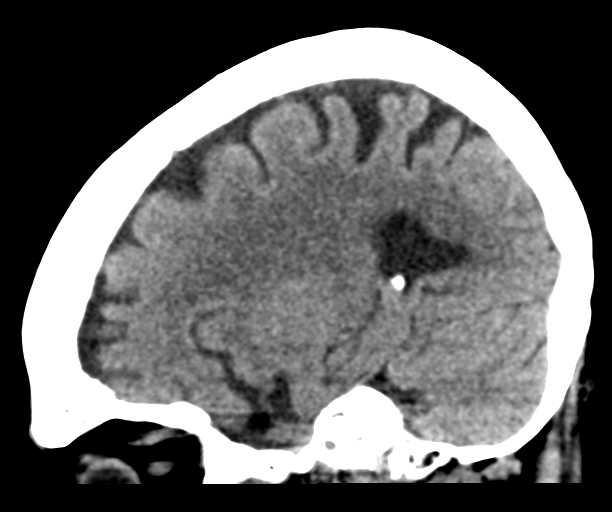

[16 of 45 positions shown; findings below may reference images not displayed]

FINDINGS: Brain: Straight able chronic white matter ischemic change in
atrophic change is noted. The previously seen hyperdensity within
the deep white matter on the left is again identified and stable in
appearance. No new focal area of increased attenuation is noted. No
new acute hemorrhage is seen.

Vascular: No hyperdense vessel or unexpected calcification.

Skull: Normal. Negative for fracture or focal lesion.

Sinuses/Orbits: No acute finding.

Other: None.
IMPRESSION: Stable small hyperdensity within the posterior left frontal lobe no
new focal abnormality is seen.

Stable atrophic and chronic white matter ischemic changes.

## 2018-04-04 DEATH — deceased

## 2019-03-21 IMAGING — DX DG CHEST 1V PORT
1 series · 1 of 1 positions shown · non-contrast
Comparison: 09/20/2015

CLINICAL DATA: Shortness of breath and cough

EXAM:
PORTABLE CHEST 1 VIEW

[chest ap]
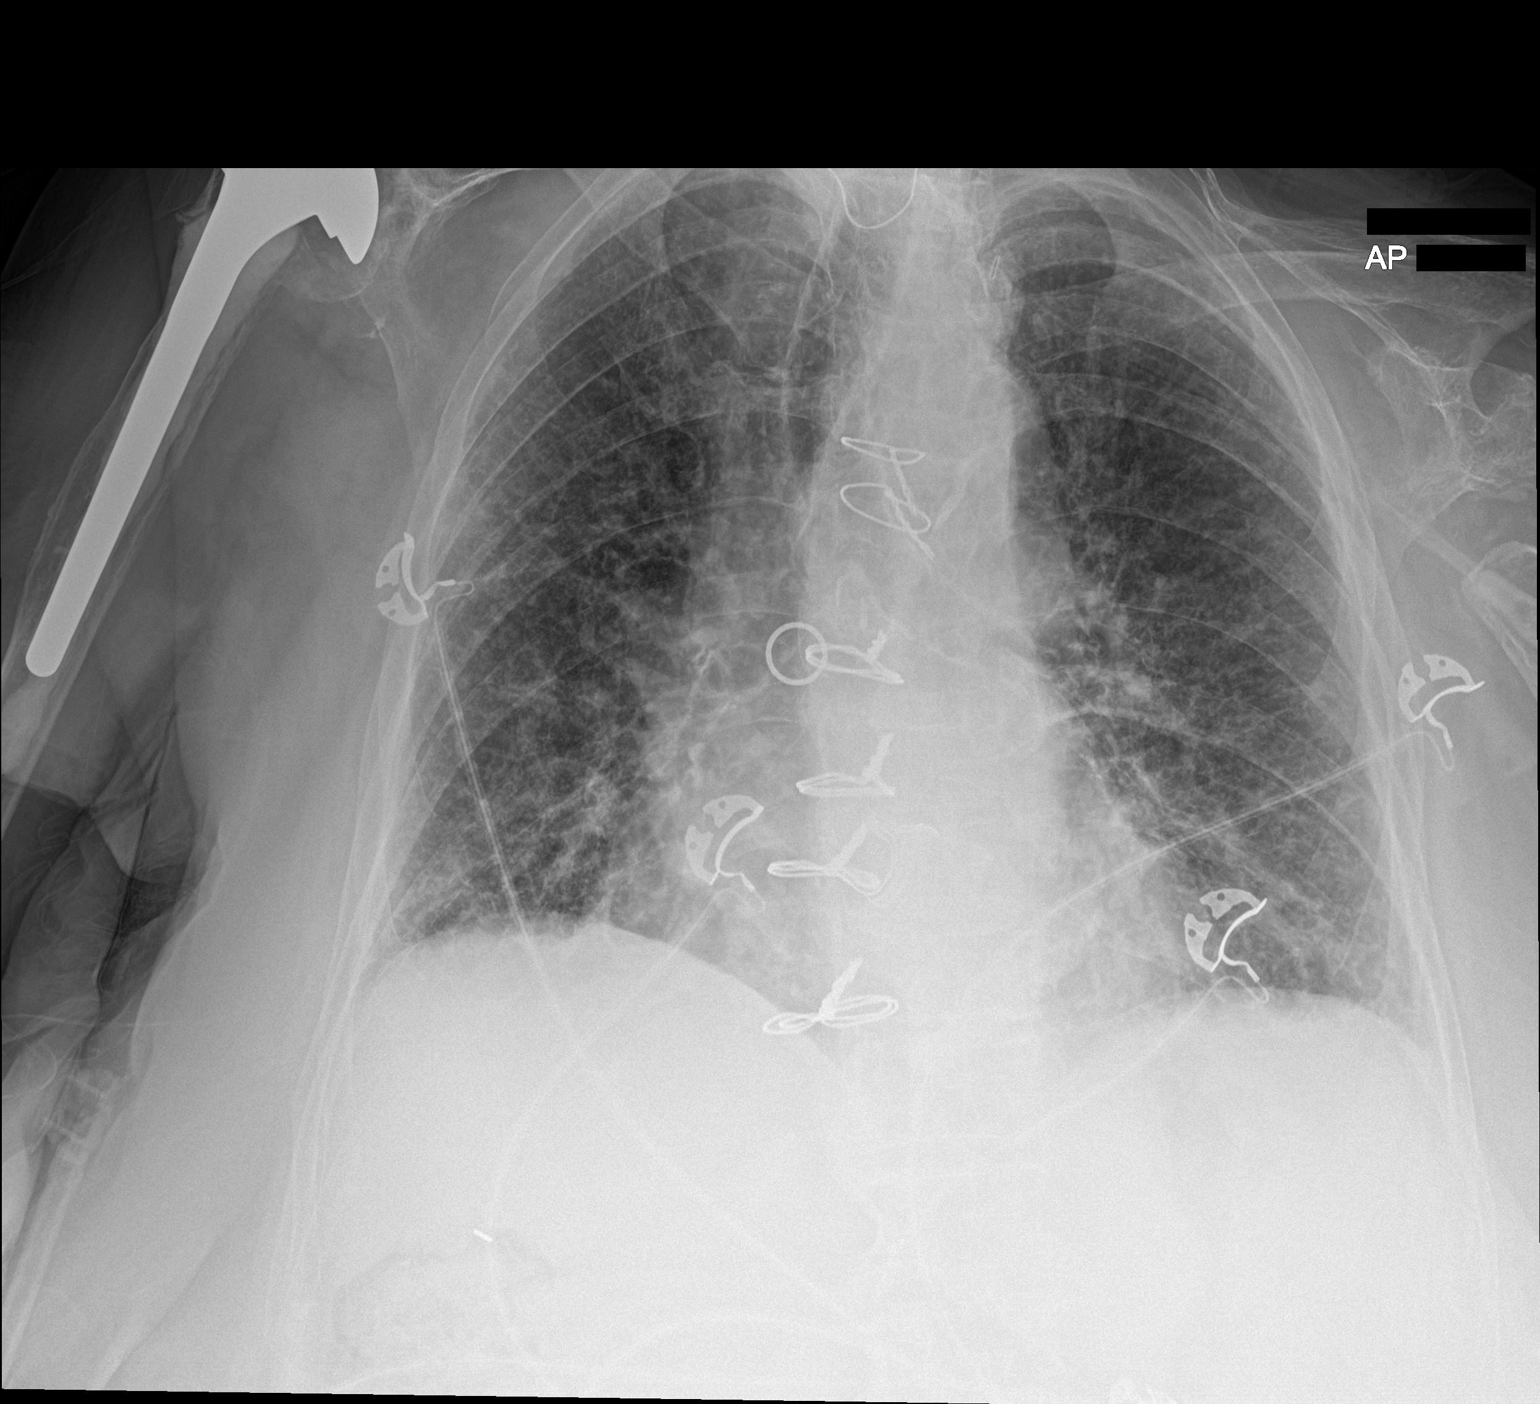

[1 of 1 positions shown; findings below may reference images not displayed]

FINDINGS: Cardiac shadow is within normal limits. Postoperative changes are
again seen. The lungs are well aerated bilaterally with mild
interstitial changes stable from the prior exam. No focal infiltrate
or sizable effusion is seen. No bony abnormality is noted.
IMPRESSION: No acute abnormality seen

## 2019-03-23 IMAGING — CR DG CHEST 2V
2 series · 2 of 2 positions shown · non-contrast
Comparison: 03/12/2017

CLINICAL DATA: Shortness of breath and fevers

EXAM:
CHEST - 2 VIEW

[chest pa]
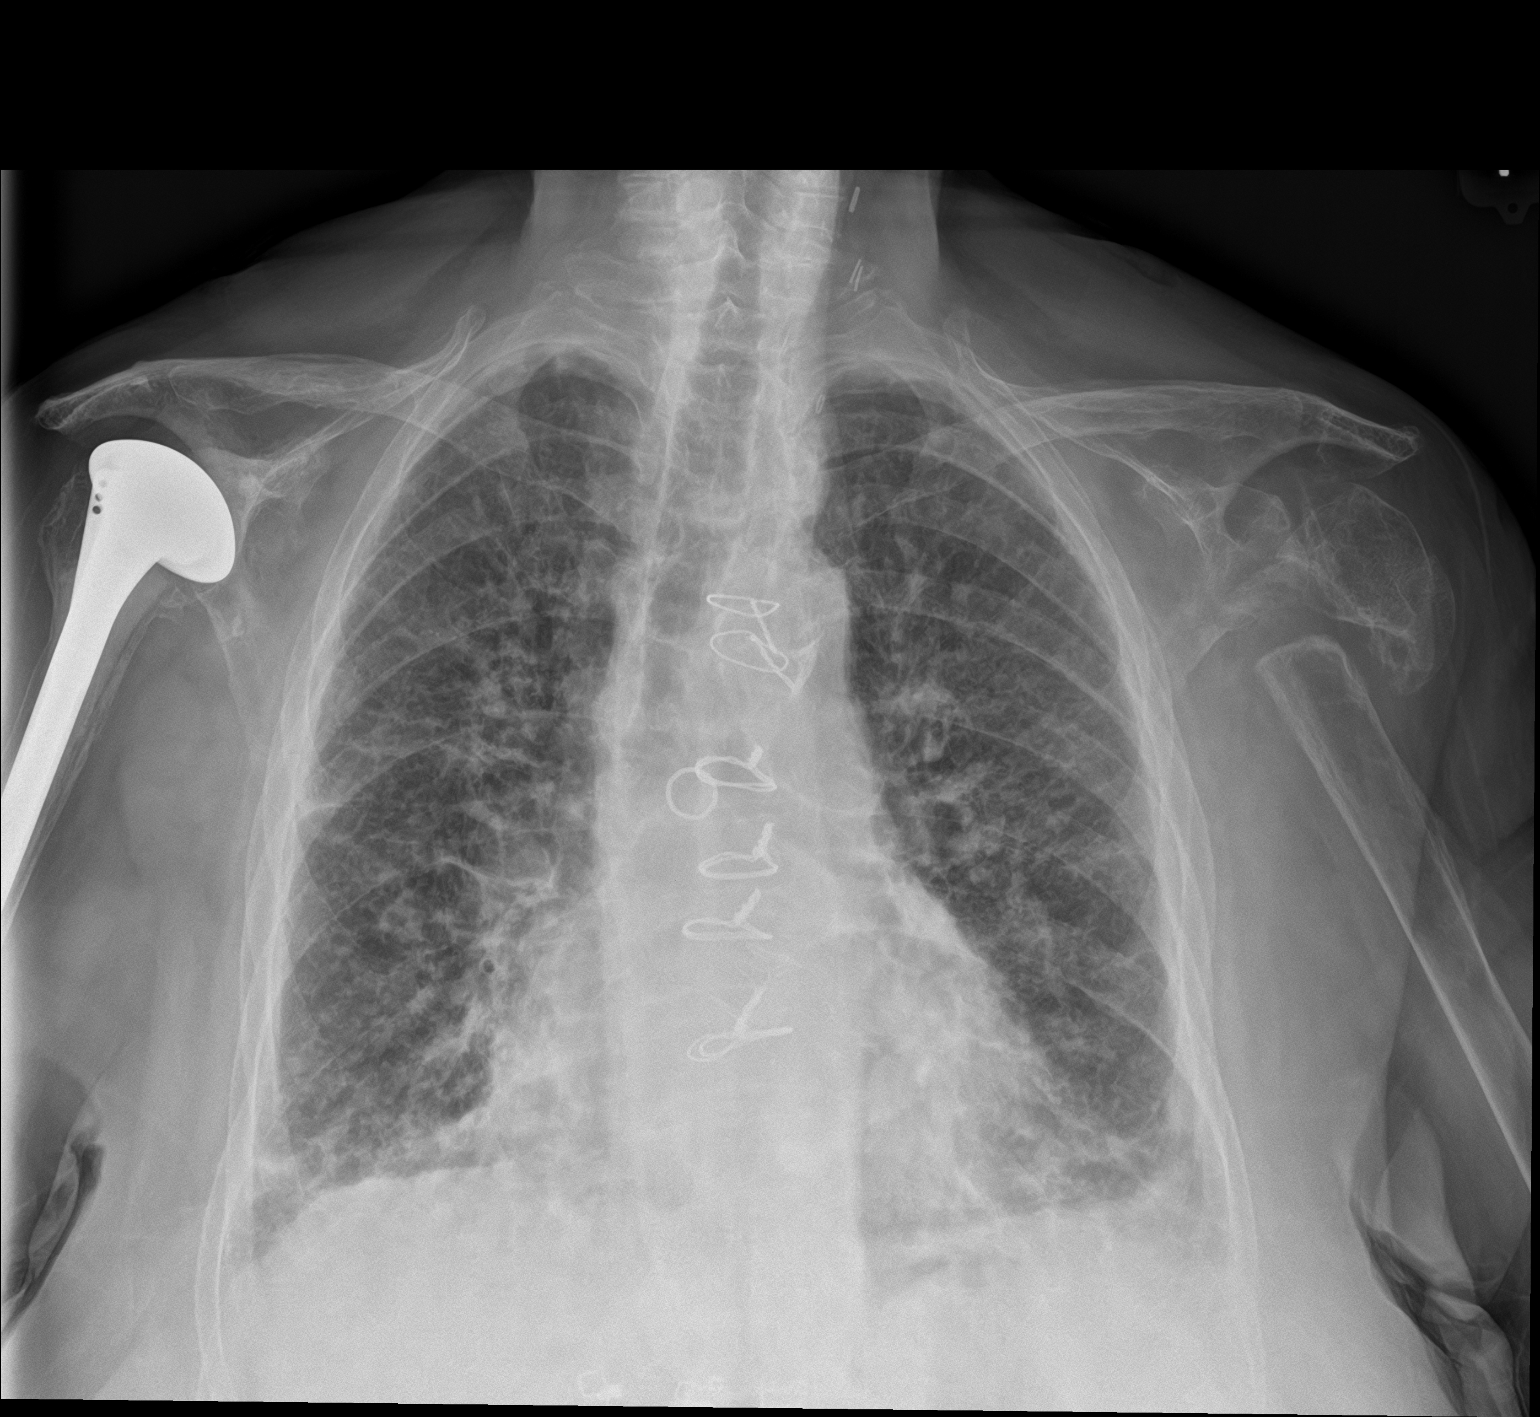

[chest lat]
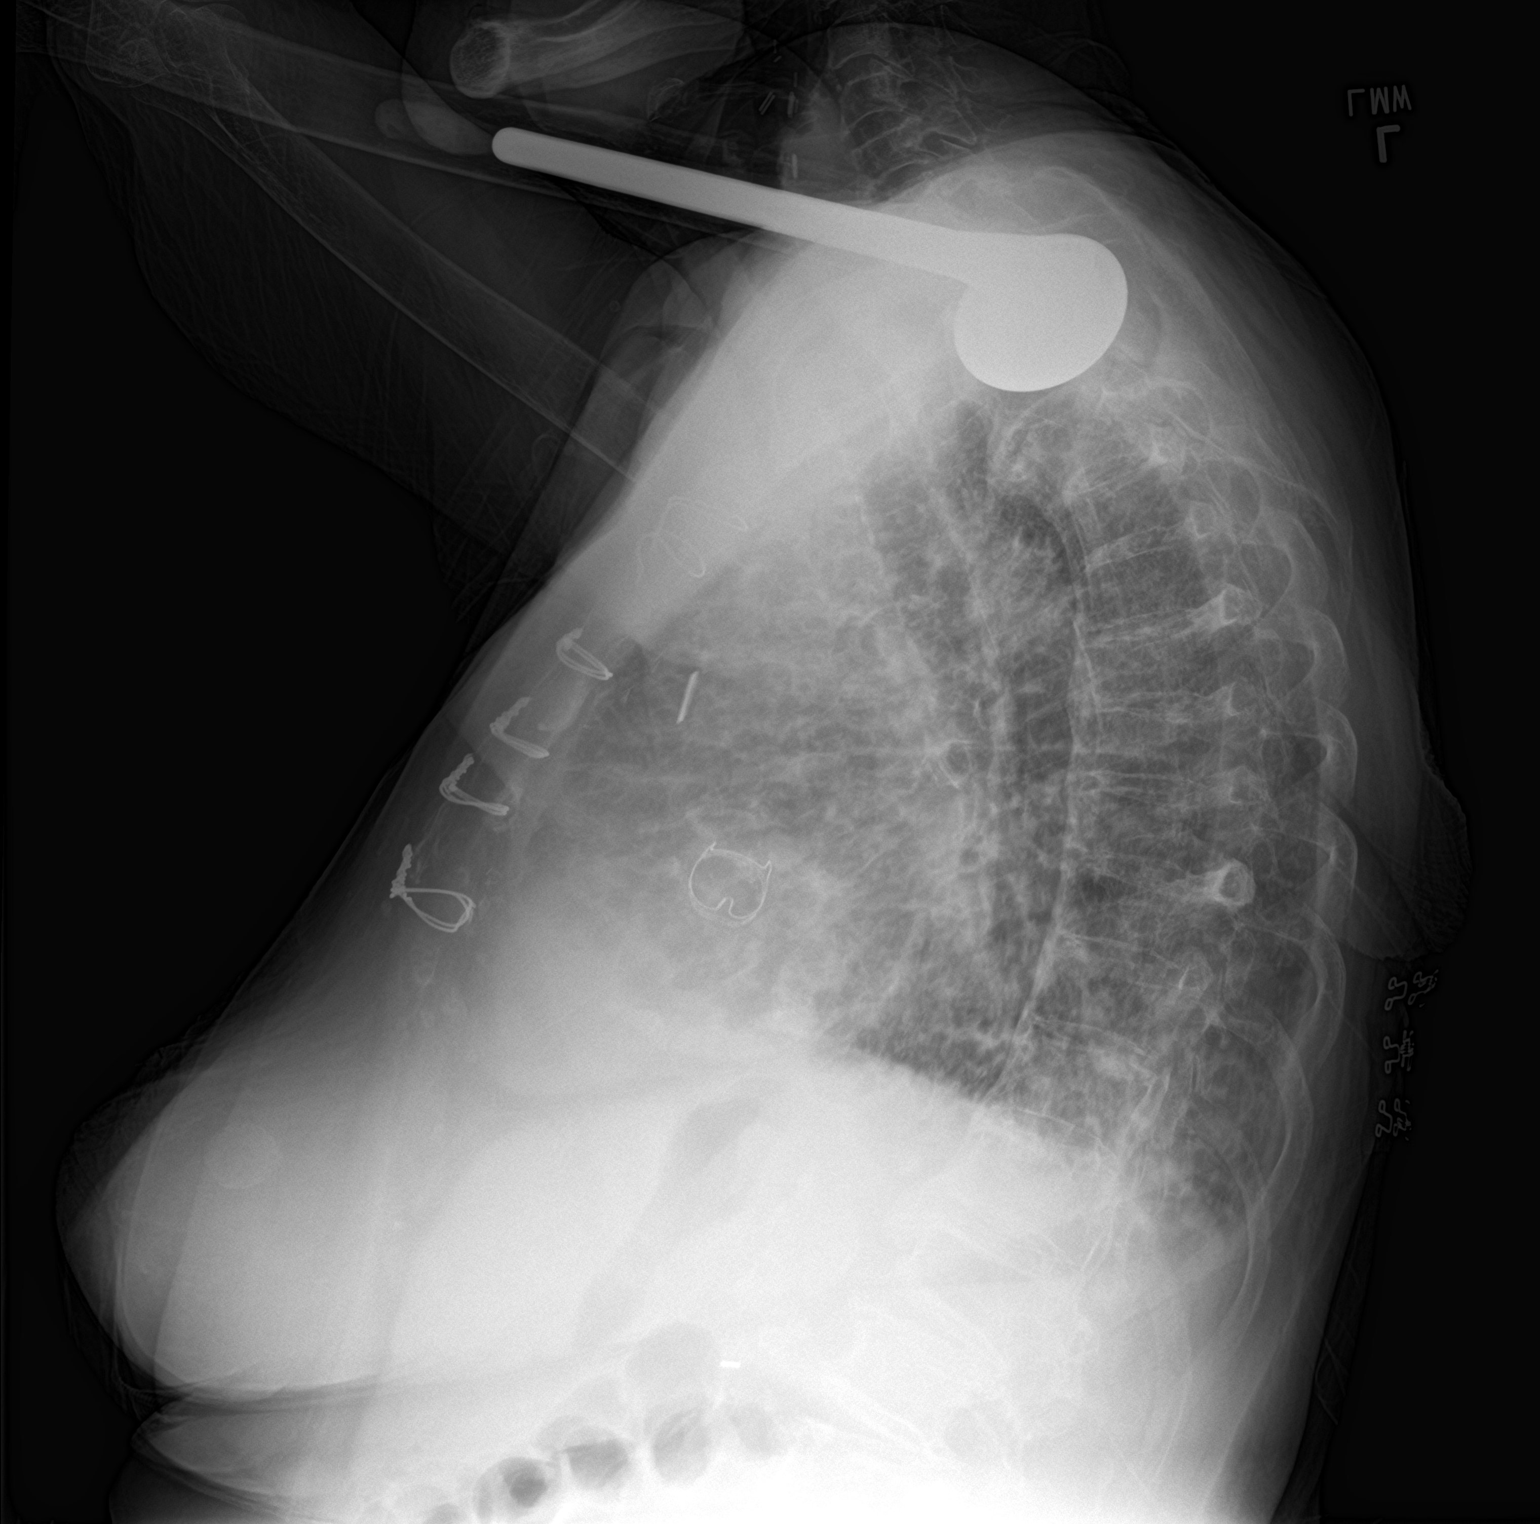

[2 of 2 positions shown; findings below may reference images not displayed]

FINDINGS: Cardiac shadow is stable. Increasing vascular congestion is noted
with some interstitial edema new from the prior exam. Mild bibasilar
atelectatic changes are seen. Chronic changes in the shoulders are
noted bilaterally. Postsurgical changes are again noted.
IMPRESSION: Increasing vascular congestion with interstitial edema
